# Patient Record
Sex: Female | Born: 1935
Health system: Southern US, Community
[De-identification: ages and names within clinical notes are randomized; demographics above are authoritative.]

## PROBLEM LIST (undated history)

## (undated) ENCOUNTER — Emergency Department (HOSPITAL_BASED_OUTPATIENT_CLINIC_OR_DEPARTMENT_OTHER): Admission: EM | Payer: PRIVATE HEALTH INSURANCE | Source: Home / Self Care

## (undated) DIAGNOSIS — F4321 Adjustment disorder with depressed mood: Secondary | ICD-10-CM

## (undated) DIAGNOSIS — I351 Nonrheumatic aortic (valve) insufficiency: Secondary | ICD-10-CM

## (undated) DIAGNOSIS — N183 Chronic kidney disease, stage 3 unspecified: Secondary | ICD-10-CM

## (undated) DIAGNOSIS — Z9079 Acquired absence of other genital organ(s): Secondary | ICD-10-CM

## (undated) DIAGNOSIS — I10 Essential (primary) hypertension: Secondary | ICD-10-CM

## (undated) DIAGNOSIS — C50919 Malignant neoplasm of unspecified site of unspecified female breast: Secondary | ICD-10-CM

## (undated) DIAGNOSIS — H409 Unspecified glaucoma: Secondary | ICD-10-CM

## (undated) DIAGNOSIS — M810 Age-related osteoporosis without current pathological fracture: Secondary | ICD-10-CM

## (undated) DIAGNOSIS — E785 Hyperlipidemia, unspecified: Secondary | ICD-10-CM

## (undated) DIAGNOSIS — Z9889 Other specified postprocedural states: Secondary | ICD-10-CM

## (undated) DIAGNOSIS — M7120 Synovial cyst of popliteal space [Baker], unspecified knee: Secondary | ICD-10-CM

## (undated) HISTORY — DX: Synovial cyst of popliteal space (Baker), unspecified knee: M71.20

## (undated) HISTORY — DX: Essential (primary) hypertension: I10

## (undated) HISTORY — DX: Adjustment disorder with depressed mood: F43.21

## (undated) HISTORY — DX: Malignant neoplasm of unspecified site of unspecified female breast: C50.919

## (undated) HISTORY — DX: Hyperlipidemia, unspecified: E78.5

## (undated) HISTORY — DX: Chronic kidney disease, stage 3 unspecified: N18.30

## (undated) HISTORY — PX: ABDOMINAL HYSTERECTOMY: SHX81

## (undated) HISTORY — PX: BLADDER REPAIR: SHX76

## (undated) HISTORY — DX: Acquired absence of other genital organ(s): Z90.79

## (undated) HISTORY — DX: Unspecified glaucoma: H40.9

## (undated) HISTORY — DX: Other specified postprocedural states: Z98.890

## (undated) HISTORY — DX: Age-related osteoporosis without current pathological fracture: M81.0

## (undated) HISTORY — DX: Nonrheumatic aortic (valve) insufficiency: I35.1

---

## 1991-09-29 DIAGNOSIS — C50919 Malignant neoplasm of unspecified site of unspecified female breast: Secondary | ICD-10-CM

## 1991-09-29 HISTORY — DX: Malignant neoplasm of unspecified site of unspecified female breast: C50.919

## 1991-09-29 HISTORY — PX: BREAST SURGERY: SHX581

## 1998-10-12 ENCOUNTER — Emergency Department (HOSPITAL_COMMUNITY): Admission: EM | Admit: 1998-10-12 | Discharge: 1998-10-12 | Payer: Self-pay | Admitting: Endocrinology

## 1998-10-12 ENCOUNTER — Encounter: Payer: Self-pay | Admitting: Endocrinology

## 2004-09-08 ENCOUNTER — Ambulatory Visit: Payer: Self-pay | Admitting: Internal Medicine

## 2004-09-09 ENCOUNTER — Ambulatory Visit: Payer: Self-pay | Admitting: Internal Medicine

## 2004-10-03 ENCOUNTER — Ambulatory Visit: Payer: Self-pay | Admitting: Internal Medicine

## 2004-10-13 ENCOUNTER — Ambulatory Visit: Payer: Self-pay | Admitting: Internal Medicine

## 2005-01-12 ENCOUNTER — Ambulatory Visit: Payer: Self-pay | Admitting: Internal Medicine

## 2005-10-27 ENCOUNTER — Ambulatory Visit: Payer: Self-pay | Admitting: Internal Medicine

## 2006-11-03 ENCOUNTER — Ambulatory Visit: Payer: Self-pay | Admitting: Internal Medicine

## 2006-11-03 LAB — CONVERTED CEMR LAB
ALT: 16 units/L (ref 0–40)
AST: 24 units/L (ref 0–37)
BUN: 13 mg/dL (ref 6–23)
Cholesterol: 219 mg/dL (ref 0–200)
Creatinine, Ser: 0.8 mg/dL (ref 0.4–1.2)
Direct LDL: 137.2 mg/dL
Ferritin: 42.7 ng/mL (ref 10.0–291.0)
Glucose, Bld: 107 mg/dL — ABNORMAL HIGH (ref 70–99)
HDL: 63.8 mg/dL (ref >39.0)
Iron: 79 ug/dL (ref 42–145)
Potassium: 4.2 meq/L (ref 3.5–5.1)
Total CHOL/HDL Ratio: 3.4
Triglycerides: 84 mg/dL (ref 0–149)
VLDL: 17 mg/dL (ref 0–40)

## 2006-11-10 ENCOUNTER — Ambulatory Visit: Payer: Self-pay | Admitting: Internal Medicine

## 2006-11-19 ENCOUNTER — Ambulatory Visit: Payer: Self-pay | Admitting: Internal Medicine

## 2006-12-28 ENCOUNTER — Ambulatory Visit: Payer: Self-pay | Admitting: Internal Medicine

## 2006-12-28 LAB — CONVERTED CEMR LAB
ALT: 12 units/L (ref 0–40)
AST: 18 units/L (ref 0–37)
Cholesterol: 252 mg/dL (ref 0–200)
Direct LDL: 175.6 mg/dL
HDL: 56.8 mg/dL (ref >39.0)
Total CHOL/HDL Ratio: 4.4
Triglycerides: 93 mg/dL (ref 0–149)
VLDL: 19 mg/dL (ref 0–40)

## 2007-01-10 ENCOUNTER — Ambulatory Visit: Payer: Self-pay | Admitting: Internal Medicine

## 2007-02-14 ENCOUNTER — Ambulatory Visit: Payer: Self-pay | Admitting: Internal Medicine

## 2007-02-14 LAB — CONVERTED CEMR LAB
ALT: 16 units/L (ref 0–40)
AST: 19 units/L (ref 0–37)
Albumin: 3.5 g/dL (ref 3.5–5.2)
Alkaline Phosphatase: 87 units/L (ref 39–117)
Bilirubin, Direct: 0.1 mg/dL (ref 0.0–0.3)
Cholesterol: 203 mg/dL (ref 0–200)
Direct LDL: 117.7 mg/dL
HDL: 58.2 mg/dL (ref >39.0)
Total Bilirubin: 0.9 mg/dL (ref 0.3–1.2)
Total CHOL/HDL Ratio: 3.5
Total Protein: 6.9 g/dL (ref 6.0–8.3)
Triglycerides: 102 mg/dL (ref 0–149)
VLDL: 20 mg/dL (ref 0–40)

## 2007-06-07 ENCOUNTER — Encounter: Payer: Self-pay | Admitting: Internal Medicine

## 2007-06-07 DIAGNOSIS — Z9889 Other specified postprocedural states: Secondary | ICD-10-CM

## 2007-06-07 DIAGNOSIS — H409 Unspecified glaucoma: Secondary | ICD-10-CM | POA: Insufficient documentation

## 2007-06-07 DIAGNOSIS — Z9079 Acquired absence of other genital organ(s): Secondary | ICD-10-CM | POA: Insufficient documentation

## 2007-06-07 DIAGNOSIS — E785 Hyperlipidemia, unspecified: Secondary | ICD-10-CM

## 2007-06-07 DIAGNOSIS — I10 Essential (primary) hypertension: Secondary | ICD-10-CM

## 2007-06-07 DIAGNOSIS — C50919 Malignant neoplasm of unspecified site of unspecified female breast: Secondary | ICD-10-CM | POA: Insufficient documentation

## 2007-06-07 HISTORY — DX: Acquired absence of other genital organ(s): Z90.79

## 2007-06-07 HISTORY — DX: Essential (primary) hypertension: I10

## 2007-06-07 HISTORY — DX: Other specified postprocedural states: Z98.890

## 2007-06-07 HISTORY — DX: Hyperlipidemia, unspecified: E78.5

## 2007-06-07 HISTORY — DX: Unspecified glaucoma: H40.9

## 2007-06-30 ENCOUNTER — Ambulatory Visit: Payer: Self-pay | Admitting: Vascular Surgery

## 2007-06-30 ENCOUNTER — Emergency Department (HOSPITAL_COMMUNITY): Admission: EM | Admit: 2007-06-30 | Discharge: 2007-06-30 | Payer: Self-pay | Admitting: Emergency Medicine

## 2007-09-02 ENCOUNTER — Encounter: Payer: Self-pay | Admitting: Internal Medicine

## 2007-09-08 ENCOUNTER — Encounter: Payer: Self-pay | Admitting: Internal Medicine

## 2008-03-23 ENCOUNTER — Telehealth: Payer: Self-pay | Admitting: Internal Medicine

## 2008-03-23 ENCOUNTER — Encounter: Payer: Self-pay | Admitting: Internal Medicine

## 2008-04-02 ENCOUNTER — Telehealth: Payer: Self-pay | Admitting: Internal Medicine

## 2008-04-12 ENCOUNTER — Ambulatory Visit: Payer: Self-pay | Admitting: Internal Medicine

## 2008-04-12 DIAGNOSIS — H6120 Impacted cerumen, unspecified ear: Secondary | ICD-10-CM

## 2008-04-12 LAB — CONVERTED CEMR LAB
ALT: 18 units/L (ref 0–35)
AST: 26 units/L (ref 0–37)
Albumin: 3.9 g/dL (ref 3.5–5.2)
Alkaline Phosphatase: 93 units/L (ref 39–117)
BUN: 9 mg/dL (ref 6–23)
Basophils Absolute: 0 10*3/uL (ref 0.0–0.1)
Basophils Relative: 0.6 % (ref 0.0–3.0)
Bilirubin, Direct: 0.1 mg/dL (ref 0.0–0.3)
CO2: 28 meq/L (ref 19–32)
Calcium: 9.3 mg/dL (ref 8.4–10.5)
Chloride: 102 meq/L (ref 96–112)
Cholesterol: 167 mg/dL (ref 0–200)
Creatinine, Ser: 0.8 mg/dL (ref 0.4–1.2)
Eosinophils Absolute: 0.2 10*3/uL (ref 0.0–0.7)
Eosinophils Relative: 2.7 % (ref 0.0–5.0)
GFR calc Af Amer: 89 mL/min
GFR calc non Af Amer: 74 mL/min
Glucose, Bld: 101 mg/dL — ABNORMAL HIGH (ref 70–99)
HCT: 38.3 % (ref 36.0–46.0)
HDL: 59.4 mg/dL (ref >39.0)
Hemoglobin: 12.9 g/dL (ref 12.0–15.0)
LDL Cholesterol: 94 mg/dL (ref 0–99)
Lymphocytes Relative: 28.9 % (ref 12.0–46.0)
MCHC: 33.6 g/dL (ref 30.0–36.0)
MCV: 94.6 fL (ref 78.0–100.0)
Monocytes Absolute: 0.6 10*3/uL (ref 0.1–1.0)
Monocytes Relative: 8.3 % (ref 3.0–12.0)
Neutro Abs: 4.7 10*3/uL (ref 1.4–7.7)
Neutrophils Relative %: 59.5 % (ref 43.0–77.0)
Platelets: 209 10*3/uL (ref 150–400)
Potassium: 3.6 meq/L (ref 3.5–5.1)
RBC: 4.05 M/uL (ref 3.87–5.11)
RDW: 12.2 % (ref 11.5–14.6)
Sodium: 141 meq/L (ref 135–145)
TSH: 2.99 microintl units/mL (ref 0.35–5.50)
Total Bilirubin: 1.3 mg/dL — ABNORMAL HIGH (ref 0.3–1.2)
Total CHOL/HDL Ratio: 2.8
Total Protein: 7.1 g/dL (ref 6.0–8.3)
Triglycerides: 68 mg/dL (ref 0–149)
VLDL: 14 mg/dL (ref 0–40)
WBC: 7.7 10*3/uL (ref 4.5–10.5)

## 2008-04-15 ENCOUNTER — Encounter: Payer: Self-pay | Admitting: Internal Medicine

## 2008-06-07 ENCOUNTER — Encounter: Payer: Self-pay | Admitting: Internal Medicine

## 2008-06-07 ENCOUNTER — Ambulatory Visit: Payer: Self-pay | Admitting: Internal Medicine

## 2008-06-25 ENCOUNTER — Ambulatory Visit: Payer: Self-pay | Admitting: Internal Medicine

## 2008-07-19 ENCOUNTER — Telehealth: Payer: Self-pay | Admitting: Internal Medicine

## 2008-09-03 ENCOUNTER — Encounter: Payer: Self-pay | Admitting: Internal Medicine

## 2008-10-22 ENCOUNTER — Ambulatory Visit: Payer: Self-pay | Admitting: Internal Medicine

## 2008-10-22 LAB — CONVERTED CEMR LAB
ALT: 16 units/L (ref 0–35)
AST: 20 units/L (ref 0–37)
Albumin: 3.4 g/dL — ABNORMAL LOW (ref 3.5–5.2)
Alkaline Phosphatase: 96 units/L (ref 39–117)
BUN: 12 mg/dL (ref 6–23)
Bilirubin, Direct: 0.1 mg/dL (ref 0.0–0.3)
CO2: 32 meq/L (ref 19–32)
Calcium: 9.2 mg/dL (ref 8.4–10.5)
Chloride: 109 meq/L (ref 96–112)
Cholesterol: 176 mg/dL (ref 0–200)
Creatinine, Ser: 0.7 mg/dL (ref 0.4–1.2)
GFR calc Af Amer: 104 mL/min
GFR calc non Af Amer: 86 mL/min
Glucose, Bld: 104 mg/dL — ABNORMAL HIGH (ref 70–99)
HDL: 56.6 mg/dL (ref >39.0)
LDL Cholesterol: 105 mg/dL — ABNORMAL HIGH (ref 0–99)
Potassium: 4.2 meq/L (ref 3.5–5.1)
Sodium: 144 meq/L (ref 135–145)
Total Bilirubin: 1.1 mg/dL (ref 0.3–1.2)
Total CHOL/HDL Ratio: 3.1
Total Protein: 6.7 g/dL (ref 6.0–8.3)
Triglycerides: 73 mg/dL (ref 0–149)
VLDL: 15 mg/dL (ref 0–40)

## 2008-11-01 ENCOUNTER — Telehealth: Payer: Self-pay | Admitting: Internal Medicine

## 2008-11-05 ENCOUNTER — Telehealth: Payer: Self-pay | Admitting: Internal Medicine

## 2008-11-09 ENCOUNTER — Telehealth: Payer: Self-pay | Admitting: Internal Medicine

## 2008-11-10 ENCOUNTER — Encounter: Payer: Self-pay | Admitting: Internal Medicine

## 2009-03-25 ENCOUNTER — Telehealth: Payer: Self-pay | Admitting: Internal Medicine

## 2009-03-26 ENCOUNTER — Ambulatory Visit: Payer: Self-pay | Admitting: Internal Medicine

## 2009-03-26 DIAGNOSIS — F4321 Adjustment disorder with depressed mood: Secondary | ICD-10-CM

## 2009-03-26 HISTORY — DX: Adjustment disorder with depressed mood: F43.21

## 2009-04-05 ENCOUNTER — Ambulatory Visit: Payer: Self-pay | Admitting: Licensed Clinical Social Worker

## 2009-04-19 ENCOUNTER — Ambulatory Visit: Payer: Self-pay | Admitting: Licensed Clinical Social Worker

## 2009-04-29 ENCOUNTER — Ambulatory Visit: Payer: Self-pay | Admitting: Internal Medicine

## 2009-05-17 ENCOUNTER — Ambulatory Visit: Payer: Self-pay | Admitting: Licensed Clinical Social Worker

## 2009-06-07 ENCOUNTER — Encounter: Payer: Self-pay | Admitting: Internal Medicine

## 2009-07-12 ENCOUNTER — Ambulatory Visit: Payer: Self-pay | Admitting: Licensed Clinical Social Worker

## 2009-07-29 ENCOUNTER — Ambulatory Visit: Payer: Self-pay | Admitting: Internal Medicine

## 2009-09-23 ENCOUNTER — Telehealth (INDEPENDENT_AMBULATORY_CARE_PROVIDER_SITE_OTHER): Payer: Self-pay | Admitting: *Deleted

## 2009-09-28 HISTORY — PX: EYE SURGERY: SHX253

## 2009-10-02 ENCOUNTER — Ambulatory Visit: Payer: Self-pay | Admitting: Internal Medicine

## 2009-10-21 ENCOUNTER — Telehealth: Payer: Self-pay | Admitting: Internal Medicine

## 2009-11-04 ENCOUNTER — Telehealth: Payer: Self-pay | Admitting: Internal Medicine

## 2010-01-30 ENCOUNTER — Telehealth (INDEPENDENT_AMBULATORY_CARE_PROVIDER_SITE_OTHER): Payer: Self-pay | Admitting: *Deleted

## 2010-02-04 ENCOUNTER — Ambulatory Visit: Payer: Self-pay | Admitting: Internal Medicine

## 2010-02-04 LAB — CONVERTED CEMR LAB
ALT: 12 units/L (ref 0–35)
AST: 19 units/L (ref 0–37)
Albumin: 3.3 g/dL — ABNORMAL LOW (ref 3.5–5.2)
Alkaline Phosphatase: 95 units/L (ref 39–117)
Bilirubin, Direct: 0.1 mg/dL (ref 0.0–0.3)
Cholesterol: 186 mg/dL (ref 0–200)
HDL: 57.1 mg/dL (ref >39.00)
LDL Cholesterol: 120 mg/dL — ABNORMAL HIGH (ref 0–99)
Total Bilirubin: 0.6 mg/dL (ref 0.3–1.2)
Total CHOL/HDL Ratio: 3
Total Protein: 5.9 g/dL — ABNORMAL LOW (ref 6.0–8.3)
Triglycerides: 47 mg/dL (ref 0.0–149.0)
VLDL: 9.4 mg/dL (ref 0.0–40.0)

## 2010-02-10 ENCOUNTER — Encounter: Payer: Self-pay | Admitting: Internal Medicine

## 2010-02-17 ENCOUNTER — Telehealth: Payer: Self-pay | Admitting: Internal Medicine

## 2010-02-19 ENCOUNTER — Telehealth: Payer: Self-pay | Admitting: Internal Medicine

## 2010-02-28 ENCOUNTER — Telehealth: Payer: Self-pay | Admitting: Internal Medicine

## 2010-03-10 ENCOUNTER — Telehealth: Payer: Self-pay | Admitting: Internal Medicine

## 2010-04-08 ENCOUNTER — Telehealth: Payer: Self-pay | Admitting: Internal Medicine

## 2010-04-20 ENCOUNTER — Emergency Department (HOSPITAL_COMMUNITY): Admission: EM | Admit: 2010-04-20 | Discharge: 2010-04-20 | Payer: Self-pay | Admitting: Emergency Medicine

## 2010-04-21 ENCOUNTER — Ambulatory Visit: Payer: Self-pay | Admitting: Internal Medicine

## 2010-08-20 ENCOUNTER — Telehealth: Payer: Self-pay | Admitting: Internal Medicine

## 2010-08-20 ENCOUNTER — Ambulatory Visit: Payer: Self-pay | Admitting: Internal Medicine

## 2010-09-25 ENCOUNTER — Encounter: Payer: Self-pay | Admitting: Internal Medicine

## 2010-09-25 LAB — HM MAMMOGRAPHY: HM Mammogram: NORMAL

## 2010-10-03 ENCOUNTER — Telehealth: Payer: Self-pay | Admitting: Internal Medicine

## 2010-10-28 NOTE — Assessment & Plan Note (Signed)
Summary: PER PT JAN YEARLY  STC   Vital Signs:  Patient profile:   77 year old female Height:      62 inches Weight:      149 pounds BMI:     27.35 O2 Sat:      95 % on Room air Temp:     97.9 degrees F oral Pulse rate:   66 / minute BP sitting:   144 / 70  (right arm) Cuff size:   regular  Vitals Entered By: Bill Salinas CMA (October 02, 2009 10:12 AM)  O2 Flow:  Room air CC: pt here for yearly office visit, she just had her mammogram on Dec 28,2010 and is due for her tetanus shot.  Pt is sch for cateract surg on her left eye with Dr Charlotte Sanes ab   Primary Care Provider:  Jacques Navy MD  CC:  pt here for yearly office visit, she just had her mammogram on Dec 28, and 2010 and is due for her tetanus shot.  Pt is sch for cateract surg on her left eye with Dr Charlotte Sanes ab.  History of Present Illness: Patient presents for a general physical exam. She has no specific complaints and is feeling well. She has been doing well off Sertraline. she reports myalgias and arthralgias associated with lipitor.  Current Medications (verified): 1)  Xalatan 0.005 %  Soln (Latanoprost) .... Take At Bedtime 2)  Icaps Mv   Tabs (Multiple Vitamins-Minerals) .... Take 2 Tablets Twice Daily 3)  Calcium 1000mg  .... Take One Tablet Daily 4)  Fish Oil 1000 Mg  Caps (Omega-3 Fatty Acids) .... Take One Tablet Daily 5)  Garlic 1000 Mg  Caps (Garlic) .... Take One Tablet Daily 6)  Lipitor 40 Mg  Tabs (Atorvastatin Calcium) .... Take 1/2  Tab By Mouth At Bedtime 7)  Benicar 40 Mg  Tabs (Olmesartan Medoxomil) .Marland Kitchen.. 1 By Mouth Once Daily 8)  Furosemide 40 Mg  Tabs (Furosemide) .... 1/2 By Mouth Once Daily. For Blood Pressure 9)  Toprol Xl 25 Mg Xr24h-Tab (Metoprolol Succinate) .Marland Kitchen.. 1 By Mouth Once Daily 10)  Amlodipine Besylate 5 Mg Tabs (Amlodipine Besylate) .Marland Kitchen.. 1 By Mouth Once Daily  Allergies (verified): No Known Drug Allergies  Past History:  Past Medical History: Last updated: April 24, 2008 Hx of  CARCINOMA, BREAST, LEFT (ICD-174.9)-'93 HYPERTENSION (ICD-401.9) HYPERLIPIDEMIA (ICD-272.4) GLAUCOMA (ICD-365.9)  Past Surgical History: Last updated: 2008-04-24 LUMPECTOMY, BREAST, HX OF (ICD-V15.2)-'93 BLADDER REPAIR, HX OF (ICD-V15.2)-remote HYSTERECTOMY, HX OF (ICD-V45.77)-remote     Family History: Last updated: 04/24/2008 father - deceased @55 : suicide/depression mother- deceased @ 55: HTN, Lipid,  CAD/MI, cervical cancer Neg-breast or colon cancer; DM  Social History: Last updated: 04/24/08 married '35  2 sons - '25,'65 oldest died CAD/MI; 2 grandchildren lives with husband and son lives at home. little gardening, handcrafts. End-of-Life Issues: does not want DPR, mechanical ventilation or heroic measures. She has a living will.  Review of Systems  The patient denies anorexia, fever, weight loss, weight gain, vision loss, decreased hearing, hoarseness, chest pain, dyspnea on exertion, peripheral edema, headaches, hemoptysis, abdominal pain, melena, severe indigestion/heartburn, incontinence, muscle weakness, transient blindness, difficulty walking, unusual weight change, enlarged lymph nodes, and angioedema.         for cataract surgery 10/17/09. Has urgency. Is concerned about possible cystocele. Has diffuse myalgias/arthralgias related to taking lipitor  Physical Exam  General:  Pleasant elderly white female in no distress Head:  Normocephalic and atraumatic without obvious abnormalities. No apparent alopecia  or balding. Eyes:  vision grossly intact, pupils equal, pupils round, corneas and lenses clear, and no injection.   Ears:  mild amount of cerumen, TMs normal Nose:  External nasal examination shows no deformity or inflammation. Nasal mucosa are pink and moist without lesions or exudates. Mouth:  Oral mucosa and oropharynx without lesions or exudates.  Teeth in good repair. Neck:  full ROM, no thyromegaly, and no carotid bruits.   Chest Wall:  No deformities,  masses, or tenderness noted. Breasts:  elipitical scar left breast upper quadrant, fibrocystic changes that are mild, no fixed mass or lesion.  Lungs:  Normal respiratory effort, chest expands symmetrically. Lungs are clear to auscultation, no crackles or wheezes. Heart:  Normal rate and regular rhythm. S1 and S2 normal without gallop, murmur, click, rub or other extra sounds. Abdomen:  soft, non-tender, normal bowel sounds, no distention, and no hepatomegaly.   Genitalia:  deferred Msk:  normal ROM, no joint tenderness, no joint swelling, no joint warmth, no redness over joints, no joint deformities, and no joint instability.   Pulses:  2+ radial  Extremities:  No clubbing, cyanosis, edema, or deformity noted with normal full range of motion of all joints.   Neurologic:  No cranial nerve deficits noted. Station and gait are normal. Plantar reflexes are down-going bilaterally. DTRs are symmetrical throughout. Sensory, motor and coordinative functions appear intact. Skin:  scab on right forearm. Aging changes to skin but good turgor, No ulcerations.  Cervical Nodes:  no anterior cervical adenopathy and no posterior cervical adenopathy.   Axillary Nodes:  no R axillary adenopathy and no L axillary adenopathy.   Psych:  Oriented X3, memory intact for recent and remote, normally interactive, good eye contact, and not anxious appearing.     Impression & Recommendations:  Problem # 1:  DEPRESSION, SITUATIONAL (ICD-309.0) She is doing well off of sertraline. No further treatment is needed.  Problem # 2:  HYPERTENSION (ICD-401.9)  Her updated medication list for this problem includes:    Benicar 40 Mg Tabs (Olmesartan medoxomil) .Marland Kitchen... 1 by mouth once daily    Furosemide 40 Mg Tabs (Furosemide) .Marland Kitchen... 1/2 by mouth once daily. for blood pressure    Toprol Xl 25 Mg Xr24h-tab (Metoprolol succinate) .Marland Kitchen... 1 by mouth once daily    Amlodipine Besylate 5 Mg Tabs (Amlodipine besylate) .Marland Kitchen... 1 by mouth once  daily  BP today: 144/70 Prior BP: 136/68 (07/29/2009)  Prior 10 Yr Risk Heart Disease: 9 % (06/25/2008)  Labs Reviewed: K+: 4.2 (10/22/2008)  Adequate control. She will continue present medications. I have asked for home readings to complete her evaluation.  Problem # 3:  HYPERLIPIDEMIA (ICD-272.4) Having myalgias and arthralgias which she associates with medication: her symptoms are less on 1/2 dose  Plan - stop lipitor - if symptoms resolve will change to crestor10mg  daily with lab to follow Her updated medication list for this problem includes:    Lipitor 40 Mg Tabs (Atorvastatin calcium) .Marland Kitchen... Take 1/2  tab by mouth at bedtime  Problem # 4:  Preventive Health Care (ICD-V70.0) Normal physical. Lab work in recent past was fine. Will delay lab per #3. She is current with breast cancer screeening and colorectal cancer screening. She is current with flu and pneumonia vaccince.  In summary a very nice woman who is medically stable. She will have lab work after we settle on an antilipemic she can tolerate.  Complete Medication List: 1)  Xalatan 0.005 % Soln (Latanoprost) .... Take at bedtime 2)  Icaps Mv Tabs (Multiple vitamins-minerals) .... Take 2 tablets twice daily 3)  Calcium 1000mg   .... Take one tablet daily 4)  Fish Oil 1000 Mg Caps (Omega-3 fatty acids) .... Take one tablet daily 5)  Garlic 1000 Mg Caps (Garlic) .... Take one tablet daily 6)  Lipitor 40 Mg Tabs (Atorvastatin calcium) .... Take 1/2  tab by mouth at bedtime 7)  Benicar 40 Mg Tabs (Olmesartan medoxomil) .Marland Kitchen.. 1 by mouth once daily 8)  Furosemide 40 Mg Tabs (Furosemide) .... 1/2 by mouth once daily. for blood pressure 9)  Toprol Xl 25 Mg Xr24h-tab (Metoprolol succinate) .Marland Kitchen.. 1 by mouth once daily 10)  Amlodipine Besylate 5 Mg Tabs (Amlodipine besylate) .Marland Kitchen.. 1 by mouth once daily   Preventive Care Screening  Mammogram:    Date:  09/24/2009    Results:  normal

## 2010-10-28 NOTE — Progress Notes (Signed)
Summary: MED ?  Phone Note Call from Patient Call back at [facility] Phone [phone]   Summary of Call: Pt requesting a call back.  Initial call taken by: Lamar Sprinkles, CMA,  February 28, 2010 1:50 PM  Follow-up for Phone Call        Pt questioned last rx sent in to [facility], informed Follow-up by: Lamar Sprinkles, CMA,  February 28, 2010 4:35 PM

## 2010-10-28 NOTE — Progress Notes (Signed)
Summary: med refill  Phone Note Call from Patient Call back at Home Phone [phone]   Summary of Call: Patient requests a call about med refills. Initial call taken by: Lucious Groves,  March 10, 2010 10:28 AM  Follow-up for Phone Call        Patient is trying to receive Crestor and Amlodipine from [facility]. Patient aware we will send new prescriptions today. Follow-up by: Lucious Groves,  March 10, 2010 10:46 AM    Prescriptions: AMLODIPINE BESYLATE 5 MG TABS (AMLODIPINE BESYLATE) 1 by mouth once daily  #90 x 3   Entered by:   Lucious Groves   Authorized by:   Jacques Navy MD   Signed by:   Lucious Groves on 03/10/2010   Method used:   Faxed to ...       [facility] (mail-order)             , [place]         Ph:        Fax: [phone]   RxID:   [id number] CRESTOR 5 MG TABS (ROSUVASTATIN CALCIUM) one daily  #90 x 3   Entered by:   Lucious Groves   Authorized by:   Jacques Navy MD   Signed by:   Lucious Groves on 03/10/2010   Method used:   Faxed to ...       [facility] (mail-order)             , [place]         Ph:        Fax: [phone]   RxID:   [id number]

## 2010-10-28 NOTE — Assessment & Plan Note (Signed)
Summary: post hosp - Amy Turner/cd   Vital Signs:  Patient profile:   77 year old female Height:      62 inches Weight:      151 pounds BMI:     27.72 O2 Sat:      96 % on Room air Temp:     97.9 degrees F oral Pulse rate:   65 / minute BP sitting:   140 / 72  (right arm) Cuff size:   regular  Vitals Entered By: Bill Salinas CMA (April 21, 2010 11:08 AM)  O2 Flow:  Room air CC: ER follow up/ ab   Primary Care Provider:  Jacques Navy MD  CC:  ER follow up/ ab.  History of Present Illness: Awoke yesterday AM and had a tingling in her arm, like something bit her. she felt bad,returned to bed until noon. Her arm became very swollen and red. She went to [facility] ED for evaluation. She was seen by Ms Alto Denver, PA-C and diagnosed with cellulitis vs reaction to insect envenomation. She was started on prednisone and doxycycline 100mg  two times a day.  Her arm is much better today. She has had no respiratory symptoms. She has had no fever or chills. She is taking medication as prescribed.   Current Medications (verified): 1)  Xalatan 0.005 %  Soln (Latanoprost) .... Take At Bedtime 2)  Icaps Mv   Tabs (Multiple Vitamins-Minerals) .... Take 2 Tablets Twice Daily 3)  Calcium 1000mg  .... Take One Tablet Daily 4)  Fish Oil 1000 Mg  Caps (Omega-3 Fatty Acids) .... Take One Tablet Daily 5)  Garlic 1000 Mg  Caps (Garlic) .... Take One Tablet Daily 6)  Crestor 5 Mg Tabs (Rosuvastatin Calcium) .... One Daily 7)  Benicar 40 Mg  Tabs (Olmesartan Medoxomil) .Marland Kitchen.. 1 By Mouth Once Daily 8)  Furosemide 40 Mg  Tabs (Furosemide) .... 1/2 By Mouth Once Daily. For Blood Pressure 9)  Toprol Xl 25 Mg Xr24h-Tab (Metoprolol Succinate) .Marland Kitchen.. 1 By Mouth Once Daily 10)  Amlodipine Besylate 5 Mg Tabs (Amlodipine Besylate) .Marland Kitchen.. 1 By Mouth Once Daily 11)  Prednisone 20 Mg Tabs (Prednisone) .... 2 Tabs Once Daily X 5 Days 12)  Doxycycline Hyclate 100 Mg Solr (Doxycycline Hyclate) .Marland Kitchen.. 1 Tab Once Daily X 7 Days  Allergies  (verified): No Known Drug Allergies  Past History:  Past Medical History: Last updated: 04/12/2008 Hx of CARCINOMA, BREAST, LEFT (ICD-174.9)-'93 HYPERTENSION (ICD-401.9) HYPERLIPIDEMIA (ICD-272.4) GLAUCOMA (ICD-365.9)  Past Surgical History: Last updated: 04/12/2008 LUMPECTOMY, BREAST, HX OF (ICD-V15.2)-'93 BLADDER REPAIR, HX OF (ICD-V15.2)-remote HYSTERECTOMY, HX OF (ICD-V45.77)-remote    PSH reviewed for relevance, FH reviewed for relevance  Review of Systems  The patient denies anorexia, fever, weight loss, weight gain, chest pain, syncope, dyspnea on exertion, prolonged cough, abdominal pain, hematochezia, incontinence, muscle weakness, transient blindness, difficulty walking, abnormal bleeding, and angioedema.    Physical Exam  General:  Well-developed,well-nourished,in no acute distress; alert,appropriate and cooperative throughout examination Lungs:  normal respiratory effort and normal breath sounds.   Heart:  normal rate and regular rhythm.   Skin:  Left arm without heat or tenderness, no spread of erythema beyond ink line. Minimal swelling whcih is chronic.    Impression & Recommendations:  Problem # 1:  ARM PAIN, LEFT (ICD-729.5) envenomation vs cellulits. Her arm looks much better today.  Plan - another day of prednisone           complete doxy.   Complete Medication List: 1)  Xalatan 0.005 %  Soln (Latanoprost) .... Take at bedtime 2)  Icaps Mv Tabs (Multiple vitamins-minerals) .... Take 2 tablets twice daily 3)  Calcium 1000mg   .... Take one tablet daily 4)  Fish Oil 1000 Mg Caps (Omega-3 fatty acids) .... Take one tablet daily 5)  Garlic 1000 Mg Caps (Garlic) .... Take one tablet daily 6)  Crestor 5 Mg Tabs (Rosuvastatin calcium) .... One daily 7)  Benicar 40 Mg Tabs (Olmesartan medoxomil) .Marland Kitchen.. 1 by mouth once daily 8)  Furosemide 40 Mg Tabs (Furosemide) .... 1/2 by mouth once daily. for blood pressure 9)  Toprol Xl 25 Mg Xr24h-tab (Metoprolol  succinate) .Marland Kitchen.. 1 by mouth once daily 10)  Amlodipine Besylate 5 Mg Tabs (Amlodipine besylate) .Marland Kitchen.. 1 by mouth once daily 11)  Prednisone 20 Mg Tabs (Prednisone) .... 2 tabs once daily x 5 days 12)  Doxycycline Hyclate 100 Mg Solr (Doxycycline hyclate) .Marland Kitchen.. 1 tab once daily x 7 days  Patient Instructions: 1)  Arm Pain - envenomation from spider or insect bite with allergic reaction vs infection of the skin. Plan - take prednisone as directed today. If there is no recurrent redness or swelling ok to stop. Finish the doxycycline antibiotic.

## 2010-10-28 NOTE — Progress Notes (Signed)
Summary: OV TODAY  Phone Note Call from Patient   Summary of Call: Pt was bitten by house cat on her arm. C/o redness, swelling & pain. office visit today per MD. Pt aware.  Initial call taken by: Lamar Sprinkles, CMA,  August 20, 2010 9:31 AM

## 2010-10-28 NOTE — Progress Notes (Signed)
Summary: CALL  Phone Note Call from Patient   Summary of Call: Patient is requesting a call back regarding a medication.  Initial call taken by: Lamar Sprinkles, CMA,  November 04, 2009 10:17 AM  Follow-up for Phone Call        Pt has been taking crestor 5mg  and says she does not have any pain like when on lipitor. Refill sent in.  Follow-up by: Lamar Sprinkles, CMA,  November 04, 2009 4:51 PM  Additional Follow-up for Phone Call Additional follow up Details #1::        k    Prescriptions: CRESTOR 5 MG TABS (ROSUVASTATIN CALCIUM) one daily  #30 x 5   Entered by:   Lamar Sprinkles, CMA   Authorized by:   Jacques Navy MD   Signed by:   Lamar Sprinkles, CMA on 11/04/2009   Method used:   Electronically to        [facility]* (retail)       [address] [place], [place]  [address]       Ph: [phone] or [phone]       Fax: [phone]   RxID:   [id number]

## 2010-10-28 NOTE — Assessment & Plan Note (Signed)
Summary: cat bite on arm/SD   Vital Signs:  Patient profile:   77 year old female Height:      62 inches Weight:      150 pounds BMI:     27.53 O2 Sat:      94 % on Room air Temp:     98.3 degrees F oral Pulse rate:   69 / minute BP sitting:   142 / 72  (left arm) Cuff size:   regular  Vitals Entered By: Bill Salinas CMA (August 20, 2010 11:23 AM)  O2 Flow:  Room air CC: pt here for evaluation of cat bite on her right arm/ ab   Primary Care Provider:  Jacques Navy MD  CC:  pt here for evaluation of cat bite on her right arm/ ab.  History of Present Illness: Patient sustained a cat bite to the right forearm 24 hours ago - puncture wound. The forearm has become red, warm, and tender. She has had no fever or chills, no streaking of her arm, no pain or tenderness in the axilla. This was her cat and there is no risk of rabies.   Current Medications (verified): 1)  Xalatan 0.005 %  Soln (Latanoprost) .... Take At Bedtime 2)  Icaps Mv   Tabs (Multiple Vitamins-Minerals) .... Take 2 Tablets Twice Daily 3)  Calcium 1000mg  .... Take One Tablet Daily 4)  Fish Oil 1000 Mg  Caps (Omega-3 Fatty Acids) .... Take One Tablet Daily 5)  Garlic 1000 Mg  Caps (Garlic) .... Take One Tablet Daily 6)  Crestor 5 Mg Tabs (Rosuvastatin Calcium) .... One Daily 7)  Benicar 40 Mg  Tabs (Olmesartan Medoxomil) .Marland Kitchen.. 1 By Mouth Once Daily 8)  Furosemide 40 Mg  Tabs (Furosemide) .... 1/2 By Mouth Once Daily. For Blood Pressure 9)  Toprol Xl 25 Mg Xr24h-Tab (Metoprolol Succinate) .Marland Kitchen.. 1 By Mouth Once Daily 10)  Amlodipine Besylate 5 Mg Tabs (Amlodipine Besylate) .Marland Kitchen.. 1 By Mouth Once Daily  Allergies (verified): No Known Drug Allergies  Past History:  Past Medical History: Last updated: 04/12/2008 Hx of CARCINOMA, BREAST, LEFT (ICD-174.9)-'93 HYPERTENSION (ICD-401.9) HYPERLIPIDEMIA (ICD-272.4) GLAUCOMA (ICD-365.9)  Past Surgical History: Last updated: 04/12/2008 LUMPECTOMY, BREAST, HX OF  (ICD-V15.2)-'93 BLADDER REPAIR, HX OF (ICD-V15.2)-remote HYSTERECTOMY, HX OF (ICD-V45.77)-remote    FH reviewed for relevance, SH/Risk Factors reviewed for relevance  Review of Systems       The patient complains of fever.  The patient denies anorexia, weight loss, weight gain, chest pain, dyspnea on exertion, headaches, muscle weakness, abnormal bleeding, enlarged lymph nodes, and angioedema.    Physical Exam  General:  Well-developed,well-nourished,in no acute distress; alert,appropriate and cooperative throughout examination Head:  normocephalic and atraumatic.   Lungs:  normal respiratory effort.   Heart:  normal rate and regular rhythm.   Msk:  no joint tenderness, no joint swelling, and no joint warmth.   Pulses:  2+ radial Neurologic:  alert & oriented X3, strength normal in all extremities, sensation intact to light touch, sensation intact to pinprick, and gait normal.   Skin:  erythema and mild swelling of the right forearm, tender to touch. Axillary Nodes:  no R axillary adenopathy.     Impression & Recommendations:  Problem # 1:  CELLULITIS AND ABSCESS OF UPPER ARM AND FOREARM (ICD-682.3) Secondary to cat bite/puncture wound. Rap;idly developing infection.  Plan - doxycyline 100mg  two times a day x 7           apply heat  for progressive spread of erythema, palpable lymph nodes or fever will need reassessment.  The following medications were removed from the medication list:    Doxycycline Hyclate 100 Mg Solr (Doxycycline hyclate) .Marland Kitchen... 1 tab once daily x 7 days Her updated medication list for this problem includes:    Doxycycline Hyclate 100 Mg Tabs (Doxycycline hyclate) .Marland Kitchen... 1 by mouth two times a day x 10 days for cat bite infection  Complete Medication List: 1)  Xalatan 0.005 % Soln (Latanoprost) .... Take at bedtime 2)  Icaps Mv Tabs (Multiple vitamins-minerals) .... Take 2 tablets twice daily 3)  Calcium 1000mg   .... Take one tablet daily 4)  Fish  Oil 1000 Mg Caps (Omega-3 fatty acids) .... Take one tablet daily 5)  Garlic 1000 Mg Caps (Garlic) .... Take one tablet daily 6)  Crestor 5 Mg Tabs (Rosuvastatin calcium) .... One daily 7)  Benicar 40 Mg Tabs (Olmesartan medoxomil) .Marland Kitchen.. 1 by mouth once daily 8)  Furosemide 40 Mg Tabs (Furosemide) .... 1/2 by mouth once daily. for blood pressure 9)  Toprol Xl 25 Mg Xr24h-tab (Metoprolol succinate) .Marland Kitchen.. 1 by mouth once daily 10)  Amlodipine Besylate 5 Mg Tabs (Amlodipine besylate) .Marland Kitchen.. 1 by mouth once daily 11)  Doxycycline Hyclate 100 Mg Tabs (Doxycycline hyclate) .Marland Kitchen.. 1 by mouth two times a day x 10 days for cat bite infection Prescriptions: DOXYCYCLINE HYCLATE 100 MG TABS (DOXYCYCLINE HYCLATE) 1 by mouth two times a day x 10 days for cat bite infection  #20 x 0   Entered and Authorized by:   Jacques Navy MD   Signed by:   Jacques Navy MD on 08/20/2010   Method used:   Electronically to        [facility] [phone]* (retail)       [address] [place], [place]  [address]       Ph: [phone]       Fax: [phone]   RxID:   [id number]    Orders Added: 1)  Est. Patient Level III [16606]

## 2010-10-28 NOTE — Letter (Signed)
[facility] [address] [place], [place]  [address] Phone: [phone]      Feb 10, 2010   [facility] [address] [place], [place] [address]  RE:  LAB RESULTS  Dear  Ms. Delmundo,  The following is an interpretation of your most recent lab tests.  Please take note of any instructions provided or changes to medications that have resulted from your lab work.  Health professionals look at cholesterol as more involved than just the total cholesterol. We consider the level of LDL (bad) cholesterol, HDL (good), cholesterol, and Triglycerides (Grease) in the blood.  1. Your LDL should be under 100, and the HDL should be over 45, if you have any vascular disease such as heart attack, angina, stroke, TIA (mini stroke), claudication (pain in the legs when you walk due to poor circulation),  Abdominal Aortic Aneurysm (AAA), diabetes or prediabetes.  2. Your LDL should be under 130 if you have any two of the following:     a. Smoke or chew tobacco,     b. High blood pressure (if you are on medication or over 140/90 without medication),     c. Female gender,    d. HDL below 40,    e. A female relative (father, brother, or son), who have had any vascular event          as described in #1. above under the age of 64, or a female relative (mother,       sister, or daughter) who had an event as described above under age 23. (An HDL over 60 will subtract one risk factor from the total, so if you have two items in # 2 above, but an HDL over 60, you then fall into category # 3 below).  3. Your LDL should be under 160 if you have any one of the above.  Triglycerides should be under 200 with the ideal being under 150.  For diabetes or pre-diabetes, the ideal HgbA1C should be under 6.0%.  If you fall into any of the above categories, you should make a follow up appointment to discuss this with your physician.  LIPID PANEL:  Good - no changes needed Triglyceride: 47.0    Cholesterol: 186   LDL: 120   HDL: 57.10   Chol/HDL%:  3    Cholesterolis doing OK. Continue present treatment   Sincerely Yours,    Jacques Navy MD  t: Novella Olive Note: All result statuses are Final unless otherwise noted.  Tests: (1) Lipid Panel (LIPID)   Cholesterol               186 mg/dL                   1-027     ATP III Classification            Desirable:  < 200 mg/dL                    Borderline High:  200 - 239 mg/dL               High:  > = 240 mg/dL   Triglycerides             47.0 mg/dL                  2.5-366.4     Normal:  <150 mg/dL     Borderline High:  403 - 199 mg/dL  HDL                       42.59 mg/dL                 >56.38   VLDL Cholesterol          9.4 mg/dL                   7.5-64.3   LDL Cholesterol      [H]  329 mg/dL                   5-18  CHO/HDL Ratio:  CHD Risk                             3                    Men          Women     1/2 Average Risk     3.4          3.3     Average Risk          5.0          4.4     2X Average Risk          9.6          7.1     3X Average Risk          15.0          11.0                           Tests: (2) Hepatic/Liver Function Panel (HEPATIC)   Total Bilirubin           0.6 mg/dL                   8.4-1.6   Direct Bilirubin          0.1 mg/dL                   6.0-6.3   Alkaline Phosphatase      95 U/L                      39-117   AST                       19 U/L                      0-37   ALT                       12 U/L                      0-35   Total Protein        [L]  5.9 g/dL                    0.1-6.0   Albumin              [L]  3.3 g/dL                    1.0-9.3

## 2010-10-28 NOTE — Progress Notes (Signed)
Summary: CALL  Phone Note Call from Patient   Summary of Call: Patient is requesting a call regarding a medication.  Initial call taken by: Lamar Sprinkles, CMA,  Jan 30, 2010 11:41 AM  Follow-up for Phone Call        Pt is req to know when she is due for labs again. She has been on crestor 5mg  1 once daily since the end of Jan.  Follow-up by: Lamar Sprinkles, CMA,  Jan 30, 2010 11:47 AM  Additional Follow-up for Phone Call Additional follow up Details #1::        Last lab Jan 25th. OK for follow-up lipid panel 272.4 and hepatic 995.20 Additional Follow-up by: Jacques Navy MD,  Jan 30, 2010 1:25 PM    Additional Follow-up for Phone Call Additional follow up Details #2::    Pt informed, please put lab in idx.......................Marland KitchenLamar Sprinkles, CMA  Jan 30, 2010 5:17 PM   Additional Follow-up for Phone Call Additional follow up Details #3:: Details for Additional Follow-up Action Taken: Orders for lab in IDX. Additional Follow-up by: Verdell Face,  Jan 31, 2010 8:51 AM

## 2010-10-28 NOTE — Progress Notes (Signed)
Summary: CALL  Phone Note Call from Patient Call back at Home Phone [phone]   Summary of Call: Patient is requesting a call back.  Initial call taken by: Lamar Sprinkles, CMA,  Feb 17, 2010 11:28 AM  Follow-up for Phone Call        FYI, [facility] is going to fax requests for rf's that pt needs. Offered to get refills completed but pt did not know what she needed.  Follow-up by: Lamar Sprinkles, CMA,  Feb 17, 2010 11:54 AM

## 2010-10-28 NOTE — Progress Notes (Signed)
Summary: CHOLESTEROL MED  Phone Note Call from Patient Call back at Home Phone [phone]   Summary of Call: Pt stopped Lipitor and leg pain has resolved. Per last office note, pt is to try crestor. Correct?  Initial call taken by: Lamar Sprinkles, CMA,  October 21, 2009 10:53 AM  Follow-up for Phone Call        Yep. Crestor 5mg  once daily, may give sample if we have them Follow-up by: Jacques Navy MD,  October 21, 2009 1:06 PM  Additional Follow-up for Phone Call Additional follow up Details #1::        left mess to call office back, samples upfront, Pt informed  Additional Follow-up by: Lamar Sprinkles, CMA,  October 21, 2009 3:31 PM    Additional Follow-up for Phone Call Additional follow up Details #2::    Pt informed of samples Follow-up by: Lamar Sprinkles, CMA,  October 21, 2009 5:07 PM  New/Updated Medications: CRESTOR 5 MG TABS (ROSUVASTATIN CALCIUM) one daily Prescriptions: CRESTOR 5 MG TABS (ROSUVASTATIN CALCIUM) one daily  #21 x 0   Entered by:   Lamar Sprinkles, CMA   Authorized by:   Jacques Navy MD   Signed by:   Lamar Sprinkles, CMA on 10/21/2009   Method used:   Samples Given   RxID:   [id number]

## 2010-10-28 NOTE — Progress Notes (Signed)
Summary: LABS  Phone Note Call from Patient Call back at Home Phone [phone] Call back at [facility] vm on hm #   Summary of Call: Patient is requesting to know when her cholesterol and liver needs to be checked again?  Initial call taken by: Lamar Sprinkles, CMA,  April 08, 2010 5:19 PM  Follow-up for Phone Call        November '11 Follow-up by: Jacques Navy MD,  April 15, 2010 7:50 AM  Additional Follow-up for Phone Call Additional follow up Details #1::        lm with pts husband for pt to call back.  Additional Follow-up by: Ami Bullins CMA,  April 15, 2010 8:37 AM    Additional Follow-up for Phone Call Additional follow up Details #2::    informed pt Follow-up by: Ami Bullins CMA,  April 15, 2010 9:32 AM

## 2010-10-28 NOTE — Progress Notes (Signed)
  Phone Note Refill Request Message from:  Fax from Pharmacy on Feb 19, 2010 10:47 AM  Refills Requested: Medication #1:  TOPROL XL 25 MG XR24H-TAB 1 by mouth once daily Initial call taken by: Ami Bullins CMA,  Feb 19, 2010 10:47 AM    Prescriptions: TOPROL XL 25 MG XR24H-TAB (METOPROLOL SUCCINATE) 1 by mouth once daily  #90 x 3   Entered by:   Ami Bullins CMA   Authorized by:   Jacques Navy MD   Signed by:   Bill Salinas CMA on 02/19/2010   Method used:   Electronically to        [facility]* (mail-order)             ,          Ph: [phone]       Fax: [phone]   RxID:   [id number]

## 2010-10-30 NOTE — Progress Notes (Signed)
    Preventive Care Screening  Mammogram:    Date:  09/25/2010    Results:  normal

## 2010-11-14 ENCOUNTER — Other Ambulatory Visit: Payer: Self-pay | Admitting: Internal Medicine

## 2010-11-14 ENCOUNTER — Ambulatory Visit (INDEPENDENT_AMBULATORY_CARE_PROVIDER_SITE_OTHER): Payer: Medicare Other | Admitting: Internal Medicine

## 2010-11-14 ENCOUNTER — Encounter: Payer: Self-pay | Admitting: Internal Medicine

## 2010-11-14 ENCOUNTER — Encounter (INDEPENDENT_AMBULATORY_CARE_PROVIDER_SITE_OTHER): Payer: Self-pay | Admitting: *Deleted

## 2010-11-14 ENCOUNTER — Other Ambulatory Visit: Payer: Medicare Other

## 2010-11-14 DIAGNOSIS — I10 Essential (primary) hypertension: Secondary | ICD-10-CM

## 2010-11-14 DIAGNOSIS — Z0000 Encounter for general adult medical examination without abnormal findings: Secondary | ICD-10-CM

## 2010-11-14 DIAGNOSIS — Z23 Encounter for immunization: Secondary | ICD-10-CM

## 2010-11-14 DIAGNOSIS — F4321 Adjustment disorder with depressed mood: Secondary | ICD-10-CM

## 2010-11-14 DIAGNOSIS — E785 Hyperlipidemia, unspecified: Secondary | ICD-10-CM

## 2010-11-14 LAB — BASIC METABOLIC PANEL
CO2: 29 mEq/L (ref 19–32)
Calcium: 9.2 mg/dL (ref 8.4–10.5)
Creatinine, Ser: 0.7 mg/dL (ref 0.4–1.2)
Sodium: 143 mEq/L (ref 135–145)

## 2010-11-14 LAB — HEPATIC FUNCTION PANEL
AST: 21 U/L (ref 0–37)
Albumin: 3.6 g/dL (ref 3.5–5.2)
Total Protein: 6.1 g/dL (ref 6.0–8.3)

## 2010-11-14 LAB — LIPID PANEL
Cholesterol: 201 mg/dL — ABNORMAL HIGH (ref 0–200)
Total CHOL/HDL Ratio: 3
Triglycerides: 60 mg/dL (ref 0.0–149.0)
VLDL: 12 mg/dL (ref 0.0–40.0)

## 2010-11-14 LAB — LDL CHOLESTEROL, DIRECT: Direct LDL: 124.7 mg/dL

## 2010-11-18 ENCOUNTER — Encounter: Payer: Self-pay | Admitting: Internal Medicine

## 2010-11-18 DIAGNOSIS — M810 Age-related osteoporosis without current pathological fracture: Secondary | ICD-10-CM

## 2010-11-18 HISTORY — DX: Age-related osteoporosis without current pathological fracture: M81.0

## 2010-11-19 ENCOUNTER — Other Ambulatory Visit: Payer: Self-pay | Admitting: Internal Medicine

## 2010-11-19 ENCOUNTER — Encounter: Payer: Self-pay | Admitting: Internal Medicine

## 2010-11-19 ENCOUNTER — Ambulatory Visit (INDEPENDENT_AMBULATORY_CARE_PROVIDER_SITE_OTHER)
Admission: RE | Admit: 2010-11-19 | Discharge: 2010-11-19 | Disposition: A | Discharge status: Home / Self Care | Payer: PRIVATE HEALTH INSURANCE | Source: Ambulatory Visit | Attending: Internal Medicine | Admitting: Internal Medicine

## 2010-11-19 ENCOUNTER — Other Ambulatory Visit: Payer: PRIVATE HEALTH INSURANCE

## 2010-11-19 DIAGNOSIS — M810 Age-related osteoporosis without current pathological fracture: Secondary | ICD-10-CM

## 2010-11-25 NOTE — Assessment & Plan Note (Signed)
Summary: FOLLOW UP/ MAY NEED LABS TO CK CHOLESTEROL/sls   Vital Signs:  Patient profile:   77 year old female Height:      59.75 inches Weight:      152.50 pounds BMI:     30.14 O2 Sat:      98 % on Room air Temp:     97.5 degrees F oral Pulse rate:   68 / minute Resp:     14 per minute BP sitting:   138 / 78  (left arm)  Vitals Entered By: Burnard Leigh CMA(AAMA) (November 14, 2010 9:06 AM)  O2 Flow:  Room air CC: F/U on Cholesterol med & Labs possibly needed/sls,cma Is Patient Diabetic? No Comments Last Yearly CPE January 2011.   Primary Care Provider:  Jacques Navy MD  CC:  F/U on Cholesterol med & Labs possibly needed/sls and cma.  History of Present Illness: Presents for a medicare wellness exam. She has been stable and doing well. She has no specific medical complaints.  She is 100% in all ADLs. She has had no falls and has no increased fall risk. She is cautious. She has no signs or symptoms of current depression. She is cognitively intact: socially active and manages her household.   Preventive Screening-Counseling & Management  Alcohol-Tobacco     Alcohol drinks/day: 0     Smoking Status: never  Caffeine-Diet-Exercise     Caffeine use/day: none     Diet Comments: healthy diet     Does Patient Exercise: yes     Type of exercise: walking     Exercise (avg: min/session): 30-60     Times/week: 5  Hep-HIV-STD-Contraception     Hepatitis Risk: no risk noted     HIV Risk: no risk noted     STD Risk: no risk noted     Dental Visit-last 6 months yes     SBE monthly: no     Sun Exposure-Excessive: yes  Safety-Violence-Falls     Seat Belt Use: yes     Helmet Use: n/a     Firearms in the Home: firearms in the home     Smoke Detectors: yes     Violence in the Home: no risk noted     Sexual Abuse: no      Sexual History:  currently monogamous.        Drug Use:  never.        Blood Transfusions:  no.    Current Medications (verified): 1)  Xalatan  0.005 %  Soln (Latanoprost) .... Take At Bedtime 2)  Icaps Mv   Tabs (Multiple Vitamins-Minerals) .... Take 2 Tablets Twice Daily 3)  Calcium 1000mg  .... Take One Tablet Daily 4)  Fish Oil 1000 Mg  Caps (Omega-3 Fatty Acids) .... Take One Tablet Daily 5)  Garlic 1000 Mg  Caps (Garlic) .... Take One Tablet Daily 6)  Crestor 5 Mg Tabs (Rosuvastatin Calcium) .... One Daily 7)  Benicar 40 Mg  Tabs (Olmesartan Medoxomil) .Marland Kitchen.. 1 By Mouth Once Daily 8)  Furosemide 40 Mg  Tabs (Furosemide) .... 1/2 By Mouth Once Daily. For Blood Pressure 9)  Toprol Xl 25 Mg Xr24h-Tab (Metoprolol Succinate) .Marland Kitchen.. 1 By Mouth Once Daily 10)  Amlodipine Besylate 5 Mg Tabs (Amlodipine Besylate) .Marland Kitchen.. 1 By Mouth Once Daily  Allergies (verified): No Known Drug Allergies  Past History:  Past Medical History: Last updated: 04/12/2008 Hx of CARCINOMA, BREAST, LEFT (ICD-174.9)-'93 HYPERTENSION (ICD-401.9) HYPERLIPIDEMIA (ICD-272.4) GLAUCOMA (ICD-365.9)  Past Surgical History: Last  updated: 04-17-2008 LUMPECTOMY, BREAST, HX OF (ICD-V15.2)-'93 BLADDER REPAIR, HX OF (ICD-V15.2)-remote HYSTERECTOMY, HX OF (ICD-V45.77)-remote     Family History: Last updated: 04/17/08 father - deceased @55 : suicide/depression mother- deceased @ 58: HTN, Lipid,  CAD/MI, cervical cancer Neg-breast or colon cancer; DM  Social History: Last updated: 04-17-08 married '35  2 sons - '61,'65 oldest died CAD/MI; 2 grandchildren lives with husband and son lives at home. little gardening, handcrafts. End-of-Life Issues: does not want DPR, mechanical ventilation or heroic measures. She has a living will.  Social History: Caffeine use/day:  none Hepatitis Risk:  no risk noted HIV Risk:  no risk noted STD Risk:  no risk noted Dental Care w/in 6 mos.:  yes Sun Exposure-Excessive:  yes Seat Belt Use:  yes Sexual History:  currently monogamous Drug Use:  never Blood Transfusions:  no  Review of Systems  The patient denies  anorexia, fever, weight loss, weight gain, vision loss, decreased hearing, chest pain, syncope, dyspnea on exertion, headaches, hemoptysis, abdominal pain, severe indigestion/heartburn, incontinence, muscle weakness, suspicious skin lesions, difficulty walking, unusual weight change, enlarged lymph nodes, and angioedema.    Physical Exam  General:  Well-developed,well-nourished,in no acute distress; alert,appropriate and cooperative throughout examination Head:  Normocephalic and atraumatic without obvious abnormalities. No apparent alopecia or balding. Eyes:  C&S clear, remainder of exam deferred to opthal Ears:  cerumen impaction right EAC, left EAC and TM normal Nose:  no external deformity and no external erythema.   Mouth:  Oral mucosa and oropharynx without lesions or exudates.  Teeth in good repair. Neck:  supple, full ROM, no thyromegaly, and no carotid bruits.   Chest Wall:  No deformities, masses, or tenderness noted. Breasts:  deferred to normal mammogram Lungs:  Normal respiratory effort, chest expands symmetrically. Lungs are clear to auscultation, no crackles or wheezes. Heart:  Normal rate and regular rhythm. S1 and S2 normal without gallop, murmur, click, rub or other extra sounds. Abdomen:  soft and normal bowel sounds.   Genitalia:  deferred to age and s/p hysterectomy Msk:  normal ROM, no joint tenderness, no joint swelling, no redness over joints, and no joint instability.   Pulses:  2+ radial, PT and DP pulses Extremities:  No clubbing, cyanosis, edema, or deformity noted with normal full range of motion of all joints.   Neurologic:  alert & oriented X3, cranial nerves II-XII intact, gait normal, and DTRs symmetrical and normal.   Skin:  losts of freckles  but no suspicious lesions Cervical Nodes:  no anterior cervical adenopathy and no posterior cervical adenopathy.   Psych:  Oriented X3, memory intact for recent and remote, normally interactive, and good eye contact.      Impression & Recommendations:  Problem # 1:  DEPRESSION, SITUATIONAL (ICD-309.0) resovled  Problem # 2:  HYPERTENSION (ICD-401.9)  Her updated medication list for this problem includes:    Benicar 40 Mg Tabs (Olmesartan medoxomil) .Marland Kitchen... 1 by mouth once daily    Furosemide 40 Mg Tabs (Furosemide) .Marland Kitchen... 1/2 by mouth once daily. for blood pressure    Toprol Xl 25 Mg Xr24h-tab (Metoprolol succinate) .Marland Kitchen... 1 by mouth once daily    Amlodipine Besylate 5 Mg Tabs (Amlodipine besylate) .Marland Kitchen... 1 by mouth once daily  BP today: 138/78 Prior BP: 142/72 (08/20/2010)  Prior 10 Yr Risk Heart Disease: 9 % (06/25/2008)  Adequate control. She will monitor he BP. Will obtain routine labs.  Plan - continue present regimen.  Orders: TLB-BMP (Basic Metabolic Panel-BMET) (80048-METABOL)  Problem #  3:  HYPERLIPIDEMIA (ICD-272.4) Due for lab with recommendations to follow.  Her updated medication list for this problem includes:    Crestor 5 Mg Tabs (Rosuvastatin calcium) ..... One daily  Orders: TLB-Lipid Panel (80061-LIPID) TLB-Hepatic/Liver Function Pnl (80076-HEPATIC)  Addendum - robust HDL and LDL is below goal of 130.  Plan - continue present medication.  Problem # 4:  Preventive Health Care (ICD-V70.0)  Interval history is benign. Physical exam is normal. Lab results are within normal limits. She is current with mammography with last study Dec '11.She is no longer in need of colorectal cancer screening. Immunizations:  Tdap today; Pneumonia vaccine Dec '05; she is a candidate for shingles vaccine - she will check on insurance coverage. 12 lead EKG with possible posterior fasicular block, no evidence of ischemia or injury.  In summary- a nice woman who is medically stable and doing well. She will return as needed or in 1 year.   Orders: Medicare -1st Annual Wellness Visit [phone])  Complete Medication List: 1)  Xalatan 0.005 % Soln (Latanoprost) .... Take at bedtime 2)  Icaps Mv Tabs  (Multiple vitamins-minerals) .... Take 2 tablets twice daily 3)  Calcium 1000mg   .... Take one tablet daily 4)  Fish Oil 1000 Mg Caps (Omega-3 fatty acids) .... Take one tablet daily 5)  Garlic 1000 Mg Caps (Garlic) .... Take one tablet daily 6)  Crestor 5 Mg Tabs (Rosuvastatin calcium) .... One daily 7)  Benicar 40 Mg Tabs (Olmesartan medoxomil) .Marland Kitchen.. 1 by mouth once daily 8)  Furosemide 40 Mg Tabs (Furosemide) .... 1/2 by mouth once daily. for blood pressure 9)  Toprol Xl 25 Mg Xr24h-tab (Metoprolol succinate) .Marland Kitchen.. 1 by mouth once daily 10)  Amlodipine Besylate 5 Mg Tabs (Amlodipine besylate) .Marland Kitchen.. 1 by mouth once daily  Other Orders: Tdap => 19yrs IM (60454) Admin 1st Vaccine (09811)  Patient: Amy Turner Note: All result statuses are Final unless otherwise noted.  Tests: (1) Lipid Panel (LIPID)   Cholesterol          [H]  201 mg/dL                   9-147     ATP III Classification            Desirable:  < 200 mg/dL                    Borderline High:  200 - 239 mg/dL               High:  > = 240 mg/dL   Triglycerides             60.0 mg/dL                  8.2-956.2     Normal:  <150 mg/dL     Borderline High:  130 - 199 mg/dL   HDL                       86.57 mg/dL                 >84.69   VLDL Cholesterol          12.0 mg/dL                  6.2-95.2  CHO/HDL Ratio:  CHD Risk  3                    Men          Women     1/2 Average Risk     3.4          3.3     Average Risk          5.0          4.4     2X Average Risk          9.6          7.1     3X Average Risk          15.0          11.0                           Tests: (2) Hepatic/Liver Function Panel (HEPATIC)   Total Bilirubin           0.8 mg/dL                   8.1-1.9   Direct Bilirubin          0.1 mg/dL                   1.4-7.8   Alkaline Phosphatase      97 U/L                      39-117   AST                       21 U/L                      0-37   ALT                       17  U/L                      0-35   Total Protein             6.1 g/dL                    2.9-5.6   Albumin                   3.6 g/dL                    2.1-3.0  Tests: (3) BMP (METABOL)   Sodium                    143 mEq/L                   135-145   Potassium                 4.6 mEq/L                   3.5-5.1   Chloride                  108 mEq/L                   96-112   Carbon Dioxide            29 mEq/L  19-32   Glucose                   94 mg/dL                    09-81   BUN                       15 mg/dL                    1-91   Creatinine                0.7 mg/dL                   4.7-8.2   Calcium                   9.2 mg/dL                   9.5-62.1   GFR                       79.78 mL/min                >60.00  Tests: (4) Cholesterol LDL - Direct (DIRLDL)  Cholesterol LDL - Direct                             124.7 mg/dL     Optimal:  <308 mg/dL     Near or Above Optimal:  100-129 mg/dL     Borderline High:  657-846 mg/dL     High:  962-952 mg/dL     Very High:  >841 mg/dL  Orders Added: 1)  TLB-Lipid Panel [80061-LIPID] 2)  TLB-Hepatic/Liver Function Pnl [80076-HEPATIC] 3)  TLB-BMP (Basic Metabolic Panel-BMET) [80048-METABOL] 4)  Tdap => 60yrs IM [90715] 5)  Admin 1st Vaccine [90471] 6)  Medicare -1st Annual Wellness Visit [G0438] 7)  Est. Patient Level III [32440]   Immunizations Administered:  Tetanus Vaccine:    Vaccine Type: Tdap    Site: right deltoid    Mfr: GlaxoSmithKline    Dose: 0.5 ml    Route: IM    Given by: Burnard Leigh CMA(AAMA)    Exp. Date: 07/17/2012    Lot #: [id number]    VIS given: 08/15/08 version given November 14, 2010.   Immunizations Administered:  Tetanus Vaccine:    Vaccine Type: Tdap    Site: right deltoid    Mfr: GlaxoSmithKline    Dose: 0.5 ml    Route: IM    Given by: Burnard Leigh CMA(AAMA)    Exp. Date: 07/17/2012    Lot #: [id number]    VIS given: 08/15/08 version given November 14, 2010.

## 2010-11-25 NOTE — Miscellaneous (Signed)
Summary: Orders Update   Clinical Lists Changes  Problems: Added new problem of POSTMENOPAUSAL OSTEOPOROSIS (ICD-733.01) Orders: Added new Test order of T-Bone Densitometry [phone]) - Signed Added new Test order of T-Lumbar Vertebral Assessment [phone]) - Signed

## 2010-12-01 ENCOUNTER — Encounter: Payer: Self-pay | Admitting: Internal Medicine

## 2010-12-05 ENCOUNTER — Ambulatory Visit (INDEPENDENT_AMBULATORY_CARE_PROVIDER_SITE_OTHER): Payer: Medicare Other | Admitting: Internal Medicine

## 2010-12-05 ENCOUNTER — Ambulatory Visit (INDEPENDENT_AMBULATORY_CARE_PROVIDER_SITE_OTHER)
Admission: RE | Admit: 2010-12-05 | Discharge: 2010-12-05 | Disposition: A | Discharge status: Home / Self Care | Payer: Medicare Other | Source: Ambulatory Visit | Attending: Internal Medicine | Admitting: Internal Medicine

## 2010-12-05 ENCOUNTER — Other Ambulatory Visit: Payer: Self-pay | Admitting: Internal Medicine

## 2010-12-05 ENCOUNTER — Encounter: Payer: Self-pay | Admitting: Internal Medicine

## 2010-12-05 DIAGNOSIS — M25539 Pain in unspecified wrist: Secondary | ICD-10-CM

## 2010-12-05 DIAGNOSIS — M810 Age-related osteoporosis without current pathological fracture: Secondary | ICD-10-CM

## 2010-12-07 ENCOUNTER — Telehealth: Payer: Self-pay | Admitting: Internal Medicine

## 2010-12-09 NOTE — Letter (Signed)
   [facility] [address] [place], [place]  [address] Phone: [phone]      December 01, 2010   [facility] [address] [place], [place] [address]  RE:  LAB RESULTS  Dear  Ms. Baksh,  The following is an interpretation of your most recent lab tests.  Please take note of any instructions provided or changes to medications that have resulted from your lab work.   DXA scan reveals normal bone density at the lumbar spine. There is osteoporosis at the hips with a 10 year fracture risk of 11% vs normal risk of 7.8%  Please come to see me so we can discuss treatment opitons    Sincerely Yours,    Jacques Navy MD

## 2010-12-12 ENCOUNTER — Telehealth: Payer: Self-pay | Admitting: Internal Medicine

## 2010-12-16 NOTE — Assessment & Plan Note (Signed)
Summary: FU TO DISCUSS TREATMENT OPTIONS/LB   Vital Signs:  Patient profile:   77 year old female Height:      59.75 inches Weight:      154 pounds BMI:     30.44 O2 Sat:      95 % on Room air Temp:     98.1 degrees F oral Pulse rate:   65 / minute BP sitting:   130 / 80  (left arm) Cuff size:   regular  Vitals Entered By: Bill Salinas CMA (December 05, 2010 10:42 AM)  O2 Flow:  Room air CC: ov to discuss treatment options/ ab   Primary Care Provider:  Jacques Navy MD  CC:  ov to discuss treatment options/ ab.  History of Present Illness: Patient presents to discuss DXA results - which reveal osteoporosis at the hips. She is currently not on any therapy.   She reports that she had a crush injury to her left wrist when her husband rolled over her wrist and has had soreness ever since. She now has a hard lump at the lateral aspect, dorsal location left wrist that is painful.   Current Medications (verified): 1)  Xalatan 0.005 %  Soln (Latanoprost) .... Take At Bedtime 2)  Icaps Mv   Tabs (Multiple Vitamins-Minerals) .... Take 2 Tablets Twice Daily 3)  Calcium 1000mg  .... Take One Tablet Daily 4)  Fish Oil 1000 Mg  Caps (Omega-3 Fatty Acids) .... Take One Tablet Daily 5)  Garlic 1000 Mg  Caps (Garlic) .... Take One Tablet Daily 6)  Crestor 5 Mg Tabs (Rosuvastatin Calcium) .... One Daily 7)  Benicar 40 Mg  Tabs (Olmesartan Medoxomil) .Marland Kitchen.. 1 By Mouth Once Daily 8)  Furosemide 40 Mg  Tabs (Furosemide) .... 1/2 By Mouth Once Daily. For Blood Pressure 9)  Toprol Xl 25 Mg Xr24h-Tab (Metoprolol Succinate) .Marland Kitchen.. 1 By Mouth Once Daily 10)  Amlodipine Besylate 5 Mg Tabs (Amlodipine Besylate) .Marland Kitchen.. 1 By Mouth Once Daily  Allergies (verified): No Known Drug Allergies PMH-FH-SH reviewed-no changes except otherwise noted  Review of Systems  The patient denies anorexia, weight loss, weight gain, chest pain, dyspnea on exertion, abdominal pain, muscle weakness, difficulty walking, and  enlarged lymph nodes.    Physical Exam  General:  Well-developed,well-nourished,in no acute distress; alert,appropriate and cooperative throughout examination Eyes:  pupils equal and pupils round.   Neck:  supple.   Lungs:  normal respiratory effort.   Heart:  normal rate and regular rhythm.   Msk:  small, pea-sized hard lump left wrist c/w calcium deposit. Neurologic:  alert & oriented X3 and cranial nerves II-XII intact.   Skin:  turgor normal and color normal.   Psych:  Oriented X3, memory intact for recent and remote, normally interactive, and good eye contact.     Impression & Recommendations:  Problem # 1:  POSTMENOPAUSAL OSTEOPOROSIS (ICD-733.01) T-score of -2.5 left femoral neck, -2.6 right. Discussed osteoporosis and the concern for fracture risk. discussed various treatment options with attendent risks vs benefits.  Plan - reclast infusion annually  (great than 50% of a 20 minute visit spent on counseling and education)  Problem # 2:  WRIST PAIN, LEFT (ICD-719.43)  Orders: T-Wrist Comp Left Min 3 Views (73110TC) DG WRIST COMPLETE LEFT - [id number] Clinical Data: Pain after hyperflexion injury.  LEFT WRIST - COMPLETE 3+ VIEW  Comparison: None.  Findings: Scattered degenerative changes, including at the first carpal metacarpal articulation.  Likely degenerative irregularity about the triangular fibrocartilage.  Chondrocalcinosis felt less likely. No acute fracture or dislocation.  Scaphoid intact.  IMPRESSION: Scattered degenerative change without acute finding about the left wrist.  Original Report Authenticated By: Consuello Bossier, M.D.  No indication for further work up at this time. IF the "lump" continues to cause pain will refer to a hand surgeon.   Complete Medication List: 1)  Xalatan 0.005 % Soln (Latanoprost) .... Take at bedtime 2)  Icaps Mv Tabs (Multiple vitamins-minerals) .... Take 2 tablets twice daily 3)  Calcium 1000mg   .... Take one  tablet daily 4)  Fish Oil 1000 Mg Caps (Omega-3 fatty acids) .... Take one tablet daily 5)  Garlic 1000 Mg Caps (Garlic) .... Take one tablet daily 6)  Crestor 5 Mg Tabs (Rosuvastatin calcium) .... One daily 7)  Benicar 40 Mg Tabs (Olmesartan medoxomil) .Marland Kitchen.. 1 by mouth once daily 8)  Furosemide 40 Mg Tabs (Furosemide) .... 1/2 by mouth once daily. for blood pressure 9)  Toprol Xl 25 Mg Xr24h-tab (Metoprolol succinate) .Marland Kitchen.. 1 by mouth once daily 10)  Amlodipine Besylate 5 Mg Tabs (Amlodipine besylate) .Marland Kitchen.. 1 by mouth once daily   Orders Added: 1)  T-Wrist Comp Left Min 3 Views [73110TC] 2)  Est. Patient Level III [91478]

## 2010-12-16 NOTE — Progress Notes (Signed)
  Phone Note Outgoing Call   Reason for Call: Discuss lab or test results Summary of Call: please call pt: wrist x-ray normal. For continued trouble with the "lump" can refer to a hand surgeon.  Thanks Initial call taken by: Jacques Navy MD,  December 07, 2010 5:32 PM  Follow-up for Phone Call        Pt states that the "lump" still hurts. She states that she will tough it out and if it starts to be a problem she will let MD know Follow-up by: Ami Bullins CMA,  December 09, 2010 3:54 PM

## 2010-12-25 NOTE — Progress Notes (Signed)
Summary: Reclast?   Phone Note Call from Patient   Summary of Call: Patient is requesting status of reclast, please advise.  Initial call taken by: Lamar Sprinkles, CMA,  December 12, 2010 4:12 PM  Follow-up for Phone Call        pt is sch for reclast December 30, 2010. Pt would like to discuss reclast infusion with MD. she wants reassurance in talking with MD Follow-up by: Ami Bullins CMA,  December 15, 2010 3:45 PM  Additional Follow-up for Phone Call Additional follow up Details #1::        called patient and reassured her. She is willing to proceed. Additional Follow-up by: Jacques Navy MD,  December 16, 2010 7:44 AM

## 2010-12-29 ENCOUNTER — Other Ambulatory Visit (INDEPENDENT_AMBULATORY_CARE_PROVIDER_SITE_OTHER): Payer: Medicare Other

## 2010-12-29 ENCOUNTER — Other Ambulatory Visit: Payer: Self-pay | Admitting: Internal Medicine

## 2010-12-29 DIAGNOSIS — N958 Other specified menopausal and perimenopausal disorders: Secondary | ICD-10-CM

## 2010-12-29 DIAGNOSIS — I10 Essential (primary) hypertension: Secondary | ICD-10-CM

## 2010-12-29 DIAGNOSIS — M818 Other osteoporosis without current pathological fracture: Secondary | ICD-10-CM

## 2010-12-29 DIAGNOSIS — M810 Age-related osteoporosis without current pathological fracture: Secondary | ICD-10-CM

## 2010-12-29 LAB — CREATININE, SERUM: Creatinine, Ser: 0.6 mg/dL (ref 0.4–1.2)

## 2010-12-29 LAB — CALCIUM: Calcium: 8.8 mg/dL (ref 8.4–10.5)

## 2010-12-30 ENCOUNTER — Ambulatory Visit (HOSPITAL_COMMUNITY): Payer: Medicare Other | Attending: Internal Medicine

## 2010-12-30 DIAGNOSIS — M810 Age-related osteoporosis without current pathological fracture: Secondary | ICD-10-CM | POA: Insufficient documentation

## 2011-01-06 ENCOUNTER — Telehealth: Payer: Self-pay | Admitting: *Deleted

## 2011-01-06 DIAGNOSIS — R5381 Other malaise: Secondary | ICD-10-CM

## 2011-01-06 NOTE — Telephone Encounter (Signed)
Per MD ok for labs tomorrow, Pt is aware, please help, what labs does pt need?

## 2011-01-06 NOTE — Telephone Encounter (Signed)
Pt c/o extreme fatigue and weakness. Had reclast last tuesday

## 2011-01-07 ENCOUNTER — Other Ambulatory Visit (INDEPENDENT_AMBULATORY_CARE_PROVIDER_SITE_OTHER): Payer: Medicare Other

## 2011-01-07 ENCOUNTER — Other Ambulatory Visit: Payer: Medicare Other

## 2011-01-07 DIAGNOSIS — R5381 Other malaise: Secondary | ICD-10-CM

## 2011-01-07 DIAGNOSIS — R5383 Other fatigue: Secondary | ICD-10-CM

## 2011-01-07 LAB — CBC WITH DIFFERENTIAL/PLATELET
Basophils Relative: 0.8 % (ref 0.0–3.0)
HCT: 38.5 % (ref 36.0–46.0)
Hemoglobin: 13 g/dL (ref 12.0–15.0)
Lymphocytes Relative: 26.9 % (ref 12.0–46.0)
MCHC: 33.7 g/dL (ref 30.0–36.0)
Monocytes Relative: 9.1 % (ref 3.0–12.0)
Neutro Abs: 5.2 10*3/uL (ref 1.4–7.7)
RBC: 4.05 Mil/uL (ref 3.87–5.11)

## 2011-01-07 LAB — COMPREHENSIVE METABOLIC PANEL
BUN: 12 mg/dL (ref 6–23)
CO2: 31 mEq/L (ref 19–32)
Calcium: 9 mg/dL (ref 8.4–10.5)
Chloride: 105 mEq/L (ref 96–112)
Creatinine, Ser: 0.7 mg/dL (ref 0.4–1.2)
GFR: 82.31 mL/min (ref >60.00)

## 2011-01-07 LAB — MAGNESIUM: Magnesium: 2.2 mg/dL (ref 1.5–2.5)

## 2011-01-07 LAB — SEDIMENTATION RATE: Sed Rate: 33 mm/hr — ABNORMAL HIGH (ref 0–22)

## 2011-01-07 NOTE — Telephone Encounter (Signed)
Order placed for CBC, Metabolic, Mg.

## 2011-01-10 ENCOUNTER — Emergency Department (HOSPITAL_COMMUNITY)
Admission: EM | Admit: 2011-01-10 | Discharge: 2011-01-10 | Disposition: A | Discharge status: Home / Self Care | Payer: Medicare Other | Attending: Emergency Medicine | Admitting: Emergency Medicine

## 2011-01-10 ENCOUNTER — Emergency Department (HOSPITAL_COMMUNITY): Payer: Medicare Other

## 2011-01-10 DIAGNOSIS — S62113A Displaced fracture of triquetrum [cuneiform] bone, unspecified wrist, initial encounter for closed fracture: Secondary | ICD-10-CM | POA: Insufficient documentation

## 2011-01-10 DIAGNOSIS — M810 Age-related osteoporosis without current pathological fracture: Secondary | ICD-10-CM | POA: Insufficient documentation

## 2011-01-10 DIAGNOSIS — M25539 Pain in unspecified wrist: Secondary | ICD-10-CM | POA: Insufficient documentation

## 2011-01-10 DIAGNOSIS — I10 Essential (primary) hypertension: Secondary | ICD-10-CM | POA: Insufficient documentation

## 2011-01-10 DIAGNOSIS — X58XXXA Exposure to other specified factors, initial encounter: Secondary | ICD-10-CM | POA: Insufficient documentation

## 2011-01-10 DIAGNOSIS — Z853 Personal history of malignant neoplasm of breast: Secondary | ICD-10-CM | POA: Insufficient documentation

## 2011-01-10 DIAGNOSIS — E7800 Pure hypercholesterolemia, unspecified: Secondary | ICD-10-CM | POA: Insufficient documentation

## 2011-01-10 LAB — CBC
MCH: 31 pg (ref 26.0–34.0)
Platelets: 274 10*3/uL (ref 150–400)
RBC: 4.26 MIL/uL (ref 3.87–5.11)
WBC: 10.6 10*3/uL — ABNORMAL HIGH (ref 4.0–10.5)

## 2011-01-10 LAB — BASIC METABOLIC PANEL
Chloride: 106 mEq/L (ref 96–112)
Creatinine, Ser: 0.76 mg/dL (ref 0.4–1.2)
GFR calc Af Amer: >60 mL/min (ref >60)
Potassium: 3.7 mEq/L (ref 3.5–5.1)

## 2011-01-10 LAB — DIFFERENTIAL
Basophils Relative: 1 % (ref 0–1)
Eosinophils Absolute: 0.2 10*3/uL (ref 0.0–0.7)
Neutrophils Relative %: 70 % (ref 43–77)

## 2011-01-12 ENCOUNTER — Telehealth: Payer: Self-pay | Admitting: *Deleted

## 2011-01-12 NOTE — Telephone Encounter (Signed)
Pt was seen on 11/2010 - wrist xray was normal. Pt continued to have wrist pain and went to ER Saturday - Dx with fracture (ER report is avail) She was directed to f/u w/Dr Merlyn Lot (?) - ortho, keep wrist splint on and also f/u with PCP. I advised her to call ortho tomorrow to ensure she is seen for f/u and our office if needed. Her pain is minimal - controlled by support and pain med.

## 2011-02-13 NOTE — Assessment & Plan Note (Signed)
[facility]                           PRIMARY CARE OFFICE NOTE   Amy Turner, Amy Turner                       MRN:          [id number]  DATE:01/10/2007                            DOB:          04/05/1926    HISTORY OF PRESENT ILLNESS:  Amy Turner presents today for follow up  of abnormal laboratory from 12/28/2006 where her LDL cholesterol was up  to 175.6, previously having been better controlled.   In reviewing my chart, the patient was seen for a physical  examination/checkup 11/03/2006.  At that time, she complained of a  persistent cough.  She had postherpetic neuralgia that was 90% improved.  She wanted to be checked for iron and ferritin since her sister has been  diagnosed with hemachromatosis.  She was having knee pain.  She had a  scanty wheeze in her right distal lower extremity.  Unfortunately,  dictation was not dome, and the results of the physical exam  and the  plans from that date are lost.   In reconstructing, based upon her complaints, it seems that I may have  recommended her to be on Benicar HCT 20/12.5 because her cough may have  been ACE related.  She never did take the Benicar.  She has continued on  Enalapril/HCT.  Her blood pressure is now inadequately controlled, and  she continues to have a cough, although, less of a cough than before.  The patient was checked for ferritin which was normal at 42.7.  Iron  levels were normal.  There was absolutely no evidence of hemachromatosis  as of lab work of 11/03/2006.   Lipids:  The patient's LVL cholesterol on 11/03/2006 was 137.2 on  Lipitor 10 mg daily.  At some point, I may have recommended and did  recommend increasing her Lipitor to 40 mg daily which she has just now  started taking.  Despite this, her LDL cholesterol was markedly elevated  on 12/28/2006 at 175.6 with normal liver functions.   The patient did return for excision of a lesion 11/10/2006.  She  returned on  11/19/2006, and she healed nicely.  This was diagnosed by  path report as squamous cell carcinoma in situ, but there were clean  marks and the excision was complete.  Sutures were removed, and she has  done well since that time.   CURRENT MEDICATIONS:  1. Xalatan eye drops at bedtime.  2. Eye caps two tablets b.i.d.  3. Calcium 1000 mg daily.  4. Fish oil 1000 mg daily.  5. Vitamin E 400 international units daily.  6. Enalapril HCTZ 10/25 once daily.  7. Garlic 1000 mg daily.  8. Lipitor 40 mg daily.   INTERVAL SOCIAL HISTORY:  The patient's brother has been diagnosed with  metastatic cancer to the liver.  The patient is very concerned that she  may be at risk for liver cancer based on her use of Lipitor.  Her entire  chart was reviewed.  The patient has been on Lipitor since 1999.  She  has had consistently normal liver functions as well as reasonably good  control  of her cholesterol levels.   PHYSICAL EXAMINATION:  VITAL SIGNS:  Temperature 98.3, blood pressure  176/74, pulse 83, weight 162.  No physical examination was conducted at  today's visit.   ASSESSMENT/PLAN:  1. Hypertension.  The patient's blood pressure is poorly controlled.      The patient does continue to have a cough while taking an ACE      inhibitor.  Plan:  The patient is to discontinue Enalapril HCT.      She is to start taking Benicar HCT 20/12.5 mg.  She does have      enough for six days of samples to make sure she tolerates this      well, and I will give her a prescription for a 30 day supply.  She      will need to return for a follow up visit in one month to monitor      her blood pressure.  2. Lipids.  I reassured the patient that there is no connection      between Statin drugs and liver cancer.  I explained the process of      enzyme inhibition in regards to the control of cholesterol at the      hepatocyte level.  I have encouraged her to continue taking      Lipitor.  At this time, we will have  her take 20 mg daily.  She      will return in one month for follow up of cholesterol panel and      liver functions.  3. Hemachromatosis.  The patient's sister has been diagnosed with      hemachromatosis.  The patient's iron levels were checked and were      normal with absolutely no evidence whatsoever of hemachromatosis      with no need for further testing or follow up.   The patient is asked to return to see me in one month as noted.  She can  call sooner for any drug related problems or other problems.     Rosalyn Gess Norins, MD  Electronically Signed    MEN/MedQ  DD: 01/10/2007  DT: 01/11/2007  Job #: [id number]   cc:   Novella Olive

## 2011-02-13 NOTE — Assessment & Plan Note (Signed)
[facility]                           PRIMARY CARE OFFICE NOTE   [name], [name]                       MRN:          [id number]  DATE:11/10/2006                            DOB:          1926-08-26    PRIMARY CARE OFFICE NOTE   Amy Turner presents today for an incisional biopsy of a lesion on her  right distal lower extremity. The patient on examination had been found  to have a 1.2-cm erythematous lesion, raised with scabbing and erosion  at the center of the lesion. This was suspicious for an eroding basal  cell carcinoma.   After informed consent was obtained from the patient verbally in terms  for risks for bleeding and infection, also signed consent. The area was  prepped with alcohol, anesthetized with Xylocaine with epinephrine,  reprepped with Betadine. Using a #15 scalpel, an elliptical incision was  made approximately 2.5-cm to remove the lesion in toto. Specimen was  sent off to pathology. The resulting wound was closed with four  interrupted sutures using 4-0 Ethilon. Good wound closure was obtained.  Area was dressed with neosporin and a bandage. The patient tolerated it  well. She was advised to watch for routine wound precautions including  erythema, drainage, pain, discomfort, fever. She is to return in 8 days  for suture removal.   The patient's labs were reviewed. She did have cholesterol of 219,  triglycerides of 84, HDL 63.8 and LDL of 137.2. The patient was advised  to increase her Lipitor to 40 mg daily and will have repeat laboratory  April 1. Liver functions were normal.   The patient was screened for hemochromatosis, and ferritin level was  normal at 42.7. The patient will notified for repeat lab work available  by phone tree.     Rosalyn Gess Norins, MD  Electronically Signed    MEN/MedQ  DD: 11/11/2006  DT: 11/11/2006  Job #: [id number]

## 2011-04-27 ENCOUNTER — Telehealth: Payer: Self-pay | Admitting: *Deleted

## 2011-04-27 MED ORDER — AMLODIPINE BESYLATE 5 MG PO TABS: 5.0000 mg | ORAL_TABLET | Freq: Every day | ORAL | Status: DC

## 2011-04-27 MED ORDER — ROSUVASTATIN CALCIUM 5 MG PO TABS: 5.0000 mg | ORAL_TABLET | Freq: Every day | ORAL | Status: DC

## 2011-04-27 NOTE — Telephone Encounter (Signed)
Patient requesting RF - unsure med, Pt left vm she would like a call back to discuss.

## 2011-04-27 NOTE — Telephone Encounter (Signed)
Needs RFs - done. Patient informed

## 2011-05-05 ENCOUNTER — Other Ambulatory Visit: Payer: Self-pay | Admitting: Internal Medicine

## 2011-07-03 ENCOUNTER — Other Ambulatory Visit: Payer: Self-pay | Admitting: Internal Medicine

## 2011-07-09 LAB — D-DIMER, QUANTITATIVE: D-Dimer, Quant: 0.31

## 2011-09-30 IMAGING — CR DG WRIST COMPLETE 3+V*L*
4 series · 4 of 4 positions shown · non-contrast
Comparison: None.

CLINICAL DATA: Pain after hyperflexion injury.

LEFT WRIST - COMPLETE 3+ VIEW

[view not recorded (1 of 4)]
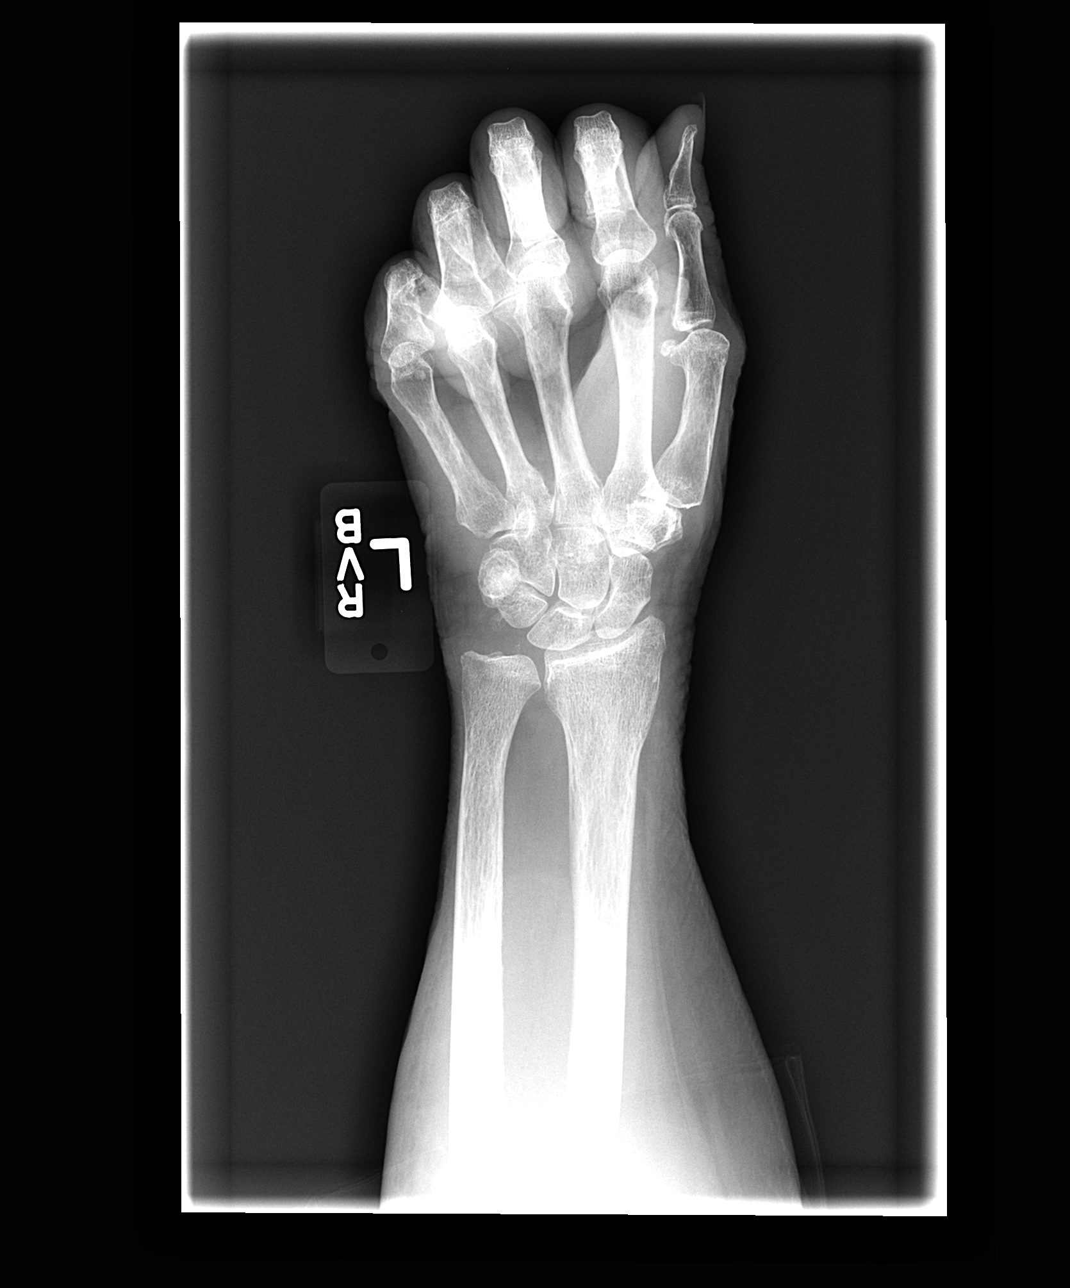

[view not recorded (2 of 4)]
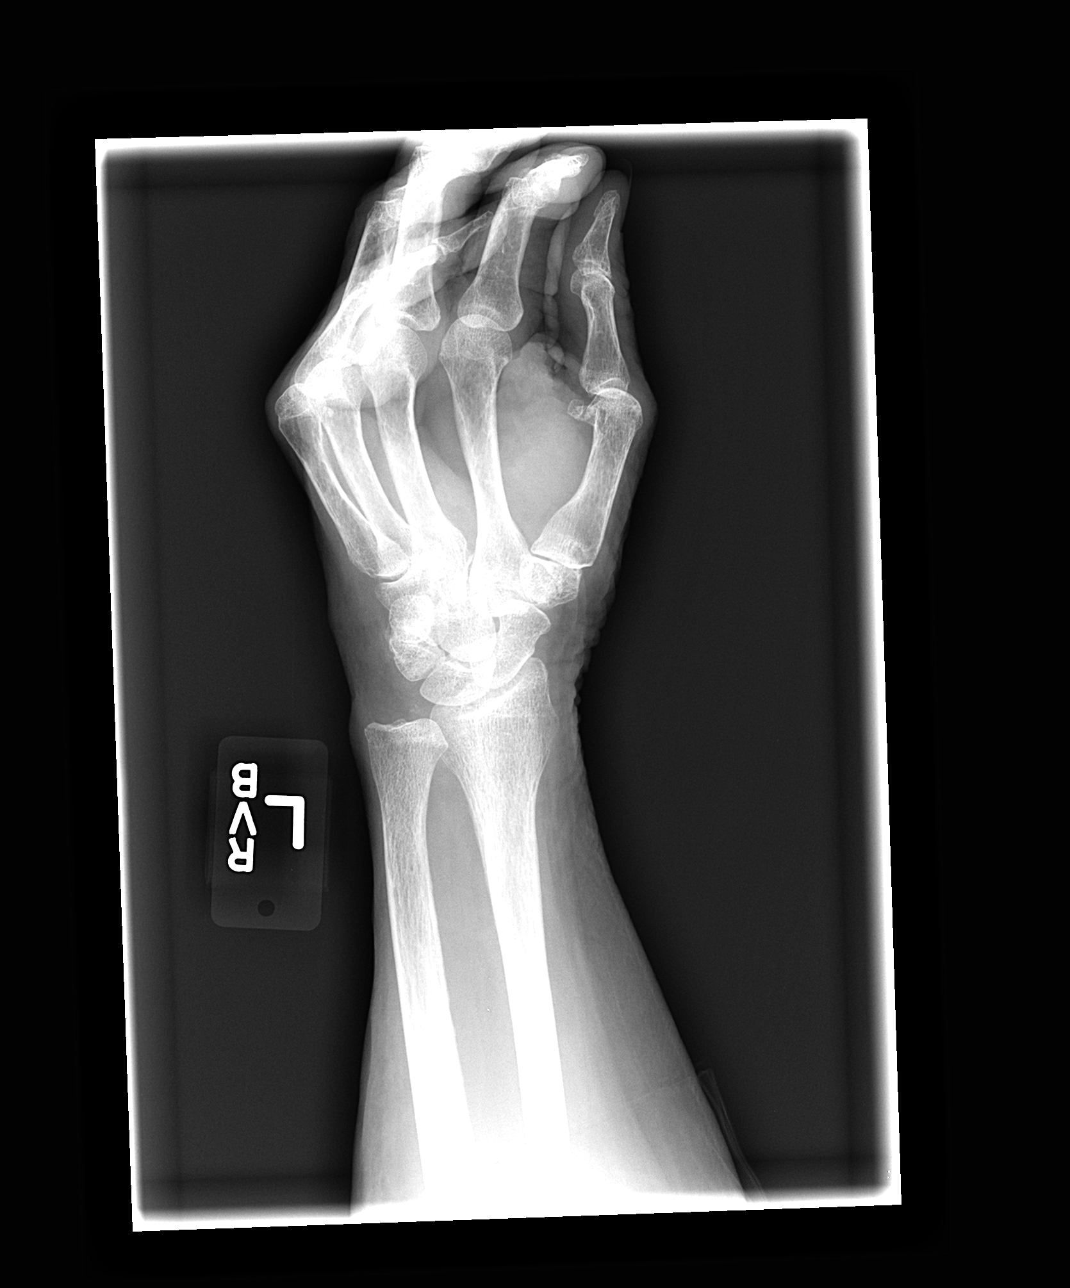

[view not recorded (3 of 4)]
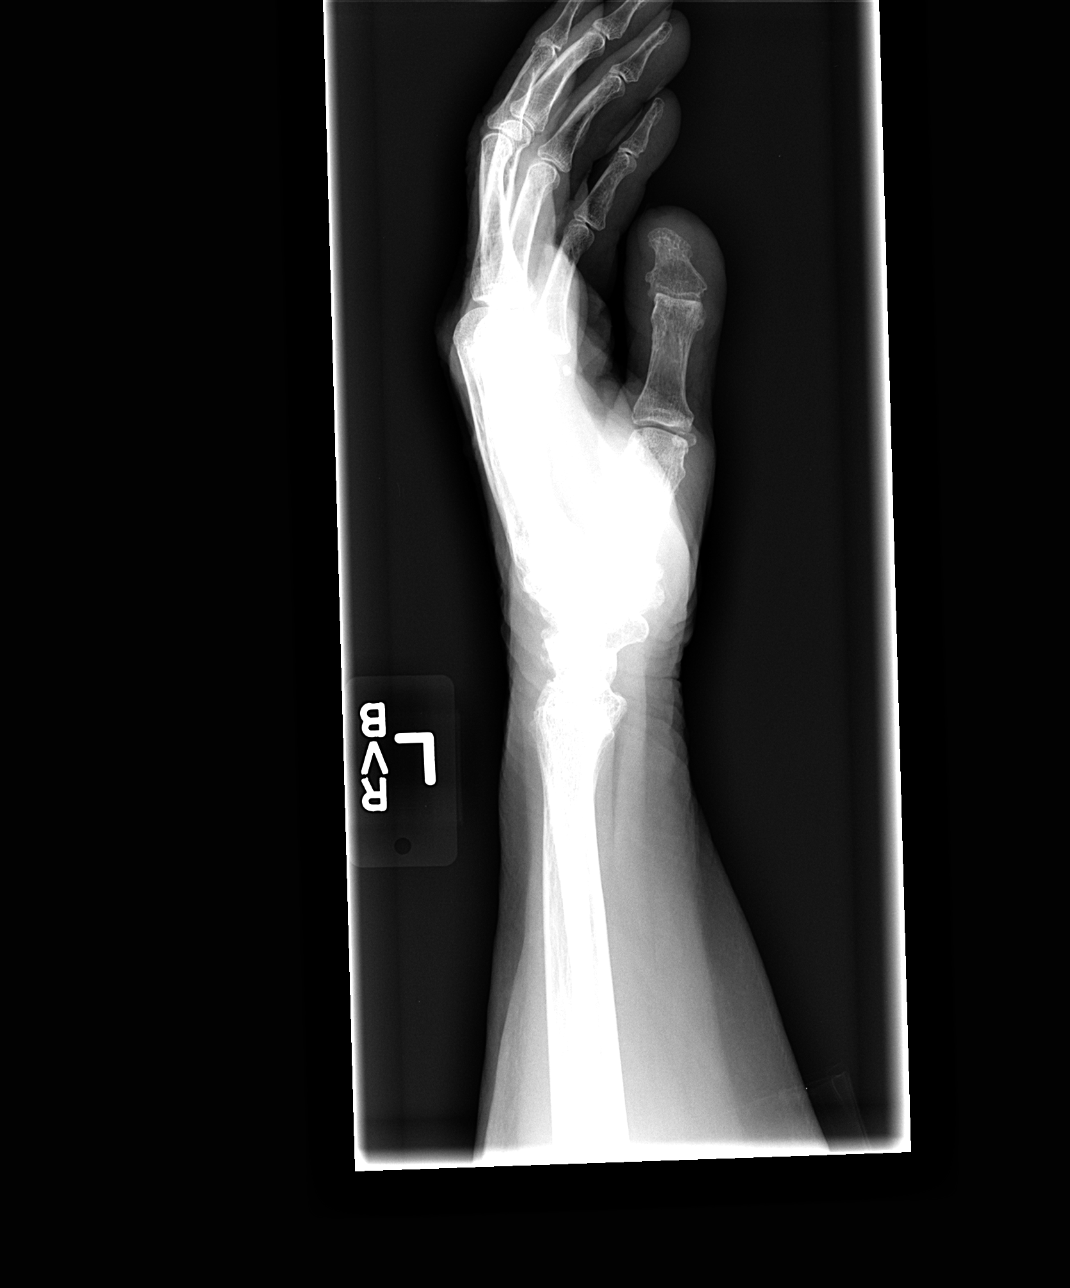

[view not recorded (4 of 4)]
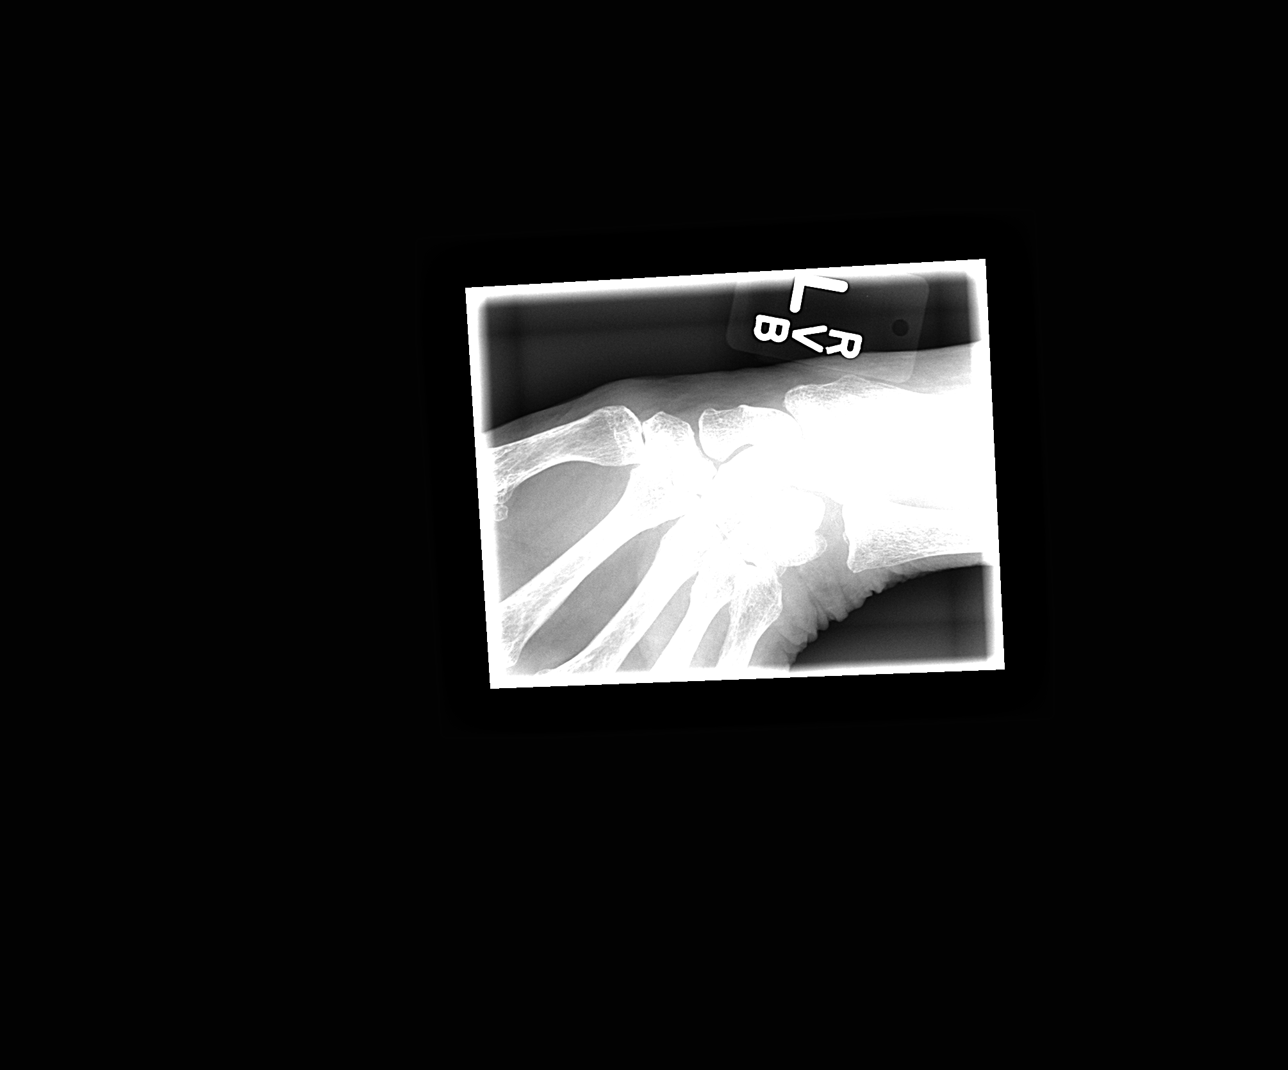

[4 of 4 positions shown; findings below may reference images not displayed]

FINDINGS: Scattered degenerative changes, including at the first
carpal metacarpal articulation.  Likely degenerative irregularity
about the triangular fibrocartilage.  Chondrocalcinosis felt less
likely. No acute fracture or dislocation.  Scaphoid intact.
IMPRESSION: Scattered degenerative change without acute finding about the left
wrist.

## 2011-10-05 ENCOUNTER — Telehealth: Payer: Self-pay | Admitting: *Deleted

## 2011-10-05 NOTE — Telephone Encounter (Signed)
Fax request to know if you & your patient agree to another Tx, if so, I will do a re-verification of benefits and fax paperwork to Reclast for appointment set-up [confirmed Tx 04.30.12 Location: [facility]]. Please advise.

## 2011-10-05 NOTE — Telephone Encounter (Signed)
Please call pt to see if she wants to do another round of reclast.

## 2011-10-07 ENCOUNTER — Encounter: Payer: Self-pay | Admitting: Internal Medicine

## 2011-10-07 NOTE — Telephone Encounter (Signed)
LMOM with contact name & number for call back. 

## 2011-10-19 NOTE — Telephone Encounter (Signed)
Letter mailed to patient regarding Reclast Tx.

## 2011-12-05 ENCOUNTER — Other Ambulatory Visit: Payer: Self-pay | Admitting: Internal Medicine

## 2011-12-11 ENCOUNTER — Other Ambulatory Visit: Payer: Self-pay

## 2011-12-11 MED ORDER — FUROSEMIDE 40 MG PO TABS: 20.0000 mg | ORAL_TABLET | Freq: Every day | ORAL | Status: DC

## 2012-01-29 ENCOUNTER — Encounter: Payer: Self-pay | Admitting: Internal Medicine

## 2012-02-02 ENCOUNTER — Other Ambulatory Visit: Payer: Self-pay | Admitting: Internal Medicine

## 2012-02-25 ENCOUNTER — Encounter: Payer: Self-pay | Admitting: Endocrinology

## 2012-02-25 ENCOUNTER — Ambulatory Visit (INDEPENDENT_AMBULATORY_CARE_PROVIDER_SITE_OTHER): Payer: Medicare Other | Admitting: Endocrinology

## 2012-02-25 ENCOUNTER — Encounter (INDEPENDENT_AMBULATORY_CARE_PROVIDER_SITE_OTHER): Payer: Medicare Other

## 2012-02-25 VITALS — BP 128/62 | HR 67 | Temp 98.1°F | Wt 156.0 lb

## 2012-02-25 DIAGNOSIS — R229 Localized swelling, mass and lump, unspecified: Secondary | ICD-10-CM

## 2012-02-25 DIAGNOSIS — M79609 Pain in unspecified limb: Secondary | ICD-10-CM

## 2012-02-25 DIAGNOSIS — M7120 Synovial cyst of popliteal space [Baker], unspecified knee: Secondary | ICD-10-CM

## 2012-02-25 DIAGNOSIS — M79662 Pain in left lower leg: Secondary | ICD-10-CM

## 2012-02-25 HISTORY — DX: Synovial cyst of popliteal space (Baker), unspecified knee: M71.20

## 2012-02-25 NOTE — Progress Notes (Signed)
Subjective:    Patient ID: Amy Turner, female    DOB: 19-Aug-1926, 77 y.o.   MRN: [id number]  HPI Pt states 1 day of pain at the left upper leg, just distal to the knee.  It is worse with walking.  No local injury.  No assoc numbness. Past Medical History  Diagnosis Date  . CARCINOMA, BREAST, LEFT 06/07/2007    Qualifier: History of  By: Genelle Gather CMA, [place]    . CERUMEN IMPACTION, BILATERAL 04/12/2008    Qualifier: Diagnosis of  By: Roxan Hockey CMA, Shanda Bumps    . DEPRESSION, SITUATIONAL 03/26/2009    Qualifier: Diagnosis of  By: Debby Bud MD, Rosalyn Gess   . GLAUCOMA 06/07/2007    Qualifier: Diagnosis of  By: Genelle Gather CMA, [place]    . HYPERLIPIDEMIA 06/07/2007    Qualifier: Diagnosis of  By: Genelle Gather CMA, [place]    . HYPERTENSION 06/07/2007    Qualifier: Diagnosis of  By: Genelle Gather CMA, [place]    . HYSTERECTOMY, HX OF 06/07/2007    Qualifier: Diagnosis of  By: Genelle Gather CMA, [place]    . Personal history of surgery to other organs 06/07/2007    Centricity Description: BLADDER REPAIR, HX OF Qualifier: Diagnosis of  By: Genelle Gather CMA, [place]   Centricity Description: LUMPECTOMY, BREAST, HX OF Qualifier: Diagnosis of  By: Genelle Gather CMA, [place]    . POSTMENOPAUSAL OSTEOPOROSIS 11/18/2010    Qualifier: Diagnosis of  By: Debby Bud MD, Rosalyn Gess     Past Surgical History  Procedure Date  . Breast surgery 1993    lumpectomy  . Bladder repair   . Abdominal hysterectomy     History   Social History  . Marital Status: Married    Spouse Name: N/A    Number of Children: N/A  . Years of Education: N/A   Occupational History  . Not on file.   Social History Main Topics  . Smoking status: Never Smoker   . Smokeless tobacco: Not on file  . Alcohol Use: No  . Drug Use: Not on file  . Sexually Active: Not on file   Other Topics Concern  . Not on file   Social History Narrative   Married '502 sons-'58, '69 oldest died CAD/MI; 2 grandchildrenLives with husband and sin lives [place],  handcraftsEnd-of-life Issues: Does not want DPR, Mechanical ventilation or heroic measures. She has a living will    Current Outpatient Prescriptions on File Prior to Visit  Medication Sig Dispense Refill  . amLODipine (NORVASC) 5 MG tablet Take 1 tablet (5 mg total) by mouth daily.  90 tablet  3  . BENICAR 40 MG tablet TAKE 1 TABLET DAILY  90 tablet  0  . Calcium Carb-Cholecalciferol (CALCIUM 1000 + D) 1000-800 MG-UNIT TABS Take 1 tablet by mouth daily.      . furosemide (LASIX) 40 MG tablet Take 0.5 tablets (20 mg total) by mouth daily.  90 tablet  1  . metoprolol succinate (TOPROL-XL) 25 MG 24 hr tablet TAKE 1 TABLET DAILY  90 tablet  2  . rosuvastatin (CRESTOR) 5 MG tablet Take 1 tablet (5 mg total) by mouth at bedtime.  90 tablet  3    No Known Allergies  Family History  Problem Relation Age of Onset  . Hyperlipidemia Mother   . Hypertension Mother   . Cervical cancer Mother   . Coronary artery disease Mother   . Depression Father   . Colon cancer Neg Hx   . Diabetes Neg Hx  BP 128/62  Pulse 67  Temp(Src) 98.1 F (36.7 C) (Oral)  Wt 156 lb (70.761 kg)  SpO2 93%    Review of Systems Denies rash and knee pain.     Objective:   Physical Exam VITAL SIGNS:  See vs page GENERAL: no distress Left calf: slightly tender, but no warmth/swelling/erythema Left knee: normal Gait: favors LLE      Assessment & Plan:  Leg pain, uncertain etiology.  new

## 2012-02-25 NOTE — Patient Instructions (Signed)
Let's check a test for a blood clot I hope you feel better soon.  If you don't feel better by next week, please call back.  Please call sooner if you get worse.

## 2012-02-29 ENCOUNTER — Other Ambulatory Visit: Payer: Self-pay

## 2012-02-29 MED ORDER — AMLODIPINE BESYLATE 5 MG PO TABS: 5.0000 mg | ORAL_TABLET | Freq: Every day | ORAL | Status: DC

## 2012-03-07 ENCOUNTER — Ambulatory Visit (INDEPENDENT_AMBULATORY_CARE_PROVIDER_SITE_OTHER): Payer: Medicare Other | Admitting: Internal Medicine

## 2012-03-07 ENCOUNTER — Encounter: Payer: Self-pay | Admitting: Internal Medicine

## 2012-03-07 ENCOUNTER — Other Ambulatory Visit (INDEPENDENT_AMBULATORY_CARE_PROVIDER_SITE_OTHER): Payer: Medicare Other

## 2012-03-07 VITALS — BP 138/60 | HR 64 | Temp 98.0°F | Resp 16 | Wt 155.0 lb

## 2012-03-07 DIAGNOSIS — M7120 Synovial cyst of popliteal space [Baker], unspecified knee: Secondary | ICD-10-CM

## 2012-03-07 DIAGNOSIS — E785 Hyperlipidemia, unspecified: Secondary | ICD-10-CM | POA: Diagnosis not present

## 2012-03-07 DIAGNOSIS — M810 Age-related osteoporosis without current pathological fracture: Secondary | ICD-10-CM | POA: Diagnosis not present

## 2012-03-07 DIAGNOSIS — Z0000 Encounter for general adult medical examination without abnormal findings: Secondary | ICD-10-CM

## 2012-03-07 DIAGNOSIS — I10 Essential (primary) hypertension: Secondary | ICD-10-CM | POA: Diagnosis not present

## 2012-03-07 DIAGNOSIS — C50919 Malignant neoplasm of unspecified site of unspecified female breast: Secondary | ICD-10-CM

## 2012-03-07 LAB — COMPREHENSIVE METABOLIC PANEL
Albumin: 3.7 g/dL (ref 3.5–5.2)
CO2: 28 mEq/L (ref 19–32)
Calcium: 9.2 mg/dL (ref 8.4–10.5)
GFR: 82.08 mL/min (ref >60.00)
Glucose, Bld: 90 mg/dL (ref 70–99)
Potassium: 4.7 mEq/L (ref 3.5–5.1)
Sodium: 144 mEq/L (ref 135–145)
Total Bilirubin: 0.8 mg/dL (ref 0.3–1.2)
Total Protein: 6.6 g/dL (ref 6.0–8.3)

## 2012-03-07 LAB — HEPATIC FUNCTION PANEL: Total Bilirubin: 0.8 mg/dL (ref 0.3–1.2)

## 2012-03-07 LAB — LIPID PANEL: HDL: 65.2 mg/dL (ref >39.00)

## 2012-03-07 MED ORDER — IBANDRONATE SODIUM 150 MG PO TABS: 150.0000 mg | ORAL_TABLET | ORAL | Status: DC

## 2012-03-07 NOTE — Progress Notes (Signed)
Subjective:    Patient ID: Amy Turner, female    DOB: November 09, 1925, 78 y.o.   MRN: [id number]  HPI Mrs. Brassell is here for annual Medicare wellness examination and management of other chronic and acute problems.  Chief concern is osteoporosis and treatment: DXA scan evidence of osteoporosis and was given Reclast approx 1 year ago. She had a flu like reaction to the Reclast and does not want to take this medication again.   For the 8-9 days she has been dealing with a swelling in the left popliteal fossa diagnosed as a Baker's cyst by LE venous doppler May 30th. She otherwise has been doing well.   The risk factors are reflected in the social history.  The roster of all physicians providing medical care to patient - is listed in the Snapshot section of the chart.  Activities of daily living:  The patient is 100% inedpendent in all ADLs: dressing, toileting, feeding as well as independent mobility  Home safety : The patient has smoke detectors in the home. They wear seatbelts. firearms are present in the home, kept in a safe fashion. There is no violence in the home.   There is no risks for hepatitis, STDs or HIV. There is no  history of blood transfusion. They have no travel history to infectious disease endemic areas of the world.  The patient has seen their dentist in the last six month. They have seen their eye doctor in the last year. They deny any hearing difficulty and have not had audiologic testing in the last year.  They do not  have excessive sun exposure. Discussed the need for sun protection: hats, long sleeves and use of sunscreen if there is significant sun exposure.   Diet: the importance of a healthy diet is discussed. They do have a healthy diet.  The patient has a regular exercise program: walking, 60 min duration, 5 per week.  The benefits of regular aerobic exercise were discussed.  Depression screen: there are no signs or vegative symptoms of depression-  irritability, change in appetite, anhedonia, sadness/tearfullness.  Cognitive assessment: the patient manages all their financial and personal affairs and is actively engaged. They could relate day,date,year and events; recalled 3/3 objects at 3 minutes; performed clock-face test normally.  The following portions of the patient's history were reviewed and updated as appropriate: allergies, current medications, past family history, past medical history,  past surgical history, past social history  and problem list. She did not want to discuss her h/o breast cancer  Vision, hearing, body mass index were assessed and reviewed.   During the course of the visit the patient was educated and counseled about appropriate screening and preventive services including : fall prevention , diabetes screening, nutrition counseling, colorectal cancer screening, and recommended immunizations.  Past Medical History  Diagnosis Date  . CARCINOMA, BREAST, LEFT 06/07/2007    Qualifier: History of  By: Genelle Gather CMA, [place]    . CERUMEN IMPACTION, BILATERAL 04/12/2008    Qualifier: Diagnosis of  By: Roxan Hockey CMA, Shanda Bumps    . DEPRESSION, SITUATIONAL 03/26/2009    Qualifier: Diagnosis of  By: Debby Bud MD, Rosalyn Gess   . GLAUCOMA 06/07/2007    Qualifier: Diagnosis of  By: Genelle Gather CMA, [place]    . HYPERLIPIDEMIA 06/07/2007    Qualifier: Diagnosis of  By: Genelle Gather CMA, [place]    . HYPERTENSION 06/07/2007    Qualifier: Diagnosis of  By: Genelle Gather CMA, [place]    . HYSTERECTOMY, HX OF 06/07/2007  Qualifier: Diagnosis of  By: Genelle Gather CMA, [place]    . Personal history of surgery to other organs 06/07/2007    Centricity Description: BLADDER REPAIR, HX OF Qualifier: Diagnosis of  By: Genelle Gather CMA, [place]   Centricity Description: LUMPECTOMY, BREAST, HX OF Qualifier: Diagnosis of  By: Genelle Gather CMA, [place]    . POSTMENOPAUSAL OSTEOPOROSIS 11/18/2010    Qualifier: Diagnosis of  By: Debby Bud MD, Rosalyn Gess    Past Surgical History  Procedure Date    . Breast surgery 1993    lumpectomy  . Bladder repair   . Abdominal hysterectomy    Family History  Problem Relation Age of Onset  . Hyperlipidemia Mother   . Hypertension Mother   . Cervical cancer Mother   . Coronary artery disease Mother   . Depression Father   . Colon cancer Neg Hx   . Diabetes Neg Hx    History   Social History  . Marital Status: Married    Spouse Name: N/A    Number of Children: 2  . Years of Education: 12   Occupational History  . homemaker    Social History Main Topics  . Smoking status: Never Smoker   . Smokeless tobacco: Never Used  . Alcohol Use: No  . Drug Use: No  . Sexually Active: No   Other Topics Concern  . Not on file   Social History Narrative   Married 02-Mar-2049. 2 sons-'58, '93 oldest died CAD/MI; 2 grandchildren. Lives with husband and son lives at home. Little gardening, handcrafts. ACP/End-of-life Issues: Does not want CPR, mechanical ventilation or heroic measures. She has a living will      Review of Systems Constitutional:  Negative for fever, chills, activity change and unexpected weight change.  HEENT:  Negative for hearing loss, ear pain, congestion, neck stiffness and postnasal drip. Negative for sore throat or swallowing problems. Negative for dental complaints.   Eyes: Negative for vision loss or change in visual acuity.  Respiratory: Negative for chest tightness and wheezing. Negative for DOE.   Cardiovascular: Negative for chest pain or palpitations. No decreased exercise tolerance Gastrointestinal: No change in bowel habit. No bloating or gas. No reflux or indigestion Genitourinary: Negative for urgency, frequency, flank pain and difficulty urinating.  Musculoskeletal: Negative for myalgias, back pain, arthralgias and gait problem. Uncomfortable swelling in the left leg Neurological: Negative for dizziness, tremors, weakness and headaches.  Hematological: Negative for adenopathy.  Psychiatric/Behavioral: Negative  for behavioral problems and dysphoric mood.       Objective:   Physical Exam Filed Vitals:   03/07/12 0901  BP: 138/60  Pulse: 64  Temp: 98 F (36.7 C)  Resp: 16   Wt Readings from Last 3 Encounters:  03/07/12 155 lb (70.308 kg)  02-Mar-2012 156 lb (70.761 kg)  12/05/10 154 lb (69.854 kg)   Gen'l: well nourished, well developed white woman in no distress HEENT - [place]/AT, EACs/TMs normal, oropharynx with native dentition in good condition, no buccal or palatal lesions, posterior pharynx clear, mucous membranes moist. C&S clear, PERRLA, fundi - normal Neck - supple, no thyromegaly Nodes- negative submental, cervical, supraclavicular regions Chest - no deformity, no CVAT Lungs - cleat without rales, wheezes. No increased work of breathing Breast - deferred to recent mammogram Cardiovascular - regular rate and rhythm, quiet precordium, no murmurs, rubs or gallops, 2+ radial, DP and PT pulses Abdomen - BS+ x 4, no HSM, no guarding or rebound or tenderness Pelvic - deferred to gyn Rectal - deferred to  gyn Extremities - no clubbing, cyanosis, edema or deformity. Fullness noted in the left popliteal fossa with mild tenderness Neuro - A&O x 3, CN II-XII normal, motor strength normal and equal, DTRs 2+ and symmetrical biceps, radial, and patellar tendons. Cerebellar - no tremor, no rigidity, fluid movement and normal gait. Derm - Head, neck, back, abdomen and extremities without suspicious lesions  Lab Results  Component Value Date   WBC 10.6* 01/10/2011   HGB 13.2 01/10/2011   HCT 40.2 01/10/2011   PLT 274 01/10/2011   GLUCOSE 90 03/07/2012   CHOL 183 03/07/2012   TRIG 71.0 03/07/2012   HDL 65.20 03/07/2012   LDLDIRECT 124.7 11/14/2010   LDLCALC 104* 03/07/2012        ALT 11 03/07/2012   AST 20 03/07/2012        NA 144 03/07/2012   K 4.7 03/07/2012   CL 108 03/07/2012   CREATININE 0.7 03/07/2012   BUN 16 03/07/2012   CO2 28 03/07/2012   TSH 2.99 04/12/2008           Assessment & Plan:

## 2012-03-08 ENCOUNTER — Other Ambulatory Visit: Payer: Self-pay

## 2012-03-08 ENCOUNTER — Encounter: Payer: Self-pay | Admitting: Internal Medicine

## 2012-03-08 DIAGNOSIS — Z0000 Encounter for general adult medical examination without abnormal findings: Secondary | ICD-10-CM | POA: Insufficient documentation

## 2012-03-08 MED ORDER — METOPROLOL SUCCINATE ER 25 MG PO TB24: 25.0000 mg | ORAL_TABLET | Freq: Every day | ORAL | Status: DC

## 2012-03-08 NOTE — Assessment & Plan Note (Signed)
BP Readings from Last 3 Encounters:  03/07/12 138/60  02/25/12 128/62  12/05/10 130/80   Adequate control on present medication

## 2012-03-08 NOTE — Assessment & Plan Note (Signed)
Diagnosed with Baker's cyst by ultrasound May 30th. Discomfort is improving  Plan - OTC NSAIDs as needed.

## 2012-03-08 NOTE — Assessment & Plan Note (Signed)
Results of DXA scan cannot be located in [facility] but by history she was positive for osteoporosis. She did have a single infusion of Reclast with post-infusion flu like illness. She will not take this medication again.  Plan Calcium 1200 mg daily (diet plus supplement), Vitamin D 800-1,000 iu daily  Boniva 150 mg once a month  F/u DXA Feb '14

## 2012-03-08 NOTE — Progress Notes (Signed)
Requested chart from MR

## 2012-03-08 NOTE — Assessment & Plan Note (Signed)
Good control with LDL very close to goal of 161 or less.  Plan -  Continue present medication

## 2012-03-08 NOTE — Assessment & Plan Note (Signed)
Has a history of normal mammograms since 1993. She is current for this year

## 2012-03-08 NOTE — Assessment & Plan Note (Signed)
Interval history notable for Baker's cyst left. Physical exam, sans breast and pelvic (s/p hysterectomy and age), is normal. Lab results are in normal limits. She is current with immunizations. Will research last colonoscopy but she at 16 is not a candidate for repeat studies. She is current with mammography.  In summary - a very nice woman who is medically stable. She will continue her walking program. She will start Boniva for osteoporosis. She will return in 1 year or sooner as needed.

## 2012-03-22 ENCOUNTER — Other Ambulatory Visit: Payer: Self-pay

## 2012-03-22 MED ORDER — IBANDRONATE SODIUM 150 MG PO TABS: 150.0000 mg | ORAL_TABLET | ORAL | Status: DC

## 2012-03-30 ENCOUNTER — Encounter: Payer: Self-pay | Admitting: Internal Medicine

## 2012-04-01 ENCOUNTER — Telehealth: Payer: Self-pay | Admitting: Internal Medicine

## 2012-04-01 NOTE — Telephone Encounter (Signed)
Caller: [place]/Patient; PCP: Illene Regulus; CB#: [phone]; ; ; Call regarding Baker s Cyst; June 10th dx w/ Baker's Cyst left leg.  Pain persists, taking Aleve.  Currently rates pain at "Enough to make me hobble when I walk."  Emergent sx ruled out.  Home care for the interim and parameters for callback given.  Advised see provider in 24 hours due to continued and worsening pain.  Caller states preference to wait until 04/04/12 to follow up with provider.  Information noted and sent to office as patient declined appointment.  Parameters for callback and home care for the interim.  Leg Non-Injury protocol used.

## 2012-05-09 ENCOUNTER — Other Ambulatory Visit: Payer: Self-pay | Admitting: Internal Medicine

## 2012-05-19 ENCOUNTER — Telehealth: Payer: Self-pay | Admitting: Internal Medicine

## 2012-05-19 NOTE — Telephone Encounter (Signed)
Caller: Alfred/Patient; Patient Name: Amy Turner; PCP: Illene Regulus; Best Callback Phone Number: [phone].  Patient is going to have oral surgery. She is having an implant placed .  She is suppose to take her blood pressure the morning of her oral surgery. She would like to verify which medication is for her blood pressure?  Epic information reviewed along with medications.  Norvasc, Metoprolol and Benicar reviewed.  Patient expressed understanding. Advised to call back for questions, concerns or changes.

## 2012-06-03 DIAGNOSIS — H409 Unspecified glaucoma: Secondary | ICD-10-CM | POA: Diagnosis not present

## 2012-06-03 DIAGNOSIS — Z961 Presence of intraocular lens: Secondary | ICD-10-CM | POA: Diagnosis not present

## 2012-06-03 DIAGNOSIS — H4011X0 Primary open-angle glaucoma, stage unspecified: Secondary | ICD-10-CM | POA: Diagnosis not present

## 2012-06-20 ENCOUNTER — Other Ambulatory Visit: Payer: Self-pay

## 2012-06-20 MED ORDER — IBANDRONATE SODIUM 150 MG PO TABS: 150.0000 mg | ORAL_TABLET | ORAL | Status: DC

## 2012-06-21 ENCOUNTER — Other Ambulatory Visit: Payer: Self-pay | Admitting: Internal Medicine

## 2012-07-18 DIAGNOSIS — L821 Other seborrheic keratosis: Secondary | ICD-10-CM | POA: Diagnosis not present

## 2012-07-18 DIAGNOSIS — L570 Actinic keratosis: Secondary | ICD-10-CM | POA: Diagnosis not present

## 2012-11-03 DIAGNOSIS — Z1231 Encounter for screening mammogram for malignant neoplasm of breast: Secondary | ICD-10-CM | POA: Diagnosis not present

## 2012-11-15 ENCOUNTER — Encounter: Payer: Self-pay | Admitting: Internal Medicine

## 2012-12-01 DIAGNOSIS — H4011X0 Primary open-angle glaucoma, stage unspecified: Secondary | ICD-10-CM | POA: Diagnosis not present

## 2012-12-01 DIAGNOSIS — H409 Unspecified glaucoma: Secondary | ICD-10-CM | POA: Diagnosis not present

## 2012-12-01 DIAGNOSIS — H353190 Nonexudative age-related macular degeneration, unspecified eye, stage unspecified: Secondary | ICD-10-CM | POA: Diagnosis not present

## 2012-12-06 ENCOUNTER — Telehealth: Payer: Self-pay | Admitting: *Deleted

## 2012-12-06 NOTE — Telephone Encounter (Signed)
Left msg on triage requesting call back concerning medications refills. Called pt back no answer x's 10 rings...Amy Turner

## 2012-12-07 MED ORDER — IBANDRONATE SODIUM 150 MG PO TABS: 150.0000 mg | ORAL_TABLET | ORAL | Status: DC

## 2012-12-07 NOTE — Telephone Encounter (Signed)
Called pt back she stated she need new rx for her boniva. She is out and needing [facility]. Inform pt will send to mail service as well to [facility] so she can take for this month...Raechel Chute

## 2012-12-27 ENCOUNTER — Telehealth: Payer: Self-pay | Admitting: *Deleted

## 2012-12-27 NOTE — Telephone Encounter (Signed)
Pt called regarding Boniva rx. Requested callback, left message for pt to callback office.

## 2012-12-28 MED ORDER — IBANDRONATE SODIUM 150 MG PO TABS: 150.0000 mg | ORAL_TABLET | ORAL | Status: DC

## 2012-12-28 NOTE — Telephone Encounter (Signed)
Pt has not received rx for Boniva that was sent to [facility] in April. Will call [facility] to see what delay is in processing prescription. Will also fax 1 pill to local [facility] so that pt can take for April. Rx for Boniva called into [facility] directly.

## 2012-12-30 ENCOUNTER — Telehealth: Payer: Self-pay

## 2012-12-30 NOTE — Telephone Encounter (Signed)
Fax received from [facility] in [place], [place] stating pt's prescription for Ibandronate Sodium(Boniva) 150 mg needs prior authorization. I called [phone] and spoke to a representative who states this medication is covered under her insurance plan. It is just being requested to be filled to early. It is available for refill tomorrow 12/31/12.

## 2013-01-10 DIAGNOSIS — M722 Plantar fascial fibromatosis: Secondary | ICD-10-CM | POA: Diagnosis not present

## 2013-01-17 DIAGNOSIS — M722 Plantar fascial fibromatosis: Secondary | ICD-10-CM | POA: Diagnosis not present

## 2013-01-27 ENCOUNTER — Other Ambulatory Visit: Payer: Self-pay | Admitting: Internal Medicine

## 2013-02-28 ENCOUNTER — Other Ambulatory Visit: Payer: Self-pay | Admitting: Internal Medicine

## 2013-03-28 ENCOUNTER — Other Ambulatory Visit: Payer: Self-pay | Admitting: Internal Medicine

## 2013-04-25 ENCOUNTER — Other Ambulatory Visit: Payer: Self-pay | Admitting: Internal Medicine

## 2013-05-01 ENCOUNTER — Other Ambulatory Visit: Payer: Self-pay | Admitting: Internal Medicine

## 2013-05-17 ENCOUNTER — Telehealth: Payer: Self-pay | Admitting: *Deleted

## 2013-05-17 NOTE — Telephone Encounter (Signed)
Right - furosemide is not on med list.  Please call patient: 1) has she been taking furosemide all along 2) if not why does she need it now.  Thanks

## 2013-05-17 NOTE — Telephone Encounter (Signed)
Pt called requesting refill on Furosemide 20mg .  Rx is not on med list please advsie.

## 2013-05-17 NOTE — Telephone Encounter (Signed)
Spoke with pt pt states she has been taking Furosemide all along.

## 2013-05-17 NOTE — Telephone Encounter (Signed)
Ok for refills x 11. Add to medication list

## 2013-05-18 MED ORDER — FUROSEMIDE 20 MG PO TABS: 20.0000 mg | ORAL_TABLET | Freq: Every day | ORAL | Status: DC

## 2013-05-18 NOTE — Telephone Encounter (Signed)
Spoke with pt advised Rx complete 

## 2013-05-25 ENCOUNTER — Other Ambulatory Visit: Payer: Self-pay | Admitting: Internal Medicine

## 2013-05-30 ENCOUNTER — Telehealth: Payer: Self-pay | Admitting: *Deleted

## 2013-05-30 MED ORDER — METOPROLOL SUCCINATE ER 25 MG PO TB24: 25.0000 mg | ORAL_TABLET | Freq: Every day | ORAL | Status: DC

## 2013-05-30 NOTE — Telephone Encounter (Signed)
Ok to refill x 3 months??       

## 2013-05-30 NOTE — Telephone Encounter (Signed)
Pt called requesting Metoprolol 25mg  refill, pt has CPE appt scheduled 11.17.14 but has about one weeks worth of medication left.  Last OV 6.2013.  Please advise.

## 2013-06-07 DIAGNOSIS — H4011X0 Primary open-angle glaucoma, stage unspecified: Secondary | ICD-10-CM | POA: Diagnosis not present

## 2013-06-07 DIAGNOSIS — H409 Unspecified glaucoma: Secondary | ICD-10-CM | POA: Diagnosis not present

## 2013-06-21 ENCOUNTER — Other Ambulatory Visit: Payer: Self-pay | Admitting: Internal Medicine

## 2013-06-26 ENCOUNTER — Other Ambulatory Visit: Payer: Self-pay | Admitting: Internal Medicine

## 2013-07-07 ENCOUNTER — Other Ambulatory Visit: Payer: Self-pay | Admitting: *Deleted

## 2013-07-07 MED ORDER — OLMESARTAN MEDOXOMIL 40 MG PO TABS: 40.0000 mg | ORAL_TABLET | Freq: Every day | ORAL | Status: DC

## 2013-07-26 ENCOUNTER — Other Ambulatory Visit: Payer: Self-pay | Admitting: Internal Medicine

## 2013-08-14 ENCOUNTER — Encounter: Payer: Self-pay | Admitting: Internal Medicine

## 2013-08-14 ENCOUNTER — Ambulatory Visit (INDEPENDENT_AMBULATORY_CARE_PROVIDER_SITE_OTHER): Payer: Medicare Other | Admitting: Internal Medicine

## 2013-08-14 VITALS — BP 150/68 | HR 57 | Temp 97.8°F | Wt 157.6 lb

## 2013-08-14 DIAGNOSIS — M810 Age-related osteoporosis without current pathological fracture: Secondary | ICD-10-CM

## 2013-08-14 DIAGNOSIS — E785 Hyperlipidemia, unspecified: Secondary | ICD-10-CM

## 2013-08-14 DIAGNOSIS — I10 Essential (primary) hypertension: Secondary | ICD-10-CM

## 2013-08-14 DIAGNOSIS — Z23 Encounter for immunization: Secondary | ICD-10-CM | POA: Diagnosis not present

## 2013-08-14 DIAGNOSIS — C50919 Malignant neoplasm of unspecified site of unspecified female breast: Secondary | ICD-10-CM

## 2013-08-14 DIAGNOSIS — Z0000 Encounter for general adult medical examination without abnormal findings: Secondary | ICD-10-CM

## 2013-08-14 MED ORDER — ICAPS MV PO TABS: 1.0000 | ORAL_TABLET | Freq: Every day | ORAL | Status: DC

## 2013-08-14 NOTE — Progress Notes (Signed)
Subjective:    Patient ID: Amy Turner, female    DOB: 25-Mar-1926, 79 y.o.   MRN: [id number]  HPI The patient is here for annual Medicare wellness examination and management of other chronic and acute problems.  Intrerval history: negative for major medical illness, surgery or injury.   The risk factors are reflected in the social history.  The roster of all physicians providing medical care to patient - is listed in the Snapshot section of the chart.  Activities of daily living:  The patient is 100% inedpendent in all ADLs: dressing, toileting, feeding as well as independent mobility  Home safety : The patient has smoke detectors in the home. Falls - did have a fall - slid down while leaning against the car. They wear seatbelts.  firearms are present in the home, kept in a safe fashion. There is no violence in the home.   There is no risks for hepatitis, STDs or HIV. There is no history of blood transfusion. They have no travel history to infectious disease endemic areas of the world.  The patient has seen their dentist in the last six month. They have seen their eye doctor in the last year. They deny any hearing difficulty and have not had audiologic testing in the last year.    They do not  have excessive sun exposure. Discussed the need for sun protection: hats, long sleeves and use of sunscreen if there is significant sun exposure.   Diet: the importance of a healthy diet is discussed. They do have a healthy diet.  The patient has no regular exercise program.  The benefits of regular aerobic exercise were discussed.  Depression screen: there are no signs or vegative symptoms of depression- irritability, change in appetite, anhedonia, sadness/tearfullness.  Cognitive assessment: the patient manages all their financial and personal affairs and is actively engaged.   The following portions of the patient's history were reviewed and updated as appropriate: allergies, current  medications, past family history, past medical history,  past surgical history, past social history  and problem list.  Vision, hearing, body mass index were assessed and reviewed.   During the course of the visit the patient was educated and counseled about appropriate screening and preventive services including : fall prevention , diabetes screening, nutrition counseling, colorectal cancer screening, and recommended immunizations.  Past Medical History  Diagnosis Date  . CERUMEN IMPACTION, BILATERAL 04/12/2008    Qualifier: Diagnosis of  By: Roxan Hockey CMA, Shanda Bumps    . DEPRESSION, SITUATIONAL 03/26/2009    Qualifier: Diagnosis of  By: Debby Bud MD, Rosalyn Gess   . GLAUCOMA 06/07/2007    Qualifier: Diagnosis of  By: Genelle Gather CMA, [place]    . HYPERLIPIDEMIA 06/07/2007    Qualifier: Diagnosis of  By: Genelle Gather CMA, [place]    . HYPERTENSION 06/07/2007    Qualifier: Diagnosis of  By: Genelle Gather CMA, [place]    . HYSTERECTOMY, HX OF 06/07/2007    Qualifier: Diagnosis of  By: Genelle Gather CMA, [place]    . Personal history of surgery to other organs 06/07/2007    Centricity Description: BLADDER REPAIR, HX OF Qualifier: Diagnosis of  By: Genelle Gather CMA, [place]   Centricity Description: LUMPECTOMY, BREAST, HX OF Qualifier: Diagnosis of  By: Genelle Gather CMA, [place]    . POSTMENOPAUSAL OSTEOPOROSIS 11/18/2010    Qualifier: Diagnosis of  By: Debby Bud MD, Rosalyn Gess CARCINOMA, BREAST, LEFT 1993    Qualifier: History of  By: Genelle Gather CMA, [place]     Past Surgical  History  Procedure Laterality Date  . Breast surgery  1993    lumpectomy  . Bladder repair    . Abdominal hysterectomy    . Eye surgery  16-Feb-2010    cataract surgery [facility]    Family History  Problem Relation Age of Onset  . Hyperlipidemia Mother   . Hypertension Mother   . Cervical cancer Mother   . Coronary artery disease Mother   . Depression Father   . Colon cancer Neg Hx   . Diabetes Neg Hx    History   Social History  . Marital Status: Married    Spouse  Name: N/A    Number of Children: 2  . Years of Education: 12   Occupational History  . homemaker    Social History Main Topics  . Smoking status: Never Smoker   . Smokeless tobacco: Never Used  . Alcohol Use: No  . Drug Use: No  . Sexual Activity: No   Other Topics Concern  . Not on file   Social History Narrative   Married Feb 16, 2049. 2 sons-'58, '66 oldest died CAD/MI; 2 grandchildren. Lives with husband and son lives at home. Little gardening, handcrafts. ACP/End-of-life Issues: Does not want CPR, mechanical ventilation or heroic measures. She has a living will                Current Outpatient Prescriptions on File Prior to Visit  Medication Sig Dispense Refill  . amLODipine (NORVASC) 5 MG tablet TAKE 1 TABLET DAILY  90 tablet  2  . Calcium Carb-Cholecalciferol (CALCIUM 1000 + D) 1000-800 MG-UNIT TABS Take 1 tablet by mouth daily.      . CRESTOR 5 MG tablet TAKE 1 TABLET AT BEDTIME  90 tablet  0  . fish oil-omega-3 fatty acids 1000 MG capsule Take 1 g by mouth daily.      . furosemide (LASIX) 20 MG tablet Take 1 tablet (20 mg total) by mouth daily.  30 tablet  11  . Garlic 1000 MG CAPS Take 1 capsule by mouth daily.      Marland Kitchen latanoprost (XALATAN) 0.005 % ophthalmic solution Place 1 drop into both eyes at bedtime.      . metoprolol succinate (TOPROL-XL) 25 MG 24 hr tablet Take 1 tablet (25 mg total) by mouth daily.  90 tablet  3  . olmesartan (BENICAR) 40 MG tablet Take 1 tablet (40 mg total) by mouth daily.  90 tablet  0  . furosemide (LASIX) 40 MG tablet Take 0.5 tablets (20 mg total) by mouth daily.  90 tablet  1   No current facility-administered medications on file prior to visit.        Review of Systems Constitutional:  Negative for fever, chills, activity change and unexpected weight change.  HEENT:  Negative for hearing loss, ear pain, congestion, neck stiffness and postnasal drip. Negative for sore throat or swallowing problems. Negative for dental complaints. Had  an implant in 2012-02-17. Eyes: Negative for vision loss or change in visual acuity.  Respiratory: Negative for chest tightness and wheezing. Negative for DOE.   Cardiovascular: Negative for chest pain or palpitations. No decreased exercise tolerance Gastrointestinal: No change in bowel habit. No bloating or gas. No reflux or indigestion Genitourinary: Negative for urgency, frequency, flank pain and difficulty urinating. She does report a visible cystocele but denies any symptoms.  Musculoskeletal: Negative for myalgias, back pain, arthralgias and gait problem.  Neurological: Negative for dizziness, tremors, weakness and headaches.  Hematological: Negative for adenopathy.  Psychiatric/Behavioral: Negative for behavioral problems and dysphoric mood. .       Objective:   Physical Exam Filed Vitals:   08/14/13 1327  BP: 150/68  Pulse: 57  Temp: 97.8 F (36.6 C)   Wt Readings from Last 3 Encounters:  08/14/13 157 lb 9.6 oz (71.487 kg)  03/07/12 155 lb (70.308 kg)  02/25/12 156 lb (70.761 kg)   Gen'l: well nourished, well developed Woman in no distress looking younger than her age HEENT - [place]/AT, EACs/TMs normal, oropharynx with native dentition in good condition, no buccal or palatal lesions, posterior pharynx clear, mucous membranes moist. C&S clear, PERRLA, fundi - normal Neck - supple, no thyromegaly Nodes- negative submental, cervical, supraclavicular regions Chest - no deformity, no CVAT Lungs - clear without rales, wheezes. No increased work of breathing Breast - deferred to mammography Cardiovascular - regular rate and rhythm, quiet precordium, no murmurs, rubs or gallops, 2+ radial, DP and PT pulses Abdomen - BS+ x 4, no HSM, no guarding or rebound or tenderness Pelvic - deferred to age Rectal - deferred to age Extremities - no clubbing, cyanosis, edema or deformity.  Neuro - A&O x 3, CN II-XII normal, motor strength normal and equal, DTRs 2+ and symmetrical biceps, radial, and  patellar tendons. Cerebellar - no tremor, no rigidity, fluid movement and normal gait. Derm - Head, neck, back, abdomen and extremities without suspicious lesions         Assessment & Plan:

## 2013-08-14 NOTE — Progress Notes (Signed)
Pre visit review using our clinic review tool, if applicable. No additional management support is needed unless otherwise documented below in the visit note. 

## 2013-08-14 NOTE — Patient Instructions (Addendum)
Thanks for coming in. You seem to be doing well.  We will check lab today - the results will be sent to you in a letter in 7-10 days.  You are current with all your immunizations but flu vaccine was recommended and Prevnar, a companion vaccine to the pneumovax you had in '05, may be given in 2+ weeks.  You take metoprolol for blood pressure, a beta blocker drug. You have been started on timolol eye drops, a beta blocker medication. Your heart rate today is 57 - just a little low. With taking two beta blockers we need to be vigilant about your heart rate. If you are feeling bad or too sluggish please check your heart rate: count you pulse for 15 sec and multiply by 4 to get a rate per minute. Call if it is less than 50.   Overall you seem to be in good health. Please come back for a nurse visit for Prevnar in 3 weeks.

## 2013-08-15 NOTE — Assessment & Plan Note (Signed)
BP Readings from Last 3 Encounters:  08/14/13 150/68  03/07/12 138/60  02/25/12 128/62   Adequate control. She has been started on timolol eye drops and her heart rate today was 57.  Plan Close monitoring of heart rate with risk of continued or progressive bradycardia  She will discuss use of timolol with Dr. Eulah Pont.

## 2013-08-15 NOTE — Assessment & Plan Note (Signed)
At 82 and having been on bisphosphonate for several years, with being physically active will d/c Boniva. She is to continue calcium and vitamin D supplement.

## 2013-08-15 NOTE — Assessment & Plan Note (Signed)
Taking and tolerating statin treatment.   Plan Lab: lipid and LFTs - recommendations to follow.

## 2013-08-15 NOTE — Assessment & Plan Note (Signed)
Interval history is benign. She is doing well. Limited physical exam is normal. Lab is pending. Reviewed ACP statement in the chart and confirmed with her. She is agreeable to having flu shot and then to return for Prevnar.  In summary A very pleasant woman who appears to be medically stable.

## 2013-08-15 NOTE — Assessment & Plan Note (Signed)
Stable and released by oncology.  Plan Follow up mammography

## 2013-09-05 DIAGNOSIS — H409 Unspecified glaucoma: Secondary | ICD-10-CM | POA: Diagnosis not present

## 2013-09-05 DIAGNOSIS — H4011X0 Primary open-angle glaucoma, stage unspecified: Secondary | ICD-10-CM | POA: Diagnosis not present

## 2013-09-18 ENCOUNTER — Other Ambulatory Visit: Payer: Self-pay | Admitting: Internal Medicine

## 2013-10-02 ENCOUNTER — Telehealth: Payer: Self-pay | Admitting: Internal Medicine

## 2013-10-02 MED ORDER — ROSUVASTATIN CALCIUM 5 MG PO TABS: 5.0000 mg | ORAL_TABLET | Freq: Every day | ORAL | Status: DC

## 2013-10-02 NOTE — Telephone Encounter (Signed)
Phone call back to patient. She states she has change in pharmacy. This change was made and she requested Crestor be sent to [facility]

## 2013-10-02 NOTE — Telephone Encounter (Signed)
Pt request phone call from Dr. Linda Hedges assistant about medication. Pt is very confuse. Please call pt

## 2013-10-06 ENCOUNTER — Telehealth: Payer: Self-pay | Admitting: *Deleted

## 2013-10-06 NOTE — Telephone Encounter (Signed)
Patient had phoned again during lunch today requesting script refill.  Returned patient's phone call, no answer, left message notifying her that medication had already been sent to pharmacy.

## 2013-10-11 ENCOUNTER — Telehealth: Payer: Self-pay | Admitting: *Deleted

## 2013-10-11 MED ORDER — METOPROLOL SUCCINATE ER 25 MG PO TB24: 25.0000 mg | ORAL_TABLET | Freq: Every day | ORAL | Status: DC

## 2013-10-11 NOTE — Telephone Encounter (Signed)
Patient phoned requesting metoprolol refill sent to [facility].  Last OV with PCP 08/14/13.  Refilled per refill protocol.

## 2013-10-17 ENCOUNTER — Other Ambulatory Visit: Payer: Self-pay | Admitting: Internal Medicine

## 2013-10-18 ENCOUNTER — Telehealth: Payer: Self-pay

## 2013-10-18 NOTE — Telephone Encounter (Signed)
Received a fax from [facility] in [place] requesting a drug change from Benicar 40 mg to something else since Benicar is to expensive. Please advise. [facility] states something generic like Losartan, Irbesartan, Telmisartan etc

## 2013-10-19 ENCOUNTER — Other Ambulatory Visit: Payer: Self-pay | Admitting: *Deleted

## 2013-10-19 MED ORDER — LOSARTAN POTASSIUM 100 MG PO TABS: 100.0000 mg | ORAL_TABLET | Freq: Every day | ORAL | Status: DC

## 2013-10-19 MED ORDER — FUROSEMIDE 20 MG PO TABS: 20.0000 mg | ORAL_TABLET | Freq: Every day | ORAL | Status: DC

## 2013-10-19 NOTE — Telephone Encounter (Signed)
Prescription has been sent.

## 2013-10-19 NOTE — Telephone Encounter (Signed)
Losartan 100 mg daily #30 refill 11. BP check in 2 weeks

## 2013-11-06 DIAGNOSIS — Z1231 Encounter for screening mammogram for malignant neoplasm of breast: Secondary | ICD-10-CM | POA: Diagnosis not present

## 2013-11-08 ENCOUNTER — Telehealth: Payer: Self-pay | Admitting: *Deleted

## 2013-11-08 NOTE — Telephone Encounter (Signed)
Spouse was calling about husband, not self.

## 2013-11-17 ENCOUNTER — Telehealth: Payer: Self-pay | Admitting: *Deleted

## 2013-11-17 MED ORDER — AMLODIPINE BESYLATE 5 MG PO TABS: ORAL_TABLET | ORAL | Status: DC

## 2013-11-17 MED ORDER — LOSARTAN POTASSIUM 100 MG PO TABS: 100.0000 mg | ORAL_TABLET | Freq: Every day | ORAL | Status: DC

## 2013-11-17 NOTE — Telephone Encounter (Signed)
Patient phoned requesting refills for norvasc & cozaar.  Last OV with PCP 08/14/13.  Refilled per protocol while speaking to patient.

## 2013-11-21 ENCOUNTER — Other Ambulatory Visit: Payer: Self-pay

## 2013-11-21 MED ORDER — LOSARTAN POTASSIUM 100 MG PO TABS: 100.0000 mg | ORAL_TABLET | Freq: Every day | ORAL | Status: DC

## 2013-11-28 ENCOUNTER — Encounter: Payer: Self-pay | Admitting: Internal Medicine

## 2013-11-30 ENCOUNTER — Telehealth: Payer: Self-pay | Admitting: Family Medicine

## 2013-11-30 NOTE — Telephone Encounter (Signed)
LM for patient to CB to schedule appt °

## 2013-11-30 NOTE — Telephone Encounter (Signed)
Message copied by Nickola Major on Thu Nov 30, 2013 12:18 PM ------      Message from: Tammi Sou      Created: Fri Nov 10, 2013 12:54 PM       Great.      Appt in 6-8 wks to establish care would be good.      thx      ----- Message -----         From: Nickola Major         Sent: 11/09/2013  10:33 AM           To: Tammi Sou, MD            Patient would like to establish care with you. She would like to know when she should schedule her next OV? Thanks, Diane       ------

## 2013-12-01 NOTE — Telephone Encounter (Signed)
Patient scheduled appointment for 01/17/14

## 2014-01-15 DIAGNOSIS — H409 Unspecified glaucoma: Secondary | ICD-10-CM | POA: Diagnosis not present

## 2014-01-15 DIAGNOSIS — H4011X0 Primary open-angle glaucoma, stage unspecified: Secondary | ICD-10-CM | POA: Diagnosis not present

## 2014-01-15 DIAGNOSIS — Z961 Presence of intraocular lens: Secondary | ICD-10-CM | POA: Diagnosis not present

## 2014-01-16 ENCOUNTER — Ambulatory Visit: Payer: Medicare Other | Admitting: Family Medicine

## 2014-01-17 ENCOUNTER — Telehealth: Payer: Self-pay | Admitting: Family Medicine

## 2014-01-17 ENCOUNTER — Ambulatory Visit (INDEPENDENT_AMBULATORY_CARE_PROVIDER_SITE_OTHER): Payer: Medicare Other | Admitting: Family Medicine

## 2014-01-17 ENCOUNTER — Encounter: Payer: Self-pay | Admitting: Family Medicine

## 2014-01-17 VITALS — BP 176/70 | HR 68 | Temp 98.7°F | Resp 18 | Ht 60.0 in | Wt 155.0 lb

## 2014-01-17 DIAGNOSIS — H6120 Impacted cerumen, unspecified ear: Secondary | ICD-10-CM

## 2014-01-17 DIAGNOSIS — E785 Hyperlipidemia, unspecified: Secondary | ICD-10-CM

## 2014-01-17 DIAGNOSIS — I10 Essential (primary) hypertension: Secondary | ICD-10-CM | POA: Diagnosis not present

## 2014-01-17 LAB — LIPID PANEL
CHOLESTEROL: 176 mg/dL (ref 0–200)
HDL: 57.6 mg/dL (ref >39.00)
LDL Cholesterol: 91 mg/dL (ref 0–99)
Total CHOL/HDL Ratio: 3
Triglycerides: 139 mg/dL (ref 0.0–149.0)
VLDL: 27.8 mg/dL (ref 0.0–40.0)

## 2014-01-17 LAB — COMPREHENSIVE METABOLIC PANEL
ALBUMIN: 3.6 g/dL (ref 3.5–5.2)
ALT: 12 U/L (ref 0–35)
AST: 19 U/L (ref 0–37)
Alkaline Phosphatase: 78 U/L (ref 39–117)
BUN: 12 mg/dL (ref 6–23)
CO2: 28 meq/L (ref 19–32)
Calcium: 9.3 mg/dL (ref 8.4–10.5)
Chloride: 107 mEq/L (ref 96–112)
Creatinine, Ser: 0.6 mg/dL (ref 0.4–1.2)
GFR: 95.32 mL/min (ref >60.00)
Glucose, Bld: 85 mg/dL (ref 70–99)
POTASSIUM: 3.6 meq/L (ref 3.5–5.1)
SODIUM: 142 meq/L (ref 135–145)
TOTAL PROTEIN: 6.3 g/dL (ref 6.0–8.3)
Total Bilirubin: 0.7 mg/dL (ref 0.3–1.2)

## 2014-01-17 MED ORDER — LOSARTAN POTASSIUM 100 MG PO TABS: 100.0000 mg | ORAL_TABLET | Freq: Every day | ORAL | Status: DC

## 2014-01-17 NOTE — Progress Notes (Signed)
Office Note 01/17/2014  CC:  Chief Complaint  Patient presents with  . Establish Care    HPI:  Amy Turner is a 80 y.o. White female who is here to transfer care from Dr. Linda Hedges, who is retired now. Old records in [facility] EMR were reviewed prior to or during today's visit. "I'm 88 and I just go by my feelings, and I feel good".  Says right ear feeling full again lately, has hx of recurrent cerumen impactions.  Past Medical History  Diagnosis Date  . CERUMEN IMPACTION, BILATERAL 04/12/2008    Qualifier: Diagnosis of  By: Quentin Cornwall CMA, Janett Billow    . DEPRESSION, SITUATIONAL 03/26/2009    Qualifier: Diagnosis of  By: Linda Hedges MD, [place] 06/07/2007    Qualifier: Diagnosis of  By: Danny Lawless CMA, [place]    . HYPERLIPIDEMIA 06/07/2007    Qualifier: Diagnosis of  By: Danny Lawless CMA, [place]    . HYPERTENSION 06/07/2007    Qualifier: Diagnosis of  By: Danny Lawless CMA, [place]    . HYSTERECTOMY, HX OF 06/07/2007    Qualifier: Diagnosis of  By: Danny Lawless CMA, [place]    . Personal history of surgery to other organs 06/07/2007    Centricity Description: BLADDER REPAIR, HX OF Qualifier: Diagnosis of  By: [place], [place]   Centricity Description: LUMPECTOMY, BREAST, HX OF Qualifier: Diagnosis of  By: [place], [place]    . POSTMENOPAUSAL OSTEOPOROSIS 11/18/2010    Qualifier: Diagnosis of  By: Linda Hedges MD, Heinz Knuckles   . CARCINOMA, BREAST, LEFT 1993    Lumpectomy, then tamoxifen.  Gets annual mammograms.    Past Surgical History  Procedure Laterality Date  . Breast surgery  1993    lumpectomy  . Bladder repair    . Abdominal hysterectomy      Benign reasons per pt, but she can't recall exactly what dx.  Ovaries and tubes are still in.    . Eye surgery  01/03/2010    cataract surgery [facility]     Family History  Problem Relation Age of Onset  . Hyperlipidemia Mother   . Hypertension Mother   . Cervical cancer Mother   . Coronary artery disease Mother   . Depression Father   . Colon cancer  Neg Hx   . Diabetes Neg Hx     History   Social History  . Marital Status: Married    Spouse Name: N/A    Number of Children: 2  . Years of Education: 12   Occupational History  . homemaker    Social History Main Topics  . Smoking status: Never Smoker   . Smokeless tobacco: Never Used  . Alcohol Use: No  . Drug Use: No  . Sexual Activity: No   Other Topics Concern  . Not on file   Social History Narrative   Married January 03, 2049. 2 sons-'58, '33 oldest died CAD/MI; 2 grandchildren.  From [place], [place] originally.   Homemaker, retired from [employer].   Lives with husband and son lives at home. Little gardening, handcrafts. ACP/End-of-life Issues: Does not want CPR, mechanical ventilation or heroic measures. She has a living will.   Never smoker.  No alcohol.                   Outpatient Encounter Prescriptions as of 01/17/2014  Medication Sig  . amLODipine (NORVASC) 5 MG tablet TAKE 1 TABLET DAILY  . Calcium Carb-Cholecalciferol (CALCIUM 1000 + D) 1000-800 MG-UNIT TABS Take 1 tablet by  mouth daily.  . furosemide (LASIX) 20 MG tablet Take 1 tablet (20 mg total) by mouth daily.  . Garlic 3149 MG CAPS Take 1 capsule by mouth daily.  Marland Kitchen latanoprost (XALATAN) 0.005 % ophthalmic solution Place 1 drop into both eyes at bedtime.  Marland Kitchen losartan (COZAAR) 100 MG tablet Take 1 tablet (100 mg total) by mouth daily.  . metoprolol succinate (TOPROL-XL) 25 MG 24 hr tablet Take 1 tablet (25 mg total) by mouth daily.  . Multiple Vitamins-Minerals (ICAPS MV) TABS Take 1 tablet by mouth daily.  . rosuvastatin (CRESTOR) 5 MG tablet Take 1 tablet (5 mg total) by mouth at bedtime.  . [DISCONTINUED] losartan (COZAAR) 100 MG tablet Take 1 tablet (100 mg total) by mouth daily.  . [DISCONTINUED] fish oil-omega-3 fatty acids 1000 MG capsule Take 1 g by mouth daily.  . [DISCONTINUED] furosemide (LASIX) 40 MG tablet Take 0.5 tablets (20 mg total) by mouth daily.    No Known Allergies  ROS Review of  Systems  Constitutional: Negative for fever and fatigue.  HENT: Positive for hearing loss (right ear with hx of cerumen impaction and she says it is feeling "full" again). Negative for congestion and sore throat.   Eyes: Negative for visual disturbance.  Respiratory: Negative for cough.   Cardiovascular: Negative for chest pain.  Gastrointestinal: Negative for nausea and abdominal pain.  Genitourinary: Negative for dysuria.  Musculoskeletal: Negative for back pain and joint swelling.  Skin: Negative for rash.  Neurological: Negative for weakness and headaches.  Hematological: Negative for adenopathy.    PE; Blood pressure 176/70, pulse 68, temperature 98.7 F (37.1 C), temperature source Temporal, resp. rate 18, height 5' (1.524 m), weight 155 lb (70.308 kg), SpO2 95.00%.  Gen: Alert, well appearing.  Patient is oriented to person, place, time, and situation. AFFECT: pleasant, lucid thought and speech. ENT: Right EAC filled with soft yellow cerumen.  Left EAC with mild amount of cerumen, TM visible beyond and it is normal. CV: RRR, no m/r/g.   LUNGS: CTA bilat, nonlabored resps, good aeration in all lung fields. EXT: no clubbing or cyanosis.  Trace bilat pitting edema  Pertinent labs:  None today  ASSESSMENT AND PLAN:   Transfer pt:  HYPERTENSION BP up, unclear how well controlled her bp is.  INSTRUCTIONS: Check blood pressure and heart rate one to two times per day for the next 2 wks. Goal for top number is <140, but up to 160 is acceptable in your age range.  Goal for bottom number is <90. Write numbers down and bring in to office visit in approx 3 wks.   HYPERLIPIDEMIA The current medical regimen is effective;  continue present plan and medications. Repeat FLP today.  Cerumen impaction Right ear: cleared with curette by myself today + irrigation by CMA Jacklynn Ganong.   An After Visit Summary was printed and given to the patient.  Return in about 3 weeks (around  02/07/2014) for f/u HTN.

## 2014-01-17 NOTE — Assessment & Plan Note (Signed)
The current medical regimen is effective;  continue present plan and medications. Repeat FLP today.

## 2014-01-17 NOTE — Progress Notes (Signed)
Pre visit review using our clinic review tool, if applicable. No additional management support is needed unless otherwise documented below in the visit note. 

## 2014-01-17 NOTE — Assessment & Plan Note (Signed)
Right ear: cleared with curette by myself today + irrigation by CMA Jacklynn Ganong.

## 2014-01-17 NOTE — Telephone Encounter (Signed)
Relevant patient education mailed to patient.  

## 2014-01-17 NOTE — Assessment & Plan Note (Addendum)
BP up, unclear how well controlled her bp is.  INSTRUCTIONS: Check blood pressure and heart rate one to two times per day for the next 2 wks. Goal for top number is <140, but up to 160 is acceptable in your age range.  Goal for bottom number is <90. Write numbers down and bring in to office visit in approx 3 wks.

## 2014-01-17 NOTE — Patient Instructions (Addendum)
Check blood pressure and heart rate one to two times per day for the next 2 wks. Goal for top number is <140, but up to 160 is acceptable in your age range.  Goal for bottom number is <90. Write numbers down and bring in to office visit in approx 3 wks.

## 2014-01-18 ENCOUNTER — Other Ambulatory Visit: Payer: Self-pay | Admitting: Family Medicine

## 2014-01-18 MED ORDER — METOPROLOL SUCCINATE ER 25 MG PO TB24: 25.0000 mg | ORAL_TABLET | Freq: Every day | ORAL | Status: DC

## 2014-01-18 MED ORDER — FUROSEMIDE 20 MG PO TABS: 20.0000 mg | ORAL_TABLET | Freq: Every day | ORAL | Status: DC

## 2014-01-18 MED ORDER — ROSUVASTATIN CALCIUM 5 MG PO TABS: 5.0000 mg | ORAL_TABLET | Freq: Every day | ORAL | Status: DC

## 2014-01-24 ENCOUNTER — Other Ambulatory Visit: Payer: Self-pay | Admitting: Family Medicine

## 2014-01-24 MED ORDER — METOPROLOL SUCCINATE ER 25 MG PO TB24: 25.0000 mg | ORAL_TABLET | Freq: Every day | ORAL | Status: DC

## 2014-02-05 ENCOUNTER — Encounter (HOSPITAL_COMMUNITY): Payer: Self-pay | Admitting: Emergency Medicine

## 2014-02-05 ENCOUNTER — Emergency Department (HOSPITAL_COMMUNITY)
Admission: EM | Admit: 2014-02-05 | Discharge: 2014-02-05 | Disposition: A | Discharge status: Home / Self Care | Payer: Medicare Other | Attending: Emergency Medicine | Admitting: Emergency Medicine

## 2014-02-05 ENCOUNTER — Emergency Department (HOSPITAL_COMMUNITY): Payer: Medicare Other

## 2014-02-05 DIAGNOSIS — M47812 Spondylosis without myelopathy or radiculopathy, cervical region: Secondary | ICD-10-CM | POA: Diagnosis not present

## 2014-02-05 DIAGNOSIS — M810 Age-related osteoporosis without current pathological fracture: Secondary | ICD-10-CM | POA: Insufficient documentation

## 2014-02-05 DIAGNOSIS — Z853 Personal history of malignant neoplasm of breast: Secondary | ICD-10-CM | POA: Diagnosis not present

## 2014-02-05 DIAGNOSIS — Z90710 Acquired absence of both cervix and uterus: Secondary | ICD-10-CM | POA: Diagnosis not present

## 2014-02-05 DIAGNOSIS — Z9889 Other specified postprocedural states: Secondary | ICD-10-CM | POA: Diagnosis not present

## 2014-02-05 DIAGNOSIS — Z8659 Personal history of other mental and behavioral disorders: Secondary | ICD-10-CM | POA: Diagnosis not present

## 2014-02-05 DIAGNOSIS — R51 Headache: Secondary | ICD-10-CM | POA: Diagnosis not present

## 2014-02-05 DIAGNOSIS — M436 Torticollis: Secondary | ICD-10-CM | POA: Diagnosis not present

## 2014-02-05 DIAGNOSIS — Z79899 Other long term (current) drug therapy: Secondary | ICD-10-CM | POA: Insufficient documentation

## 2014-02-05 DIAGNOSIS — Z8669 Personal history of other diseases of the nervous system and sense organs: Secondary | ICD-10-CM | POA: Diagnosis not present

## 2014-02-05 DIAGNOSIS — H409 Unspecified glaucoma: Secondary | ICD-10-CM | POA: Insufficient documentation

## 2014-02-05 DIAGNOSIS — I10 Essential (primary) hypertension: Secondary | ICD-10-CM | POA: Diagnosis not present

## 2014-02-05 DIAGNOSIS — E785 Hyperlipidemia, unspecified: Secondary | ICD-10-CM | POA: Diagnosis not present

## 2014-02-05 DIAGNOSIS — M5030 Other cervical disc degeneration, unspecified cervical region: Secondary | ICD-10-CM | POA: Diagnosis not present

## 2014-02-05 DIAGNOSIS — Z8619 Personal history of other infectious and parasitic diseases: Secondary | ICD-10-CM | POA: Insufficient documentation

## 2014-02-05 DIAGNOSIS — R21 Rash and other nonspecific skin eruption: Secondary | ICD-10-CM | POA: Diagnosis not present

## 2014-02-05 MED ORDER — HYDROCODONE-ACETAMINOPHEN 5-325 MG PO TABS: 1.0000 | ORAL_TABLET | ORAL | Status: DC | PRN

## 2014-02-05 MED ORDER — MORPHINE SULFATE 2 MG/ML IJ SOLN
2.0000 mg | INTRAMUSCULAR | Status: DC | PRN
  Administered 2014-02-05: 2 mg via INTRAVENOUS
  Filled 2014-02-05: qty 1

## 2014-02-05 MED ORDER — CYCLOBENZAPRINE HCL 5 MG PO TABS: 5.0000 mg | ORAL_TABLET | Freq: Three times a day (TID) | ORAL | Status: DC | PRN

## 2014-02-05 NOTE — ED Notes (Signed)
Pt c/o right side neck pain, shoulder pain and posterior headache that started on Friday. Pt states that she has been taking aleve, using heat and applying muscle relief meds, states did help her sleep but still hurting.  Pt denies injury, lifting or moving anything that could have caused the pain.  Pt states that she just woke up with the pain.

## 2014-02-05 NOTE — Discharge Instructions (Signed)
Torticollis, Acute °You have suddenly (acutely) developed a twisted neck (torticollis). This is usually a self-limited condition. °CAUSES  °Acute torticollis may be caused by malposition, trauma or infection. Most commonly, acute torticollis is caused by sleeping in an awkward position. Torticollis may also be caused by the flexion, extension or twisting of the neck muscles beyond their normal position. Sometimes, the exact cause may not be known. °SYMPTOMS  °Usually, there is pain and limited movement of the neck. Your neck may twist to one side. °DIAGNOSIS  °The diagnosis is often made by physical examination. X-rays, CT scans or MRIs may be done if there is a history of trauma or concern of infection. °TREATMENT  °For a common, stiff neck that develops during sleep, treatment is focused on relaxing the contracted neck muscle. Medications (including shots) may be used to treat the problem. Most cases resolve in several days. Torticollis usually responds to conservative physical therapy. If left untreated, the shortened and spastic neck muscle can cause deformities in the face and neck. Rarely, surgery is required. °HOME CARE INSTRUCTIONS  °· Use over-the-counter and prescription medications as directed by your caregiver. °· Do stretching exercises and massage the neck as directed by your caregiver. °· Follow up with physical therapy if needed and as directed by your caregiver. °SEEK IMMEDIATE MEDICAL CARE IF:  °· You develop difficulty breathing or noisy breathing (stridor). °· You drool, develop trouble swallowing or have pain with swallowing. °· You develop numbness or weakness in the hands or feet. °· You have changes in speech or vision. °· You have problems with urination or bowel movements. °· You have difficulty walking. °· You have a fever. °· You have increased pain. °MAKE SURE YOU:  °· Understand these instructions. °· Will watch your condition. °· Will get help right away if you are not doing well or  get worse. °Document Released: 09/11/2000 Document Revised: 12/07/2011 Document Reviewed: 10/23/2009 °Exit[employer]® Patient Information ©2014 [employer]. ° °

## 2014-02-05 NOTE — ED Provider Notes (Signed)
CSN: [id number]     Arrival date & time 02/05/14  6440 History   First MD Initiated Contact with Patient 02/05/14 [phone]     Chief Complaint  Patient presents with  . Neck Pain  . Shoulder Pain  . Headache    HPI Pt started having pain on the right side of her neck moving to her shoulder that started on Friday.  She has been trying heating pads and nsaids without relief.  The pain has been getting worse.   The pain increases with trying to move her neck.  Her range of motion is very limited now.  No fevers.  No injuries.  No chest pain or shortness of breath.  No abdominal pain.  She denies any headache.  No prior neck problems in the past.   Pt is concerned about the possibility of it being related to a prior shingles attack in the past.  She has been asymptomatic for years though.  No recent rashes or illness.  Past Medical History  Diagnosis Date  . H/O impacted cerumen 04/12/2008    Qualifier: Diagnosis of  By: Quentin Cornwall CMA, Janett Billow    . DEPRESSION, SITUATIONAL 03/26/2009    Qualifier: Diagnosis of  By: Linda Hedges MD, [place] 06/07/2007    Qualifier: Diagnosis of  By: Danny Lawless CMA, [place]    . HYPERLIPIDEMIA 06/07/2007    Qualifier: Diagnosis of  By: Danny Lawless CMA, [place]    . HYPERTENSION 06/07/2007    Qualifier: Diagnosis of  By: Danny Lawless CMA, [place]    . HYSTERECTOMY, HX OF 06/07/2007    Qualifier: Diagnosis of  By: Danny Lawless CMA, [place]    . Personal history of surgery to other organs 06/07/2007    Centricity Description: BLADDER REPAIR, HX OF Qualifier: Diagnosis of  By: [place], [place]   Centricity Description: LUMPECTOMY, BREAST, HX OF Qualifier: Diagnosis of  By: [place], [place]    . POSTMENOPAUSAL OSTEOPOROSIS 11/18/2010    Qualifier: Diagnosis of  By: Linda Hedges MD, Heinz Knuckles   . CARCINOMA, BREAST, LEFT 1993    Lumpectomy, adjuvant XRT, then tamoxifen.  Gets annual mammograms.  . Baker's cyst of knee 02/25/2012    LE Venous Doppler May 30, '13 - non-vascular swelling/mass  left popliteal fossa c/w Baker's cyst    Past Surgical History  Procedure Laterality Date  . Breast surgery  1993    lumpectomy w/adjuvant XRT  . Bladder repair    . Abdominal hysterectomy      Benign reasons per pt, but she can't recall exactly what dx.  Ovaries and tubes are still in.    . Eye surgery  2011    cataract surgery [place]    Family History  Problem Relation Age of Onset  . Hyperlipidemia Mother   . Hypertension Mother   . Cervical cancer Mother   . Coronary artery disease Mother   . Depression Father   . Colon cancer Neg Hx   . Diabetes Neg Hx    History  Substance Use Topics  . Smoking status: Never Smoker   . Smokeless tobacco: Never Used  . Alcohol Use: No   OB History   Grav Para Term Preterm Abortions TAB SAB Ect Mult Living                 Review of Systems  All other systems reviewed and are negative.     Allergies  Review of patient's allergies indicates no known allergies.  Home Medications  Prior to Admission medications   Medication Sig Start Date End Date Taking? Authorizing Provider  amLODipine (NORVASC) 5 MG tablet TAKE 1 TABLET DAILY 11/17/13   Neena Rhymes, MD  Calcium Carb-Cholecalciferol (CALCIUM 1000 + D) 1000-800 MG-UNIT TABS Take 1 tablet by mouth daily.    Historical Provider, MD  furosemide (LASIX) 20 MG tablet Take 1 tablet (20 mg total) by mouth daily. 01/18/14   Tammi Sou, MD  Garlic 1610 MG CAPS Take 1 capsule by mouth daily.    Historical Provider, MD  latanoprost (XALATAN) 0.005 % ophthalmic solution Place 1 drop into both eyes at bedtime.    Historical Provider, MD  losartan (COZAAR) 100 MG tablet Take 1 tablet (100 mg total) by mouth daily. 01/17/14   Tammi Sou, MD  metoprolol succinate (TOPROL-XL) 25 MG 24 hr tablet Take 1 tablet (25 mg total) by mouth daily. 01/24/14   Tammi Sou, MD  Multiple Vitamins-Minerals (ICAPS MV) TABS Take 1 tablet by mouth daily. 08/14/13   Neena Rhymes, MD   rosuvastatin (CRESTOR) 5 MG tablet Take 1 tablet (5 mg total) by mouth at bedtime. 01/18/14   Tammi Sou, MD   BP 169/58  Pulse 84  Temp(Src) 98.6 F (37 C) (Oral)  Resp 20  SpO2 96% Physical Exam  Nursing note and vitals reviewed. Constitutional: She appears well-developed and well-nourished. No distress.  HENT:  Head: Normocephalic and atraumatic.  Right Ear: External ear normal.  Left Ear: External ear normal.  Eyes: Conjunctivae are normal. Right eye exhibits no discharge. Left eye exhibits no discharge. No scleral icterus.  Neck: Muscular tenderness present. No rigidity. Decreased range of motion present. No tracheal deviation, no edema and no erythema present. No mass present.  Cardiovascular: Normal rate, regular rhythm and intact distal pulses.   Pulmonary/Chest: Effort normal and breath sounds normal. No stridor. No respiratory distress. She has no wheezes. She has no rales.  Abdominal: Soft. Bowel sounds are normal. She exhibits no distension. There is no tenderness. There is no rebound and no guarding.  Musculoskeletal: She exhibits no edema and no tenderness.  Neurological: She is alert. She has normal strength. No cranial nerve deficit (no facial droop, extraocular movements intact, no slurred speech) or sensory deficit. She exhibits normal muscle tone. She displays no seizure activity. Coordination normal.  Skin: Skin is warm and dry. Rash noted. No lesion and no petechiae noted.  Psychiatric: She has a normal mood and affect.    ED Course  Procedures (including critical care time) Labs Review Labs Reviewed - No data to display  Imaging Review Ct Cervical Spine Wo Contrast  02/05/2014   CLINICAL DATA:  Right-sided neck pain.  Shoulder pain.  EXAM: CT CERVICAL SPINE WITHOUT CONTRAST  TECHNIQUE: Multidetector CT imaging of the cervical spine was performed without intravenous contrast. Multiplanar CT image reconstructions were also generated.  COMPARISON:  None.   FINDINGS: Normal alignment. Degenerative spurring noted from C3-4 through C6-7. Slight disc space narrowing.  Mild diffuse bilateral degenerative facet disease. No visible significant neural foraminal narrowing. Nerve root encroachment would be better evaluated with MRI. No fracture. No epidural or paraspinal hematoma.  IMPRESSION: Mild degenerative disc and facet disease. No acute bony abnormality.   Electronically Signed   By: Rolm Baptise M.D.   On: 02/05/2014 10:05     EKG Interpretation   Date/Time:  Monday Feb 05 2014 10:07:04 EDT Ventricular Rate:  89 PR Interval:  132 QRS Duration: 88 QT Interval:  361 QTC Calculation: 439 R Axis:   -4 Text Interpretation:  Sinus rhythm No previous tracing Confirmed by Dedrick Heffner   MD-J, Jivan Symanski ([id number]) on 02/05/2014 10:09:45 AM      MDM   Final diagnoses:  Torticollis  Degenerative disc disease, cervical    Suspect her pain is related to muscle spasm.  Doubt referred cardiac pain.  Still has normal strength and sensation in that extremity.  Will dc home with pain meds.  Follow up with PCP   Kathalene Frames, MD 02/05/14 1052

## 2014-02-12 ENCOUNTER — Encounter: Payer: Self-pay | Admitting: Family Medicine

## 2014-02-12 ENCOUNTER — Ambulatory Visit (INDEPENDENT_AMBULATORY_CARE_PROVIDER_SITE_OTHER): Payer: Medicare Other | Admitting: Family Medicine

## 2014-02-12 VITALS — BP 159/75 | HR 69 | Temp 99.0°F | Resp 18 | Ht 60.0 in | Wt 152.0 lb

## 2014-02-12 DIAGNOSIS — I10 Essential (primary) hypertension: Secondary | ICD-10-CM

## 2014-02-12 DIAGNOSIS — M542 Cervicalgia: Secondary | ICD-10-CM | POA: Insufficient documentation

## 2014-02-12 MED ORDER — HYDROCODONE-ACETAMINOPHEN 5-325 MG PO TABS: ORAL_TABLET | ORAL | Status: DC

## 2014-02-12 MED ORDER — DICLOFENAC SODIUM 75 MG PO TBEC: DELAYED_RELEASE_TABLET | ORAL | Status: DC

## 2014-02-12 NOTE — Progress Notes (Signed)
Pre visit review using our clinic review tool, if applicable. No additional management support is needed unless otherwise documented below in the visit note. 

## 2014-02-12 NOTE — Progress Notes (Signed)
OFFICE NOTE  02/12/2014  CC:  Chief Complaint  Patient presents with  . Hospitalization Follow-up  . Hypertension     HPI: Patient is a 80 y.o. Caucasian female who is here for 3 wk f/u HTN. FLP and CMET normal at last o/v.  Review of her home bp's done with wrist cuff show syst range 194R-740C, diastolics 14G occ but mostly 70s.  HR 50s-60s.  Was in ED 02/05/14 for sore neck (reviewed entire ED records today).  Pt was reassured that this is musculoskeletal, was given rx for cyclobenz and vicodin--was told to f/u with PMD.  CT C spine was done and showed mild cervical deg disc and facet dz.   Still having pain across right trap/paraspinous C spine muscles, also some on left side in similar area. Cyclobenz and vicodin helping some.  This pain does not interfere with her rest at night. She notices it the most when she uses her arms the most.  She doesn't recall much neck pain problems in the past.  No excessive lifting or repetitive motion.  No paresthesias down arms, no arm weakness.  No headaches.  Denies any recent hx of excessive mental/emotional stress.  Pertinent PMH:  Past medical, surgical, social, and family history reviewed and no changes are noted since last office visit.  MEDS:  Outpatient Prescriptions Prior to Visit  Medication Sig Dispense Refill  . amLODipine (NORVASC) 5 MG tablet Take 5 mg by mouth every morning.       . Calcium Carbonate-Vitamin D (CALCIUM + D PO) Take 1 tablet by mouth every morning.      . Cholecalciferol 1000 UNITS TBDP Take 1,000 Units by mouth every morning.      . cyclobenzaprine (FLEXERIL) 5 MG tablet Take 1 tablet (5 mg total) by mouth 3 (three) times daily as needed for muscle spasms.  21 tablet  0  . furosemide (LASIX) 20 MG tablet Take 20 mg by mouth every morning.       Marland Kitchen HYDROcodone-acetaminophen (NORCO/VICODIN) 5-325 MG per tablet Take 1 tablet by mouth every 4 (four) hours as needed.  16 tablet  0  . latanoprost (XALATAN) 0.005 %  ophthalmic solution Place 1 drop into both eyes at bedtime.      Marland Kitchen losartan (COZAAR) 100 MG tablet Take 1 tablet (100 mg total) by mouth daily.  90 tablet  2  . metoprolol succinate (TOPROL-XL) 25 MG 24 hr tablet Take 25 mg by mouth every morning.      . Multiple Vitamins-Minerals (ICAPS AREDS FORMULA PO) Take 2 capsules by mouth 2 (two) times daily.      Marland Kitchen OVER THE COUNTER MEDICATION Apply 1 application topically daily as needed (Applies to neck for pain.). Unker's Warden/ranger      . rosuvastatin (CRESTOR) 5 MG tablet Take 1 tablet (5 mg total) by mouth at bedtime.  90 tablet  3  . timolol (BETIMOL) 0.5 % ophthalmic solution Place 1 drop into both eyes every morning.      . naproxen sodium (ALEVE) 220 MG tablet Take 220 mg by mouth every 8 (eight) hours as needed (For pain.).       No facility-administered medications prior to visit.    PE: Blood pressure 159/75, pulse 69, temperature 99 F (37.2 C), temperature source Temporal, resp. rate 18, height 5' (1.524 m), weight 152 lb (68.947 kg), SpO2 94.00%. Gen: Alert, well appearing.  Patient is oriented to person, place, time, and situation. AFFECT: pleasant, lucid thought and  speech. Cervical spine: mildly decreased ROM + pain in extension, rotation, and lateral bending.  Flexion intact but mild pulling of soft tissues was felt in L spine diffusely.  Mild TTP in paraspinous mm's bilat and upper trapezius regions bilat, plus upper traps feel tight.  No mid or lower back tenderness or ROM symptoms.  IMPRESSION AND PLAN:  1) HTN: no changes.  Would like her to get upper arm bp cuff for home monitoring but she says she cannot afford it.  She will continue with wrist cuff.  2) Cervical muscles and trapezius mm's pain. Diclofenac 75mg  bid x 14d--with food. Vicodin 5/325, 1-2 tid prn severe pain, #30. PT referral.  Therapeutic expectations and side effect profile of medication discussed today.  Patient's questions  answered.  An After Visit Summary was printed and given to the patient.   FOLLOW UP: 1 mo

## 2014-02-14 DIAGNOSIS — M542 Cervicalgia: Secondary | ICD-10-CM | POA: Diagnosis not present

## 2014-02-16 DIAGNOSIS — M542 Cervicalgia: Secondary | ICD-10-CM | POA: Diagnosis not present

## 2014-02-21 DIAGNOSIS — M542 Cervicalgia: Secondary | ICD-10-CM | POA: Diagnosis not present

## 2014-02-26 DIAGNOSIS — M542 Cervicalgia: Secondary | ICD-10-CM | POA: Diagnosis not present

## 2014-03-21 ENCOUNTER — Ambulatory Visit: Payer: Medicare Other | Admitting: Family Medicine

## 2014-06-15 ENCOUNTER — Telehealth: Payer: Self-pay | Admitting: Family Medicine

## 2014-06-15 MED ORDER — ROSUVASTATIN CALCIUM 5 MG PO TABS: 5.0000 mg | ORAL_TABLET | Freq: Every day | ORAL | Status: DC

## 2014-06-15 NOTE — Telephone Encounter (Signed)
Caller name: Juliann Pulse from [facility] Relation to pt: Call back number: [phone] Pharmacy:  Reason for call:   Please call [facility]n [facility] at [facility] regarding patient's prescriptions. She states that patient is getting confused as to what medications she needs to get from the mail order and the local pharmacy.

## 2014-06-15 NOTE — Telephone Encounter (Signed)
Patient just wanted [place] to know that she now wants all of her medications to be sent Mail Order pharmacy.

## 2014-06-18 NOTE — Telephone Encounter (Signed)
This info for future only or is there a rx she wants now??-thx

## 2014-06-18 NOTE — Telephone Encounter (Signed)
Just an FYI

## 2014-06-22 DIAGNOSIS — H4011X0 Primary open-angle glaucoma, stage unspecified: Secondary | ICD-10-CM | POA: Diagnosis not present

## 2014-06-22 DIAGNOSIS — Z961 Presence of intraocular lens: Secondary | ICD-10-CM | POA: Diagnosis not present

## 2014-06-22 DIAGNOSIS — H409 Unspecified glaucoma: Secondary | ICD-10-CM | POA: Diagnosis not present

## 2014-06-22 DIAGNOSIS — H3530 Unspecified macular degeneration: Secondary | ICD-10-CM | POA: Diagnosis not present

## 2014-06-25 ENCOUNTER — Ambulatory Visit (INDEPENDENT_AMBULATORY_CARE_PROVIDER_SITE_OTHER): Payer: Medicare Other

## 2014-06-25 DIAGNOSIS — Z23 Encounter for immunization: Secondary | ICD-10-CM

## 2014-07-20 DIAGNOSIS — H4011X1 Primary open-angle glaucoma, mild stage: Secondary | ICD-10-CM | POA: Diagnosis not present

## 2014-08-22 ENCOUNTER — Telehealth: Payer: Self-pay | Admitting: Family Medicine

## 2014-08-22 ENCOUNTER — Ambulatory Visit: Payer: No Typology Code available for payment source | Admitting: Family Medicine

## 2014-08-22 MED ORDER — ALPRAZOLAM 0.5 MG PO TABS: ORAL_TABLET | ORAL | Status: DC

## 2014-08-22 NOTE — Telephone Encounter (Signed)
Patient requests Dr. Anitra Lauth to call her back. I advised that Amy Turner would probably call first & I encouraged patient to schedule an appointment this afternoon since our schedule is almost full. Patient scheduled an appointment but asked that someone call her back first.

## 2014-08-22 NOTE — Telephone Encounter (Signed)
Patient states that she is having some trouble in her family right now and her nerves are just shot.  Is there anything you can Rx or does she need to come in?  Patient states she has had anxiety attacks about 5 years ago but she doesn't remember what she took for them.  Please advise.

## 2014-08-22 NOTE — Telephone Encounter (Signed)
Agree. I did rx alprazolam 0.5mg , 1/2-1 tab tid prn, #30, RF x 1.

## 2014-08-22 NOTE — Telephone Encounter (Signed)
Rx sent into pharmacy per Dr. Anitra Lauth.  Patient is aware.

## 2014-09-14 ENCOUNTER — Telehealth: Payer: Self-pay | Admitting: Family Medicine

## 2014-09-14 NOTE — Telephone Encounter (Signed)
[name] stating she would like for someone to call her right away.   I called as soon as I got the message and let the phone ring for two minutes without answer.

## 2014-09-17 NOTE — Telephone Encounter (Signed)
[name] stating she would like CB.  Called pt again and phone rang and rang without answer.

## 2014-09-18 NOTE — Telephone Encounter (Signed)
Patient just wanted to talk about medication and schedule appointment.

## 2014-10-03 ENCOUNTER — Encounter: Payer: Self-pay | Admitting: Family Medicine

## 2014-10-03 ENCOUNTER — Ambulatory Visit (INDEPENDENT_AMBULATORY_CARE_PROVIDER_SITE_OTHER): Payer: Medicare Other | Admitting: Family Medicine

## 2014-10-03 VITALS — BP 193/72 | HR 55 | Temp 97.8°F | Resp 18 | Ht 60.0 in | Wt 151.0 lb

## 2014-10-03 DIAGNOSIS — I10 Essential (primary) hypertension: Secondary | ICD-10-CM

## 2014-10-03 DIAGNOSIS — Z79899 Other long term (current) drug therapy: Secondary | ICD-10-CM | POA: Diagnosis not present

## 2014-10-03 DIAGNOSIS — E785 Hyperlipidemia, unspecified: Secondary | ICD-10-CM | POA: Diagnosis not present

## 2014-10-03 NOTE — Progress Notes (Signed)
OFFICE NOTE  10/03/2014  CC:  Chief Complaint  Patient presents with  . Medication Management   HPI: Patient is a 81 y.o. Caucasian female who is here for f/u HTN, hyperlipidemia, med check. Under tremendous stress at home taking care of husband.  Not monitoring bp at home. She wanted to d/c any meds that were not essential today.  We spent considerable time reviewing med list and discussing the risks/benefits of each for her particular situation.  ROS: no focal weakness, no CP or SOB, no dizziness or palpitations, no nausea or abd pain   Pertinent PMH:  Past medical, surgical, social, and family history reviewed and no changes are noted since last office visit.  MEDS: Not taking vicodin or voltaren listed below Outpatient Prescriptions Prior to Visit  Medication Sig Dispense Refill  . ALPRAZolam (XANAX) 0.5 MG tablet Take 1/2 to 1 tab every 8 hours, PRN. 30 tablet 1  . amLODipine (NORVASC) 5 MG tablet Take 5 mg by mouth every morning.     . Calcium Carbonate-Vitamin D (CALCIUM + D PO) Take 1 tablet by mouth every morning.    . Cholecalciferol 1000 UNITS TBDP Take 1,000 Units by mouth every morning.    . furosemide (LASIX) 20 MG tablet Take 20 mg by mouth every morning.     . latanoprost (XALATAN) 0.005 % ophthalmic solution Place 1 drop into both eyes at bedtime.    Marland Kitchen losartan (COZAAR) 100 MG tablet Take 1 tablet (100 mg total) by mouth daily. 90 tablet 2  . metoprolol succinate (TOPROL-XL) 25 MG 24 hr tablet Take 25 mg by mouth every morning.    . Multiple Vitamins-Minerals (ICAPS AREDS FORMULA PO) Take 2 capsules by mouth 2 (two) times daily.    Marland Kitchen OVER THE COUNTER MEDICATION Apply 1 application topically daily as needed (Applies to neck for pain.). Unker's Warden/ranger    . rosuvastatin (CRESTOR) 5 MG tablet Take 1 tablet (5 mg total) by mouth at bedtime. 90 tablet 3  . timolol (BETIMOL) 0.5 % ophthalmic solution Place 1 drop into both eyes every morning.    .  diclofenac (VOLTAREN) 75 MG EC tablet 1 tab po bid with food for 14d (Patient not taking: Reported on 10/03/2014) 28 tablet 0  . HYDROcodone-acetaminophen (NORCO/VICODIN) 5-325 MG per tablet 1-2 tabs po tid prn pain (Patient not taking: Reported on 10/03/2014) 30 tablet 0   No facility-administered medications prior to visit.    PE: Blood pressure 193/72, pulse 55, temperature 97.8 F (36.6 C), temperature source Temporal, resp. rate 18, height 5' (1.524 m), weight 151 lb (68.493 kg), SpO2 96 %. Gen: Alert, well appearing.  Patient is oriented to person, place, time, and situation. CV: RRR, occ ectopic beat, no murmur/rub/gallop LUNGS: CTA bilat, nonlabored  LABS; none today RECENT: Lab Results  Component Value Date   CHOL 176 01/17/2014   HDL 57.60 01/17/2014   LDLCALC 91 01/17/2014   LDLDIRECT 124.7 11/14/2010   TRIG 139.0 01/17/2014   CHOLHDL 3 01/17/2014     Chemistry      Component Value Date/Time   NA 142 01/17/2014 1129   K 3.6 01/17/2014 1129   CL 107 01/17/2014 1129   CO2 28 01/17/2014 1129   BUN 12 01/17/2014 1129   CREATININE 0.6 01/17/2014 1129      Component Value Date/Time   CALCIUM 9.3 01/17/2014 1129   ALKPHOS 78 01/17/2014 1129   AST 19 01/17/2014 1129   ALT 12 01/17/2014 1129  BILITOT 0.7 01/17/2014 1129      IMPRESSION AND PLAN:  1) HTN: monitor bp at home and call numbers to our office in 10d.  No bp med changes made today.  2) Hyperlipidemia: pt elected to go OFF of her statin, as we felt risk>benefit for her at this time. D/c'd crestor today.  3) Polypharmacy: reviewed med list and discussed risk/benefit of each med.   Ended up d/c'ing furosemide as well as crestor as noted in #2 above.  Spent 25 min with pt today, with >50% of this time spent in counseling and care coordination regarding the above problems.  An After Visit Summary was printed and given to the patient.  FOLLOW UP: 4 mo, fasting routine chronic med prob f/u

## 2014-10-03 NOTE — Patient Instructions (Signed)
Stop furosemide (lasix) and crestor. Monitor blood pressure and heart rate once a day for 10d and call these numbers to nurse Lattie Haw.

## 2014-10-03 NOTE — Progress Notes (Signed)
Pre visit review using our clinic review tool, if applicable. No additional management support is needed unless otherwise documented below in the visit note. 

## 2014-10-11 ENCOUNTER — Telehealth: Payer: Self-pay | Admitting: *Deleted

## 2014-10-11 NOTE — Telephone Encounter (Signed)
Patient left vm requesting cb concerning questions she has about her meds.

## 2014-10-12 MED ORDER — LOSARTAN POTASSIUM 100 MG PO TABS: 100.0000 mg | ORAL_TABLET | Freq: Every day | ORAL | Status: DC

## 2014-10-12 NOTE — Telephone Encounter (Signed)
Patient needed refill on her losartan.

## 2014-11-05 ENCOUNTER — Other Ambulatory Visit: Payer: Self-pay | Admitting: Family Medicine

## 2014-11-05 MED ORDER — LOSARTAN POTASSIUM 100 MG PO TABS: 100.0000 mg | ORAL_TABLET | Freq: Every day | ORAL | Status: DC

## 2014-11-06 ENCOUNTER — Other Ambulatory Visit: Payer: Self-pay | Admitting: Family Medicine

## 2014-11-06 MED ORDER — LOSARTAN POTASSIUM 100 MG PO TABS: 100.0000 mg | ORAL_TABLET | Freq: Every day | ORAL | Status: DC

## 2014-11-07 DIAGNOSIS — Z853 Personal history of malignant neoplasm of breast: Secondary | ICD-10-CM | POA: Diagnosis not present

## 2014-11-07 DIAGNOSIS — Z1231 Encounter for screening mammogram for malignant neoplasm of breast: Secondary | ICD-10-CM | POA: Diagnosis not present

## 2014-12-14 DIAGNOSIS — H4011X1 Primary open-angle glaucoma, mild stage: Secondary | ICD-10-CM | POA: Diagnosis not present

## 2014-12-19 ENCOUNTER — Encounter: Payer: Self-pay | Admitting: Family Medicine

## 2014-12-26 ENCOUNTER — Other Ambulatory Visit: Payer: Self-pay | Admitting: *Deleted

## 2014-12-26 MED ORDER — METOPROLOL SUCCINATE ER 25 MG PO TB24: 25.0000 mg | ORAL_TABLET | Freq: Every morning | ORAL | Status: DC

## 2015-01-31 ENCOUNTER — Ambulatory Visit (INDEPENDENT_AMBULATORY_CARE_PROVIDER_SITE_OTHER): Payer: Medicare Other | Admitting: Family Medicine

## 2015-01-31 ENCOUNTER — Encounter: Payer: Self-pay | Admitting: Family Medicine

## 2015-01-31 VITALS — BP 164/78 | HR 56 | Temp 98.2°F | Resp 16 | Wt 149.0 lb

## 2015-01-31 DIAGNOSIS — Z23 Encounter for immunization: Secondary | ICD-10-CM | POA: Diagnosis not present

## 2015-01-31 DIAGNOSIS — Z0000 Encounter for general adult medical examination without abnormal findings: Secondary | ICD-10-CM

## 2015-01-31 DIAGNOSIS — W57XXXA Bitten or stung by nonvenomous insect and other nonvenomous arthropods, initial encounter: Secondary | ICD-10-CM

## 2015-01-31 DIAGNOSIS — I10 Essential (primary) hypertension: Secondary | ICD-10-CM | POA: Diagnosis not present

## 2015-01-31 DIAGNOSIS — T148 Other injury of unspecified body region: Secondary | ICD-10-CM

## 2015-01-31 LAB — BASIC METABOLIC PANEL
BUN: 13 mg/dL (ref 6–23)
CO2: 29 mEq/L (ref 19–32)
Calcium: 9.9 mg/dL (ref 8.4–10.5)
Chloride: 107 mEq/L (ref 96–112)
Creatinine, Ser: 0.71 mg/dL (ref 0.40–1.20)
GFR: 82.84 mL/min (ref >60.00)
Glucose, Bld: 113 mg/dL — ABNORMAL HIGH (ref 70–99)
POTASSIUM: 4.6 meq/L (ref 3.5–5.1)
SODIUM: 141 meq/L (ref 135–145)

## 2015-01-31 NOTE — Patient Instructions (Signed)
Take any remaining amlodipine 5mg  tabs TWO at a time ONCE DAILY until the current bottle is finished. I'll send in prescription for amlodipine 10mg  tabs to start taking when you are out of the 5mg  tabs.

## 2015-01-31 NOTE — Progress Notes (Signed)
OFFICE NOTE  01/31/2015  CC:  Chief Complaint  Patient presents with  . Follow-up     HPI: Patient is a 81 y.o. Caucasian female who is here for 4 mo f/u HTN, hyperlipidemia, med check, anx/dep.  Has about 15 home bp checks to review since last visit, systolic avg 161-096, diastolic avg 04V, HR avg 40J.   Says she is under more stress last 6 mo or so b/c brother dx'd with dementia, had to go into NH. She denies depressed mood.  Xanax is on her med list but she says she has not taken any of this for years.  Compliant with meds, says she eats well, sleeps well.  Stays active during her days.  Hyperlip: decided last visit that statin risk>benefit for her and so we d/c'd this med.  Last few days has noted a small itchy red splotch on R side of abdomen.  No pain or burning. Has had similar bug bite type lesions recently.  She thinks she pulled off a small tick from the one on right flank.  No fevers/malaise or HA's or myalgias.   Pertinent PMH:  Past medical, surgical, social, and family history reviewed and no changes are noted since last office visit.  MEDS:  Outpatient Prescriptions Prior to Visit  Medication Sig Dispense Refill  . ALPRAZolam (XANAX) 0.5 MG tablet Take 1/2 to 1 tab every 8 hours, PRN. (Patient not taking: Reported on 01/31/2015) 30 tablet 1  . amLODipine (NORVASC) 5 MG tablet Take 5 mg by mouth every morning.     . Calcium Carbonate-Vitamin D (CALCIUM + D PO) Take 1 tablet by mouth every morning.    . Cholecalciferol 1000 UNITS TBDP Take 1,000 Units by mouth every morning.    . dorzolamide-timolol (COSOPT) 22.3-6.8 MG/ML ophthalmic solution   12  . latanoprost (XALATAN) 0.005 % ophthalmic solution Place 1 drop into both eyes at bedtime.    Marland Kitchen losartan (COZAAR) 100 MG tablet Take 1 tablet (100 mg total) by mouth daily. 90 tablet 1  . metoprolol succinate (TOPROL-XL) 25 MG 24 hr tablet Take 1 tablet (25 mg total) by mouth every morning. 90 tablet 1  . Multiple  Vitamins-Minerals (ICAPS AREDS FORMULA PO) Take 2 capsules by mouth 2 (two) times daily.    Marland Kitchen OVER THE COUNTER MEDICATION Apply 1 application topically daily as needed (Applies to neck for pain.). Unker's Multi-purpose Therapeutic Salve    . timolol (BETIMOL) 0.5 % ophthalmic solution Place 1 drop into both eyes every morning.     No facility-administered medications prior to visit.    PE: Blood pressure 164/78, pulse 56, temperature 98.2 F (36.8 C), temperature source Temporal, resp. rate 16, weight 149 lb (67.586 kg), SpO2 99 %. Gen: Alert, well appearing.  Patient is oriented to person, place, time, and situation. Right lower abd region with nickel-sized pinkish splotch/macule c/w mildly irritated insect bite.  No tenderness or warmth or nodularity.  LAB:   Chemistry      Component Value Date/Time   NA 142 01/17/2014 1129   K 3.6 01/17/2014 1129   CL 107 01/17/2014 1129   CO2 28 01/17/2014 1129   BUN 12 01/17/2014 1129   CREATININE 0.6 01/17/2014 1129      Component Value Date/Time   CALCIUM 9.3 01/17/2014 1129   ALKPHOS 78 01/17/2014 1129   AST 19 01/17/2014 1129   ALT 12 01/17/2014 1129   BILITOT 0.7 01/17/2014 1129      IMPRESSION AND PLAN:  1)  HTN, not ideal control.  Increase amlodipine to 10mg  qd and continue current dosing of cozaar and metoprolol.  If not well controlled in 6 wks on this regimen then will add HCTZ. Check BMET today.  2) Tick bite: reassured, no treatment necessary.  Discussed prevention of tick bites, signs of tick borne illness to call or return for.  3) Prev health care: prevnar 13 IM today.  An After Visit Summary was printed and given to the patient.  FOLLOW UP: 6-8 wks f/u HTN

## 2015-01-31 NOTE — Progress Notes (Signed)
Pre visit review using our clinic review tool, if applicable. No additional management support is needed unless otherwise documented below in the visit note. 

## 2015-02-01 ENCOUNTER — Other Ambulatory Visit: Payer: Self-pay | Admitting: Family Medicine

## 2015-02-01 ENCOUNTER — Other Ambulatory Visit: Payer: Self-pay | Admitting: *Deleted

## 2015-02-01 MED ORDER — AMLODIPINE BESYLATE 10 MG PO TABS: 10.0000 mg | ORAL_TABLET | Freq: Every day | ORAL | Status: DC

## 2015-02-01 NOTE — Telephone Encounter (Signed)
error 

## 2015-02-01 NOTE — Telephone Encounter (Signed)
Please ignore error message. It was typed in error. Thanks.

## 2015-02-01 NOTE — Telephone Encounter (Signed)
Pt called stating that she was confused about her medications. After reviewing her chart I advised her that the only change to her medications was Dr. Anitra Lauth increased her amlodipine to 10mg  daily. I advised her that she could take two of her 5mg  tablets until we send in new Rx to [facility] since she is about out of the 5mg . Please review. Thanks.

## 2015-02-08 ENCOUNTER — Encounter: Payer: Self-pay | Admitting: *Deleted

## 2015-03-20 ENCOUNTER — Ambulatory Visit: Payer: Medicare Other | Admitting: Family Medicine

## 2015-03-27 ENCOUNTER — Encounter: Payer: Self-pay | Admitting: Family Medicine

## 2015-03-27 ENCOUNTER — Ambulatory Visit (INDEPENDENT_AMBULATORY_CARE_PROVIDER_SITE_OTHER): Payer: Medicare Other | Admitting: Family Medicine

## 2015-03-27 VITALS — BP 160/82 | HR 60 | Temp 97.8°F | Resp 16 | Ht 60.0 in | Wt 149.0 lb

## 2015-03-27 DIAGNOSIS — H6121 Impacted cerumen, right ear: Secondary | ICD-10-CM | POA: Diagnosis not present

## 2015-03-27 DIAGNOSIS — I10 Essential (primary) hypertension: Secondary | ICD-10-CM | POA: Diagnosis not present

## 2015-03-27 MED ORDER — HYDROCHLOROTHIAZIDE 25 MG PO TABS: 25.0000 mg | ORAL_TABLET | Freq: Every day | ORAL | Status: DC

## 2015-03-27 NOTE — Patient Instructions (Signed)
Check your blood pressure and heart rate 3 times per week and bring these numbers to your next visit with me in 1 month.

## 2015-03-27 NOTE — Progress Notes (Signed)
OFFICE NOTE  03/27/2015  CC:  Chief Complaint  Patient presents with  . Follow-up    5-6 week f/u. Pt is not fasting.    HPI: Patient is a 81 y.o. Caucasian female who is here for 2 mo f/u HTN. Feeling well, minimal bp monitoring since last visit due to life being too busy. No HA's or dizziness.  Rare bp check 161W systolic is all she recalls.   Compliant with all meds w/out side effects.  Says R ear feels like it is full of wax.  Pertinent PMH:  Past medical, surgical, social, and family history reviewed and no changes are noted since last office visit.  MEDS:  Outpatient Prescriptions Prior to Visit  Medication Sig Dispense Refill  . amLODipine (NORVASC) 10 MG tablet Take 1 tablet (10 mg total) by mouth daily. 30 tablet 1  . BIOTIN PO Take 1 tablet by mouth daily.    . Calcium Carbonate-Vitamin D (CALCIUM + D PO) Take 1 tablet by mouth every morning.    . Cholecalciferol 1000 UNITS TBDP Take 1,000 Units by mouth every morning.    . dorzolamide-timolol (COSOPT) 22.3-6.8 MG/ML ophthalmic solution   12  . latanoprost (XALATAN) 0.005 % ophthalmic solution Place 1 drop into both eyes at bedtime.    Marland Kitchen losartan (COZAAR) 100 MG tablet Take 1 tablet (100 mg total) by mouth daily. 90 tablet 1  . metoprolol succinate (TOPROL-XL) 25 MG 24 hr tablet Take 1 tablet (25 mg total) by mouth every morning. 90 tablet 1  . Multiple Vitamins-Minerals (ICAPS AREDS FORMULA PO) Take 2 capsules by mouth 2 (two) times daily.    Marland Kitchen OVER THE COUNTER MEDICATION Apply 1 application topically daily as needed (Applies to neck for pain.). Unker's Warden/ranger    . ALPRAZolam (XANAX) 0.5 MG tablet Take 1/2 to 1 tab every 8 hours, PRN. (Patient not taking: Reported on 01/31/2015) 30 tablet 1  . timolol (BETIMOL) 0.5 % ophthalmic solution Place 1 drop into both eyes every morning.     No facility-administered medications prior to visit.    PE: Blood pressure 160/82, pulse 60, temperature 97.8 F  (36.6 C), temperature source Oral, resp. rate 16, height 5' (1.524 m), weight 149 lb (67.586 kg), SpO2 96 %. Gen: Alert, well appearing.  Patient is oriented to person, place, time, and situation. Ears: R EAC with 95% cerumen impaction, soft/mushy yellow cerumen that I could not extract with my curette. Nurse irrigated R EAC CV: RRR, no m/r/g.   LUNGS: CTA bilat, nonlabored resps, good aeration in all lung fields. EXT: no clubbing, cyanosis, or edema.    LABS:   Chemistry      Component Value Date/Time   NA 141 01/31/2015 1107   K 4.6 01/31/2015 1107   CL 107 01/31/2015 1107   CO2 29 01/31/2015 1107   BUN 13 01/31/2015 1107   CREATININE 0.71 01/31/2015 1107      Component Value Date/Time   CALCIUM 9.9 01/31/2015 1107   ALKPHOS 78 01/17/2014 1129   AST 19 01/17/2014 1129   ALT 12 01/17/2014 1129   BILITOT 0.7 01/17/2014 1129       IMPRESSION AND PLAN:  1) uncontrolled HTN; continue current meds and add HCTZ 25mg  qd today. F/u office visit to review home bps and recheck BMET in 1 mo.  2) Cerumen impaction: Right side. Nurse irrigated this today.  An After Visit Summary was printed and given to the patient.  FOLLOW UP: 1 mo

## 2015-03-27 NOTE — Progress Notes (Signed)
Pre visit review using our clinic review tool, if applicable. No additional management support is needed unless otherwise documented below in the visit note. 

## 2015-04-15 ENCOUNTER — Other Ambulatory Visit: Payer: Self-pay | Admitting: *Deleted

## 2015-04-15 DIAGNOSIS — H4011X1 Primary open-angle glaucoma, mild stage: Secondary | ICD-10-CM | POA: Diagnosis not present

## 2015-04-15 DIAGNOSIS — H3531 Nonexudative age-related macular degeneration: Secondary | ICD-10-CM | POA: Diagnosis not present

## 2015-04-15 DIAGNOSIS — H26491 Other secondary cataract, right eye: Secondary | ICD-10-CM | POA: Diagnosis not present

## 2015-04-15 DIAGNOSIS — Z961 Presence of intraocular lens: Secondary | ICD-10-CM | POA: Diagnosis not present

## 2015-04-15 MED ORDER — AMLODIPINE BESYLATE 10 MG PO TABS: 10.0000 mg | ORAL_TABLET | Freq: Every day | ORAL | Status: DC

## 2015-04-15 NOTE — Telephone Encounter (Signed)
RF request for amlodipine LOV: 03/27/15 Next ov: 04/25/15 Last written: 02/01/15 #30 w/ 1RF

## 2015-04-18 ENCOUNTER — Other Ambulatory Visit: Payer: Self-pay | Admitting: *Deleted

## 2015-04-18 MED ORDER — AMLODIPINE BESYLATE 10 MG PO TABS: 10.0000 mg | ORAL_TABLET | Freq: Every day | ORAL | Status: DC

## 2015-04-18 NOTE — Telephone Encounter (Signed)
[name] wanting to discuss some things about her medications. Spoke to pt and she stated that she wanted to have her amlodipine sent to local [facility] pharmacy for #90. Rx sent for #90 w/ 1RF. Pt advised and voiced understanding.

## 2015-04-25 ENCOUNTER — Encounter: Payer: Self-pay | Admitting: Family Medicine

## 2015-04-25 ENCOUNTER — Ambulatory Visit (INDEPENDENT_AMBULATORY_CARE_PROVIDER_SITE_OTHER): Payer: Medicare Other | Admitting: Family Medicine

## 2015-04-25 VITALS — BP 138/66 | HR 69 | Temp 98.0°F | Resp 16 | Ht 60.0 in | Wt 151.0 lb

## 2015-04-25 DIAGNOSIS — I10 Essential (primary) hypertension: Secondary | ICD-10-CM

## 2015-04-25 DIAGNOSIS — M7022 Olecranon bursitis, left elbow: Secondary | ICD-10-CM | POA: Diagnosis not present

## 2015-04-25 LAB — BASIC METABOLIC PANEL
BUN: 13 mg/dL (ref 6–23)
CALCIUM: 9.7 mg/dL (ref 8.4–10.5)
CHLORIDE: 106 meq/L (ref 96–112)
CO2: 30 mEq/L (ref 19–32)
CREATININE: 0.74 mg/dL (ref 0.40–1.20)
GFR: 78.93 mL/min (ref >60.00)
GLUCOSE: 79 mg/dL (ref 70–99)
Potassium: 4.7 mEq/L (ref 3.5–5.1)
SODIUM: 143 meq/L (ref 135–145)

## 2015-04-25 NOTE — Progress Notes (Signed)
Pre visit review using our clinic review tool, if applicable. No additional management support is needed unless otherwise documented below in the visit note. 

## 2015-04-25 NOTE — Progress Notes (Signed)
OFFICE NOTE  04/25/2015  CC:  Chief Complaint  Patient presents with  . Follow-up    1 month f/u. Pt is not fasting.   HPI: Patient is a 81 y.o. Caucasian female who is here for 1 mo f/u HTN. Started HCTZ 25mg  last visit, needs BMET today.  Tolerating med well.  Noted onset of L elbow swelling about a week ago, has come down some, was tender to touch over olecranon.  She recalls no trauma or other potential inciting factor.  She has had mild diffuse swelling of this arm ever since having breast surgery on that side + axillary LN dissection on L.   Pertinent PMH:  Past medical, surgical, social, and family history reviewed and no changes are noted since last of=fice visit.  MEDS:  Outpatient Prescriptions Prior to Visit  Medication Sig Dispense Refill  . amLODipine (NORVASC) 10 MG tablet Take 1 tablet (10 mg total) by mouth daily. 90 tablet 1  . BIOTIN PO Take 1 tablet by mouth daily.    . Calcium Carbonate-Vitamin D (CALCIUM + D PO) Take 1 tablet by mouth every morning.    . Cholecalciferol 1000 UNITS TBDP Take 1,000 Units by mouth every morning.    . dorzolamide-timolol (COSOPT) 22.3-6.8 MG/ML ophthalmic solution   12  . hydrochlorothiazide (HYDRODIURIL) 25 MG tablet Take 1 tablet (25 mg total) by mouth daily. 30 tablet 11  . latanoprost (XALATAN) 0.005 % ophthalmic solution Place 1 drop into both eyes at bedtime.    Marland Kitchen losartan (COZAAR) 100 MG tablet Take 1 tablet (100 mg total) by mouth daily. 90 tablet 1  . metoprolol succinate (TOPROL-XL) 25 MG 24 hr tablet Take 1 tablet (25 mg total) by mouth every morning. 90 tablet 1  . Multiple Vitamins-Minerals (ICAPS AREDS FORMULA PO) Take 2 capsules by mouth 2 (two) times daily.    Marland Kitchen OVER THE COUNTER MEDICATION Apply 1 application topically daily as needed (Applies to neck for pain.). Unker's Multi-purpose Therapeutic Salve     No facility-administered medications prior to visit.    PE: Blood pressure 146/78, pulse 69, temperature 98  F (36.7 C), temperature source Oral, resp. rate 16, height 5' (1.524 m), weight 151 lb (68.493 kg), SpO2 98 %.  BP recheck (manual) today was 138/66. Gen: Alert, well appearing.  Patient is oriented to person, place, time, and situation. L olecranon process TTP diffusely, with mild erythema and swelling of olecranon bursa and soft tissue just proximal to the bursa. Entire L arm diffusely swollen compared to R but no edema.  IMPRESSION AND PLAN:  1) HTN; control is good now (goal for her is 140s/90s or better and she is there).  Check BMET today and continue current meds.  2) Olecranon bursitis, improving w/out intervention.  For residual swelling/tenderness, apply ice with mild compression wrap. Call or return if worsens or doesn't completely resolve in 7-10d.  An After Visit Summary was printed and given to the patient.  FOLLOW UP: 32mo AWV

## 2015-06-04 ENCOUNTER — Other Ambulatory Visit: Payer: Self-pay | Admitting: *Deleted

## 2015-06-04 MED ORDER — HYDROCHLOROTHIAZIDE 25 MG PO TABS: 25.0000 mg | ORAL_TABLET | Freq: Every day | ORAL | Status: DC

## 2015-06-04 NOTE — Telephone Encounter (Signed)
RF request for hctz, request for 90 day LOV: 04/25/15 Next ov: 10/24/15 Last written: 03/27/15 #30 w/ 11RF

## 2015-08-07 DIAGNOSIS — H401124 Primary open-angle glaucoma, left eye, indeterminate stage: Secondary | ICD-10-CM | POA: Diagnosis not present

## 2015-08-07 DIAGNOSIS — H401114 Primary open-angle glaucoma, right eye, indeterminate stage: Secondary | ICD-10-CM | POA: Diagnosis not present

## 2015-09-16 ENCOUNTER — Other Ambulatory Visit: Payer: Self-pay | Admitting: *Deleted

## 2015-09-16 MED ORDER — METOPROLOL SUCCINATE ER 25 MG PO TB24: 25.0000 mg | ORAL_TABLET | Freq: Every morning | ORAL | Status: DC

## 2015-09-16 NOTE — Telephone Encounter (Signed)
RF request for metoprolol LOV: 04/25/15 Next ov: 10/24/15 Last written: 12/26/14 #90 w/ 1RF

## 2015-10-21 ENCOUNTER — Ambulatory Visit (INDEPENDENT_AMBULATORY_CARE_PROVIDER_SITE_OTHER): Payer: Medicare Other | Admitting: Family Medicine

## 2015-10-21 ENCOUNTER — Encounter: Payer: Self-pay | Admitting: Family Medicine

## 2015-10-21 VITALS — BP 127/79 | HR 67 | Temp 97.9°F | Resp 16 | Ht 60.0 in | Wt 153.2 lb

## 2015-10-21 DIAGNOSIS — Z0000 Encounter for general adult medical examination without abnormal findings: Secondary | ICD-10-CM | POA: Diagnosis not present

## 2015-10-21 MED ORDER — AMLODIPINE BESYLATE 10 MG PO TABS
10.0000 mg | ORAL_TABLET | Freq: Every day | ORAL | Status: DC
Start: 2015-10-21 — End: 2017-01-18

## 2015-10-21 MED ORDER — HYDROCHLOROTHIAZIDE 25 MG PO TABS: 25.0000 mg | ORAL_TABLET | Freq: Every day | ORAL | Status: DC

## 2015-10-21 MED ORDER — LOSARTAN POTASSIUM 100 MG PO TABS: 100.0000 mg | ORAL_TABLET | Freq: Every day | ORAL | Status: DC

## 2015-10-21 NOTE — Progress Notes (Signed)
The patient is here for annual Medicare wellness examination and management of other chronic and acute problems. Other problems discussed today: none.    AWV DATA The risk factors are reflected in the social history.  The roster of all physicians providing medical care to patient is listed in the Snapshot section of the chart.  Activities of daily living:  The patient is 100% independent in all ADLs: dressing, toileting, feeding as well as independent mobility.  Home safety : The patient has smoke detectors in the home. They wear seatbelts. No firearms at home ( firearms are present in the home, kept in a safe fashion). There is no violence in the home.   There is no risks for hepatitis, STDs or HIV. There is no history of blood transfusion. They have no travel history to infectious disease endemic areas of the world.  The patient has seen their dentist in the last six months. They have seen their eye doctor in the last year. They deny any hearing difficulty and have not had audiologic testing in the last year.  They do not  have excessive sun exposure. Discussed the need for sun protection: hats, long sleeves and use of sunscreen if there is significant sun exposure.   Diet: the importance of a healthy diet is discussed. They do have a healthy diet.  The patient does not have a regular exercise program.   The benefits of regular aerobic exercise were discussed.  Depression screen: there are no signs or vegative symptoms of depression- irritability, change in appetite, anhedonia, sadness/tearfullness.  Cognitive assessment: the patient manages all their financial and personal affairs and is actively engaged. They could relate day,date,year and events; recalled 3/3 objects at 3 minutes; performed clock-face test normally.  The following portions of the patient's history were reviewed and updated as appropriate: allergies, current medications, past family history, past medical history,  past  surgical history, past social history  and problem list.  Vision, hearing, body mass index were assessed and reviewed.   During the course of the visit the patient was educated and counseled about appropriate screening and preventive services including (as applicable):   Annual wellness visit  diabetes screening colorectal cancer screening recommended immunizations (influenza, pneumococcal, Hep B) Bone mass measurement Counseling to prevent tobacco use Depression screening Glaucoma screening Hepatitis C virus screening HIV virus screening Lung cancer screening Medical nutrition therapy Prostate cancer screening Screening mammography Screening pap tests, pelvic exam, and clinical breast exam Ultrasound screening for AAA  A written plan of action regarding the above screening and preventative services was given to the patient today. She will not need any of the above preventative/screening services between now and the next time I see her in 6 mo. Furthermore, she has decided to stop having screening mammograms. We also discussed aspirin use for primary CV/CVA prevention and she declines this, stating she has never been told to take this in the past and she feels like starting this now doesn't make any sense.  I am ok with this.  An After Visit Summary was printed and given to the patient.

## 2015-10-21 NOTE — Progress Notes (Signed)
Pre visit review using our clinic review tool, if applicable. No additional management support is needed unless otherwise documented below in the visit note. 

## 2015-10-24 ENCOUNTER — Ambulatory Visit: Payer: Medicare Other | Admitting: Family Medicine

## 2015-11-06 DIAGNOSIS — H401122 Primary open-angle glaucoma, left eye, moderate stage: Secondary | ICD-10-CM | POA: Diagnosis not present

## 2015-11-06 DIAGNOSIS — H401112 Primary open-angle glaucoma, right eye, moderate stage: Secondary | ICD-10-CM | POA: Diagnosis not present

## 2015-12-12 ENCOUNTER — Telehealth: Payer: Self-pay | Admitting: Family Medicine

## 2015-12-12 NOTE — Telephone Encounter (Signed)
Please advise. Thanks.  

## 2015-12-12 NOTE — Telephone Encounter (Signed)
Patient wants to know if she has to keep getting annual mammograms or not. She does not want to go every year. Her last one was a year ago at [facility]. Please advise.

## 2015-12-13 NOTE — Telephone Encounter (Signed)
She may completely stop getting mammograms now.

## 2015-12-13 NOTE — Telephone Encounter (Signed)
Left message for pt to call back  °

## 2015-12-17 NOTE — Telephone Encounter (Signed)
Pt advised and voiced understanding.   

## 2016-04-01 ENCOUNTER — Telehealth: Payer: Self-pay | Admitting: Family Medicine

## 2016-04-01 NOTE — Telephone Encounter (Signed)
Needs appt--preferably 30 min.-thx

## 2016-04-01 NOTE — Telephone Encounter (Signed)
Patient states she is having a lot of difficult since husbands death.  Patient states she doesn't know what to do, whether she can see a therapist or medication?  Appointment?  Patient states she is worried medication will cause her to not be able to focus on different things she needs to do though.  Can you please advise?

## 2016-04-02 NOTE — Telephone Encounter (Signed)
Patient aware. I scheduled her tomorrow at 10:30.

## 2016-04-03 ENCOUNTER — Ambulatory Visit (INDEPENDENT_AMBULATORY_CARE_PROVIDER_SITE_OTHER): Payer: Medicare Other | Admitting: Family Medicine

## 2016-04-03 ENCOUNTER — Encounter: Payer: Self-pay | Admitting: Family Medicine

## 2016-04-03 VITALS — BP 154/77 | HR 64 | Temp 98.0°F | Resp 16 | Ht 60.0 in | Wt 154.5 lb

## 2016-04-03 DIAGNOSIS — F4321 Adjustment disorder with depressed mood: Secondary | ICD-10-CM | POA: Diagnosis not present

## 2016-04-03 DIAGNOSIS — Z634 Disappearance and death of family member: Principal | ICD-10-CM

## 2016-04-03 DIAGNOSIS — F4329 Adjustment disorder with other symptoms: Secondary | ICD-10-CM

## 2016-04-03 MED ORDER — ESCITALOPRAM OXALATE 5 MG PO TABS: 5.0000 mg | ORAL_TABLET | Freq: Every day | ORAL | Status: DC

## 2016-04-03 NOTE — Progress Notes (Signed)
Pre visit review using our clinic review tool, if applicable. No additional management support is needed unless otherwise documented below in the visit note. 

## 2016-04-03 NOTE — Progress Notes (Signed)
OFFICE VISIT  04/03/2016   CC:  Chief Complaint  Patient presents with  . discuss husbands death   HPI:    Patient is a 82 y.o. Caucasian female who presents for problems with grief. Her husband died a couple months ago.  Feels very sad, depressed.  Crying spells frequently. Sleep is starting to be interrupted some and carrying on with her daily activities is getting "heavier". She is eating.  No SI or HI.  Still going to church. She continues to take her medications.  She is in contact with son and 2 sisters but she doesn't really have conversations much about what she is dealing with regarding her husband's death. She is open to medication treatment and possibly counseling. She has been on a benzo prn in the past. She was on an antidepressant in the past about 7-9 yrs ago when she was forced to relocated her home due to Engineer, maintenance (IT).  She can't recall the name of it.   Past Medical History  Diagnosis Date  . DEPRESSION, SITUATIONAL 03/26/2009    Qualifier: Diagnosis of  By: Linda Hedges MD, [place] 06/07/2007    Qualifier: Diagnosis of  By: Danny Lawless CMA, [place]    . HYPERLIPIDEMIA 06/07/2007    Qualifier: Diagnosis of  By: Danny Lawless CMA, [place]    . HYPERTENSION 06/07/2007    Qualifier: Diagnosis of  By: Danny Lawless CMA, [place]    . HYSTERECTOMY, HX OF 06/07/2007    Qualifier: Diagnosis of  By: Danny Lawless CMA, [place]    . Personal history of surgery to other organs 06/07/2007    Centricity Description: BLADDER REPAIR, HX OF Qualifier: Diagnosis of  By: [place], [place]   Centricity Description: LUMPECTOMY, BREAST, HX OF Qualifier: Diagnosis of  By: [place], [place]    . POSTMENOPAUSAL OSTEOPOROSIS 11/18/2010    Qualifier: Diagnosis of  By: Linda Hedges MD, [place] Knuckles   . CARCINOMA, BREAST, LEFT 1993    Lumpectomy, adjuvant XRT, then tamoxifen.  Gets annual mammograms.  . Baker's cyst of knee 02/25/2012    LE Venous Doppler May 30, '13 - non-vascular swelling/mass left popliteal fossa  c/w Baker's cyst     Past Surgical History  Procedure Laterality Date  . Breast surgery  1993    lumpectomy w/adjuvant XRT  . Bladder repair    . Abdominal hysterectomy      Benign reasons per pt, but she can't recall exactly what dx.  Ovaries and tubes are still in.    . Eye surgery  2011    cataract surgery [facility]     Outpatient Prescriptions Prior to Visit  Medication Sig Dispense Refill  . amLODipine (NORVASC) 10 MG tablet Take 1 tablet (10 mg total) by mouth daily. 90 tablet 3  . BIOTIN PO Take 1 tablet by mouth daily.    . Calcium Carbonate-Vitamin D (CALCIUM + D PO) Take 1 tablet by mouth every morning.    . Cholecalciferol 1000 UNITS TBDP Take 1,000 Units by mouth every morning.    . dorzolamide-timolol (COSOPT) 22.3-6.8 MG/ML ophthalmic solution   12  . hydrochlorothiazide (HYDRODIURIL) 25 MG tablet Take 1 tablet (25 mg total) by mouth daily. 90 tablet 3  . latanoprost (XALATAN) 0.005 % ophthalmic solution Place 1 drop into both eyes at bedtime.    Marland Kitchen losartan (COZAAR) 100 MG tablet Take 1 tablet (100 mg total) by mouth daily. 90 tablet 3  . metoprolol succinate (TOPROL-XL) 25 MG 24 hr tablet Take 1 tablet (25  mg total) by mouth every morning. 90 tablet 3  . Multiple Vitamins-Minerals (ICAPS AREDS FORMULA PO) Take 2 capsules by mouth 2 (two) times daily.     No facility-administered medications prior to visit.    Allergies  Allergen Reactions  . Atorvastatin Other (See Comments)    Leg myalgias    ROS As per HPI  PE: Blood pressure 154/77, pulse 64, temperature 98 F (36.7 C), temperature source Oral, resp. rate 16, height 5' (1.524 m), weight 154 lb 8 oz (70.081 kg), SpO2 92 %. Gen: Alert, well appearing.  Patient is oriented to person, place, time, and situation. AFFECT: pleasant, at times tearful but well composed, lucid thought and speech. No further exam.   LABS:  Lab Results  Component Value Date   TSH 2.99 04/12/2008   Lab Results  Component Value  Date   WBC 10.6* 01/10/2011   HGB 13.2 01/10/2011   HCT 40.2 01/10/2011   MCV 94.4 01/10/2011   PLT 274 01/10/2011   Lab Results  Component Value Date   CREATININE 0.74 04/25/2015   BUN 13 04/25/2015   NA 143 04/25/2015   K 4.7 04/25/2015   CL 106 04/25/2015   CO2 30 04/25/2015   Lab Results  Component Value Date   ALT 12 01/17/2014   AST 19 01/17/2014   ALKPHOS 78 01/17/2014   BILITOT 0.7 01/17/2014   Lab Results  Component Value Date   CHOL 176 01/17/2014   Lab Results  Component Value Date   HDL 57.60 01/17/2014   Lab Results  Component Value Date   LDLCALC 91 01/17/2014   Lab Results  Component Value Date   TRIG 139.0 01/17/2014   Lab Results  Component Value Date   CHOLHDL 3 01/17/2014    IMPRESSION AND PLAN:  Complicated grief reaction. Pt with history of situational depression in the past. She is open to med trial: started ecitalopram 5mg  qhs.  Therapeutic expectations and side effect profile of medication discussed today.  Patient's questions answered. She declined counseling at this time b/c she feels things are difficult enough for her right now as it is (driving lots of places, taking care of paperwork, etc).  She knows this is an option if she changes her mind.  I handed her a [facility] grief counseling brochure to read at home. Gave emotional support today.  Spent 30 min with pt today, with >50% of this time spent in counseling and care coordination regarding the above problems.  An After Visit Summary was printed and given to the patient.  FOLLOW UP: Return for 3-4 weeks f/u grief/depression (30 min).  Signed:  Crissie Sickles, MD           04/03/2016

## 2016-04-07 ENCOUNTER — Ambulatory Visit (INDEPENDENT_AMBULATORY_CARE_PROVIDER_SITE_OTHER): Payer: Medicare Other | Admitting: Family Medicine

## 2016-04-07 ENCOUNTER — Encounter: Payer: Self-pay | Admitting: Family Medicine

## 2016-04-07 VITALS — BP 120/70 | HR 75 | Temp 98.3°F | Resp 16 | Ht 60.0 in | Wt 154.0 lb

## 2016-04-07 DIAGNOSIS — F4321 Adjustment disorder with depressed mood: Secondary | ICD-10-CM

## 2016-04-07 DIAGNOSIS — F411 Generalized anxiety disorder: Secondary | ICD-10-CM | POA: Diagnosis not present

## 2016-04-07 MED ORDER — ALPRAZOLAM 0.25 MG PO TABS: ORAL_TABLET | ORAL | Status: DC

## 2016-04-07 NOTE — Progress Notes (Signed)
Pre visit review using our clinic review tool, if applicable. No additional management support is needed unless otherwise documented below in the visit note. 

## 2016-04-07 NOTE — Progress Notes (Signed)
OFFICE VISIT  04/07/2016   CC:  Chief Complaint  Patient presents with  . Grief Reaction   HPI:    Patient is a 82 y.o. Caucasian female who presents for ongoing/severe grief reaction to the death of her husband about 2 months ago.  I last saw her 4 days ago and started her on 5mg  of lexapro qd. She was not interested in grief counseling at that time, although I gave her a brochure with some info about this last visit.  Feeling overwhelmed with periods of anxiety, esp regarding all the paperwork/signing things she has to do. Gets almost panicky at times.  Xanax in remote past has helped on prn basis in times like this. The grief is something she is handling and accepting, not denying. No adverse effects from lexapro. She still thinks counseling would be more stressful than helpful.  Past Medical History  Diagnosis Date  . DEPRESSION, SITUATIONAL 03/26/2009    Qualifier: Diagnosis of  By: Linda Hedges MD, [place] 06/07/2007    Qualifier: Diagnosis of  By: Danny Lawless CMA, [place]    . HYPERLIPIDEMIA 06/07/2007    Qualifier: Diagnosis of  By: Danny Lawless CMA, [place]    . HYPERTENSION 06/07/2007    Qualifier: Diagnosis of  By: Danny Lawless CMA, [place]    . HYSTERECTOMY, HX OF 06/07/2007    Qualifier: Diagnosis of  By: Danny Lawless CMA, [place]    . Personal history of surgery to other organs 06/07/2007    Centricity Description: BLADDER REPAIR, HX OF Qualifier: Diagnosis of  By: [place], [place]   Centricity Description: LUMPECTOMY, BREAST, HX OF Qualifier: Diagnosis of  By: [place], [place]    . POSTMENOPAUSAL OSTEOPOROSIS 11/18/2010    Qualifier: Diagnosis of  By: Linda Hedges MD, Heinz Knuckles   . CARCINOMA, BREAST, LEFT 1993    Lumpectomy, adjuvant XRT, then tamoxifen.  Gets annual mammograms.  . Baker's cyst of knee 02/25/2012    LE Venous Doppler May 30, '13 - non-vascular swelling/mass left popliteal fossa c/w Baker's cyst     Past Surgical History  Procedure Laterality Date  . Breast surgery   1993    lumpectomy w/adjuvant XRT  . Bladder repair    . Abdominal hysterectomy      Benign reasons per pt, but she can't recall exactly what dx.  Ovaries and tubes are still in.    . Eye surgery  2011    cataract surgery [facility]     Outpatient Prescriptions Prior to Visit  Medication Sig Dispense Refill  . amLODipine (NORVASC) 10 MG tablet Take 1 tablet (10 mg total) by mouth daily. 90 tablet 3  . BIOTIN PO Take 1 tablet by mouth daily.    . Calcium Carbonate-Vitamin D (CALCIUM + D PO) Take 1 tablet by mouth every morning.    . Cholecalciferol 1000 UNITS TBDP Take 1,000 Units by mouth every morning.    . dorzolamide-timolol (COSOPT) 22.3-6.8 MG/ML ophthalmic solution   12  . escitalopram (LEXAPRO) 5 MG tablet Take 1 tablet (5 mg total) by mouth daily. 30 tablet 1  . hydrochlorothiazide (HYDRODIURIL) 25 MG tablet Take 1 tablet (25 mg total) by mouth daily. 90 tablet 3  . latanoprost (XALATAN) 0.005 % ophthalmic solution Place 1 drop into both eyes at bedtime.    Marland Kitchen losartan (COZAAR) 100 MG tablet Take 1 tablet (100 mg total) by mouth daily. 90 tablet 3  . metoprolol succinate (TOPROL-XL) 25 MG 24 hr tablet Take 1 tablet (25 mg total)  by mouth every morning. 90 tablet 3  . Multiple Vitamins-Minerals (ICAPS AREDS FORMULA PO) Take 2 capsules by mouth 2 (two) times daily.     No facility-administered medications prior to visit.    Allergies  Allergen Reactions  . Atorvastatin Other (See Comments)    Leg myalgias    ROS As per HPI  PE: Blood pressure 120/70, pulse 75, temperature 98.3 F (36.8 C), temperature source Oral, resp. rate 16, height 5' (1.524 m), weight 154 lb (69.854 kg), SpO2 91 %. Gen: Alert, well appearing.  Patient is oriented to person, place, time, and situation. AFFECT: pleasant, lucid thought and speech.  LABS:  none  IMPRESSION AND PLAN:  Grief response, consisting of depressed mood and high anxiety state. She almost has panicky periods.  She has done well  on low dose benzo in the past, so I will start that again today: alprazolam 0.25mg , 1 tab po bid prn severe anxiety.  Therapeutic expectations and side effect profile of medication discussed today.  Patient's questions answered. She'll continue the lexapro 5mg  qd that we just started 4 d/a. She continues to consider counseling but has a good support network at home, and maintains the belief that the stress of driving to an appointment with a new person in [place] would be too much added stress at this time.  An After Visit Summary was printed and given to the patient.  FOLLOW UP: Return in about 10 days (around 04/17/2016) for f/u grief/meds.  Signed:  Crissie Sickles, MD           04/07/2016

## 2016-04-14 ENCOUNTER — Telehealth: Payer: Self-pay | Admitting: *Deleted

## 2016-04-14 NOTE — Telephone Encounter (Signed)
[name] on 04/14/16 at 10:23am requesting a call back in regards to a new medication she was prescribed.   Tried calling NA, and unable to leave a message.

## 2016-04-14 NOTE — Telephone Encounter (Signed)
Spoke to pt she wanted to know if the alprazolam would interfere with her other medications. Per Dr. Anitra Lauth this medication will not interfere with her other medications. Pt advised and voiced understanding.

## 2016-04-17 ENCOUNTER — Ambulatory Visit: Payer: PRIVATE HEALTH INSURANCE | Admitting: Family Medicine

## 2016-04-20 ENCOUNTER — Ambulatory Visit (INDEPENDENT_AMBULATORY_CARE_PROVIDER_SITE_OTHER): Payer: Medicare Other | Admitting: Family Medicine

## 2016-04-20 ENCOUNTER — Encounter: Payer: Self-pay | Admitting: Family Medicine

## 2016-04-20 VITALS — BP 146/69 | HR 59 | Temp 98.3°F | Resp 16 | Ht 60.0 in | Wt 153.0 lb

## 2016-04-20 DIAGNOSIS — F411 Generalized anxiety disorder: Secondary | ICD-10-CM

## 2016-04-20 DIAGNOSIS — F4320 Adjustment disorder, unspecified: Secondary | ICD-10-CM

## 2016-04-20 MED ORDER — ALPRAZOLAM 0.25 MG PO TABS: ORAL_TABLET | ORAL | 2 refills | Status: DC

## 2016-04-20 NOTE — Progress Notes (Signed)
OFFICE VISIT  04/20/2016   CC:  Chief Complaint  Patient presents with  . Follow-up    Grief and Mood   HPI:    Patient is a 82 y.o. Caucasian female who presents for f/u grief response--her husband died about 3 mo ago.   Has had high anxiety and depressed mood. So overwhelmed at times that she becomes almost panicky. I started her on lexapro 5mg  qd a little over 2 weeks ago.  She has a good social support network and has repeatedly declined grief counseling. Last visit I added low dose alprazolam to take bid prn severe anxiety (0.25mg ).  Says she is improved since last visit but "I'm not well still".  She realizes she will likely grieve the rest of her life for her husband.  She has been reading some literature about grief.  She talks some about her loss today.  She seems to feel some improvement when she takes her alprazolam.  Minimal sedation side effect.  Still overwhelmed with responsibility of taking over some of her husband's responsibilities. Has a lawyer helping her, fortunately.    Past Medical History:  Diagnosis Date  . Baker's cyst of knee 02/25/2012   LE Venous Doppler May 30, '13 - non-vascular swelling/mass left popliteal fossa c/w Baker's cyst   . CARCINOMA, BREAST, LEFT 1993   Lumpectomy, adjuvant XRT, then tamoxifen.  Gets annual mammograms.  . DEPRESSION, SITUATIONAL 03/26/2009   Qualifier: Diagnosis of  By: Linda Hedges MD, [place] 06/07/2007   Qualifier: Diagnosis of  By: Danny Lawless CMA, [place]    . HYPERLIPIDEMIA 06/07/2007   Qualifier: Diagnosis of  By: Danny Lawless CMA, [place]    . HYPERTENSION 06/07/2007   Qualifier: Diagnosis of  By: Danny Lawless CMA, [place]    . HYSTERECTOMY, HX OF 06/07/2007   Qualifier: Diagnosis of  By: Danny Lawless CMA, [place]    . Personal history of surgery to other organs 06/07/2007   Centricity Description: BLADDER REPAIR, HX OF Qualifier: Diagnosis of  By: [place], [place]   Centricity Description: LUMPECTOMY, BREAST, HX OF Qualifier:  Diagnosis of  By: [place], [place]    . POSTMENOPAUSAL OSTEOPOROSIS 11/18/2010   Qualifier: Diagnosis of  By: Linda Hedges MD, [place] Knuckles     Past Surgical History:  Procedure Laterality Date  . ABDOMINAL HYSTERECTOMY     Benign reasons per pt, but she can't recall exactly what dx.  Ovaries and tubes are still in.    Marland Kitchen BLADDER REPAIR    . BREAST SURGERY  1993   lumpectomy w/adjuvant XRT  . EYE SURGERY  2011   cataract surgery [facility]     Outpatient Medications Prior to Visit  Medication Sig Dispense Refill  . amLODipine (NORVASC) 10 MG tablet Take 1 tablet (10 mg total) by mouth daily. 90 tablet 3  . BIOTIN PO Take 1 tablet by mouth daily.    . Calcium Carbonate-Vitamin D (CALCIUM + D PO) Take 1 tablet by mouth every morning.    . Cholecalciferol 1000 UNITS TBDP Take 1,000 Units by mouth every morning.    . dorzolamide-timolol (COSOPT) 22.3-6.8 MG/ML ophthalmic solution   12  . escitalopram (LEXAPRO) 5 MG tablet Take 1 tablet (5 mg total) by mouth daily. 30 tablet 1  . hydrochlorothiazide (HYDRODIURIL) 25 MG tablet Take 1 tablet (25 mg total) by mouth daily. 90 tablet 3  . latanoprost (XALATAN) 0.005 % ophthalmic solution Place 1 drop into both eyes at bedtime.    Marland Kitchen losartan (COZAAR) 100  MG tablet Take 1 tablet (100 mg total) by mouth daily. 90 tablet 3  . metoprolol succinate (TOPROL-XL) 25 MG 24 hr tablet Take 1 tablet (25 mg total) by mouth every morning. 90 tablet 3  . Multiple Vitamins-Minerals (ICAPS AREDS FORMULA PO) Take 2 capsules by mouth 2 (two) times daily.    Marland Kitchen ALPRAZolam (XANAX) 0.25 MG tablet 1 tab po bid prn severe anxiety 30 tablet 0   No facility-administered medications prior to visit.     Allergies  Allergen Reactions  . Atorvastatin Other (See Comments)    Leg myalgias    ROS As per HPI  PE: Blood pressure (!) 146/69, pulse (!) 59, temperature 98.3 F (36.8 C), temperature source Oral, resp. rate 16, height 5' (1.524 m), weight 153 lb (69.4 kg), SpO2 93  %. Gen: Alert, well appearing.  Patient is oriented to person, place, time, and situation. AFFECT: pleasant, lucid thought and speech. No further exam today.   LABS:  none  IMPRESSION AND PLAN:  Grief response, with exaggerated anxiety/depression. She is improved some since last visit. The current medical regimen is effective;  continue present plan and medications. I'll see her back for recheck in 1 month.  An After Visit Summary was printed and given to the patient.  FOLLOW UP: Return in about 4 weeks (around 05/18/2016).  Signed:  Crissie Sickles, MD           04/20/2016

## 2016-04-20 NOTE — Progress Notes (Signed)
Pre visit review using our clinic review tool, if applicable. No additional management support is needed unless otherwise documented below in the visit note. 

## 2016-04-27 ENCOUNTER — Ambulatory Visit: Payer: Medicare Other | Admitting: Family Medicine

## 2016-05-20 ENCOUNTER — Ambulatory Visit (INDEPENDENT_AMBULATORY_CARE_PROVIDER_SITE_OTHER): Payer: Medicare Other | Admitting: Family Medicine

## 2016-05-20 ENCOUNTER — Encounter: Payer: Self-pay | Admitting: Family Medicine

## 2016-05-20 VITALS — BP 165/67 | HR 66 | Temp 98.5°F | Resp 16 | Ht 60.0 in | Wt 151.8 lb

## 2016-05-20 DIAGNOSIS — I10 Essential (primary) hypertension: Secondary | ICD-10-CM | POA: Diagnosis not present

## 2016-05-20 DIAGNOSIS — F4321 Adjustment disorder with depressed mood: Secondary | ICD-10-CM | POA: Diagnosis not present

## 2016-05-20 LAB — BASIC METABOLIC PANEL
BUN: 18 mg/dL (ref 7–25)
CALCIUM: 9.6 mg/dL (ref 8.6–10.4)
CO2: 24 mmol/L (ref 20–31)
Chloride: 107 mmol/L (ref 98–110)
Creat: 0.77 mg/dL (ref 0.60–0.88)
Glucose, Bld: 87 mg/dL (ref 65–99)
Potassium: 4.5 mmol/L (ref 3.5–5.3)
SODIUM: 141 mmol/L (ref 135–146)

## 2016-05-20 NOTE — Progress Notes (Signed)
Pre visit review using our clinic review tool, if applicable. No additional management support is needed unless otherwise documented below in the visit note. 

## 2016-05-20 NOTE — Progress Notes (Signed)
OFFICE VISIT  05/20/2016   CC:  Chief Complaint  Patient presents with  . Follow-up    Grief   HPI:    Patient is a 82 y.o. Caucasian female who presents for 1 month f/u grief reaction--husband died about 4 mo ago. Has had high anxiety and depressed mood.  I rx'd lexapro 5mg  qd about 5-6 weeks ago.  Has also been on xanax 0.25mg  bid prn.  Says today that she is "well aquainted with grief".   Still has support network of friends and family. Sounds like she has not been taking her antidepressant.  In fact, it sounds like she never picked this medication up when I first rx'd it.  She was under a lot more emotional duress at that time and her mind could not focus well.   Says she is doing ok and doesn't think she needs this at this time.  She has been taking the xanax every morning.  She feels the anxiety subsiding somewhat.  She realizes her grief is gradually getting less but she feels like it may never go away.  She recalls that it took 2 years of grief to get over the death of her son. She denies SI or HI.  She is eating/drinking fine.  She reports compliance with all meds except the lexapro.  Past Medical History:  Diagnosis Date  . Baker's cyst of knee 02/25/2012   LE Venous Doppler May 30, '13 - non-vascular swelling/mass left popliteal fossa c/w Baker's cyst   . CARCINOMA, BREAST, LEFT 1993   Lumpectomy, adjuvant XRT, then tamoxifen.  Gets annual mammograms.  . DEPRESSION, SITUATIONAL 03/26/2009   Qualifier: Diagnosis of  By: Linda Hedges MD, [place] 06/07/2007   Qualifier: Diagnosis of  By: Danny Lawless CMA, [place]    . HYPERLIPIDEMIA 06/07/2007   Qualifier: Diagnosis of  By: Danny Lawless CMA, [place]    . HYPERTENSION 06/07/2007   Qualifier: Diagnosis of  By: Danny Lawless CMA, [place]    . HYSTERECTOMY, HX OF 06/07/2007   Qualifier: Diagnosis of  By: Danny Lawless CMA, [place]    . Personal history of surgery to other organs 06/07/2007   Centricity Description: BLADDER REPAIR, HX OF Qualifier:  Diagnosis of  By: [place], [place]   Centricity Description: LUMPECTOMY, BREAST, HX OF Qualifier: Diagnosis of  By: [place], [place]    . POSTMENOPAUSAL OSTEOPOROSIS 11/18/2010   Qualifier: Diagnosis of  By: Linda Hedges MD, [place] Knuckles     Past Surgical History:  Procedure Laterality Date  . ABDOMINAL HYSTERECTOMY     Benign reasons per pt, but she can't recall exactly what dx.  Ovaries and tubes are still in.    Marland Kitchen BLADDER REPAIR    . BREAST SURGERY  1993   lumpectomy w/adjuvant XRT  . EYE SURGERY  2011   cataract surgery [facility]     Outpatient Medications Prior to Visit  Medication Sig Dispense Refill  . ALPRAZolam (XANAX) 0.25 MG tablet 1 tab po bid prn severe anxiety 60 tablet 2  . amLODipine (NORVASC) 10 MG tablet Take 1 tablet (10 mg total) by mouth daily. 90 tablet 3  . BIOTIN PO Take 1 tablet by mouth daily.    . Calcium Carbonate-Vitamin D (CALCIUM + D PO) Take 1 tablet by mouth every morning.    . Cholecalciferol 1000 UNITS TBDP Take 1,000 Units by mouth every morning.    . dorzolamide-timolol (COSOPT) 22.3-6.8 MG/ML ophthalmic solution   12  . hydrochlorothiazide (HYDRODIURIL) 25 MG tablet Take  1 tablet (25 mg total) by mouth daily. 90 tablet 3  . latanoprost (XALATAN) 0.005 % ophthalmic solution Place 1 drop into both eyes at bedtime.    Marland Kitchen losartan (COZAAR) 100 MG tablet Take 1 tablet (100 mg total) by mouth daily. 90 tablet 3  . metoprolol succinate (TOPROL-XL) 25 MG 24 hr tablet Take 1 tablet (25 mg total) by mouth every morning. 90 tablet 3  . Multiple Vitamins-Minerals (ICAPS AREDS FORMULA PO) Take 2 capsules by mouth 2 (two) times daily.    Marland Kitchen escitalopram (LEXAPRO) 5 MG tablet Take 1 tablet (5 mg total) by mouth daily. 30 tablet 1   No facility-administered medications prior to visit.     Allergies  Allergen Reactions  . Atorvastatin Other (See Comments)    Leg myalgias    ROS As per HPI  PE: Blood pressure (!) 165/67, pulse 66, temperature 98.5 F (36.9 C),  temperature source Oral, resp. rate 16, height 5' (1.524 m), weight 151 lb 12 oz (68.8 kg), SpO2 95 %. Gen: Alert, well appearing.  Patient is oriented to person, place, time, and situation. CV: RRR, no m/r/g.   LUNGS: CTA bilat, nonlabored resps, good aeration in all lung fields. EXT: no clubbing, cyanosis, or edema.   LABS:    Chemistry      Component Value Date/Time   NA 143 04/25/2015 0929   K 4.7 04/25/2015 0929   CL 106 04/25/2015 0929   CO2 30 04/25/2015 0929   BUN 13 04/25/2015 0929   CREATININE 0.74 04/25/2015 0929      Component Value Date/Time   CALCIUM 9.7 04/25/2015 0929   ALKPHOS 78 01/17/2014 1129   AST 19 01/17/2014 1129   ALT 12 01/17/2014 1129   BILITOT 0.7 01/17/2014 1129      IMPRESSION AND PLAN:  1) Grief reaction, improving. She is not taking the lexapro so I'll take this off her med list. Continue xanax 0.25mg  bid prn.  2) HTN: compliant with meds.  123456 systolic is reasonable goal for her age. Check lytes/cr today.  An After Visit Summary was printed and given to the patient.  FOLLOW UP: Return in about 2 months (around 07/20/2016) for f/u grief reaction.  Signed:  Crissie Sickles, MD           05/20/2016

## 2016-05-20 NOTE — Addendum Note (Signed)
Addended by: Onalee Hua on: 05/20/2016 12:44 PM   Modules accepted: Orders

## 2016-06-08 DIAGNOSIS — H43813 Vitreous degeneration, bilateral: Secondary | ICD-10-CM | POA: Diagnosis not present

## 2016-06-08 DIAGNOSIS — H353132 Nonexudative age-related macular degeneration, bilateral, intermediate dry stage: Secondary | ICD-10-CM | POA: Diagnosis not present

## 2016-06-08 DIAGNOSIS — H401113 Primary open-angle glaucoma, right eye, severe stage: Secondary | ICD-10-CM | POA: Diagnosis not present

## 2016-06-08 DIAGNOSIS — H401122 Primary open-angle glaucoma, left eye, moderate stage: Secondary | ICD-10-CM | POA: Diagnosis not present

## 2016-06-12 ENCOUNTER — Telehealth: Payer: Self-pay | Admitting: *Deleted

## 2016-06-12 NOTE — Telephone Encounter (Signed)
spoke with patient scheduled apt for evaluation.

## 2016-06-12 NOTE — Telephone Encounter (Signed)
Go ahead and set her up for an appt to discuss meds.-thx

## 2016-06-12 NOTE — Telephone Encounter (Signed)
Patient called and states she is having a lot of anxiety due to grieving. She states currently her xanax is not working as a prn she feels like she needs something taken daily on a scheduled routine. She states she has made an appt for grief counseling for 06/18/16. Patient is willing to make an appt to see you if needed to evaluate for further treatment. Please advise.

## 2016-06-17 ENCOUNTER — Ambulatory Visit (INDEPENDENT_AMBULATORY_CARE_PROVIDER_SITE_OTHER): Payer: Medicare Other | Admitting: Family Medicine

## 2016-06-17 ENCOUNTER — Encounter: Payer: Self-pay | Admitting: Family Medicine

## 2016-06-17 VITALS — BP 138/69 | HR 70 | Temp 98.1°F | Resp 18 | Wt 155.0 lb

## 2016-06-17 DIAGNOSIS — F418 Other specified anxiety disorders: Secondary | ICD-10-CM

## 2016-06-17 DIAGNOSIS — F4329 Adjustment disorder with other symptoms: Secondary | ICD-10-CM

## 2016-06-17 DIAGNOSIS — F4321 Adjustment disorder with depressed mood: Secondary | ICD-10-CM | POA: Diagnosis not present

## 2016-06-17 DIAGNOSIS — Z634 Disappearance and death of family member: Principal | ICD-10-CM

## 2016-06-17 DIAGNOSIS — F419 Anxiety disorder, unspecified: Secondary | ICD-10-CM

## 2016-06-17 DIAGNOSIS — F329 Major depressive disorder, single episode, unspecified: Secondary | ICD-10-CM

## 2016-06-17 MED ORDER — ESCITALOPRAM OXALATE 5 MG PO TABS: 5.0000 mg | ORAL_TABLET | Freq: Every day | ORAL | 1 refills | Status: DC

## 2016-06-17 NOTE — Progress Notes (Signed)
Pre visit review using our clinic review tool, if applicable. No additional management support is needed unless otherwise documented below in the visit note. 

## 2016-06-17 NOTE — Progress Notes (Signed)
OFFICE VISIT  06/17/2016   CC:  Chief Complaint  Patient presents with  . Follow-up    anxiety     HPI:    Patient is a 82 y.o. Caucasian female who presents for 20mo f/u for ongoing anxiety and depression associated with grief response due to husband's death about 1/2 year ago. Feels down/sad, gets anxious a lot.  No SI or HI.  Says she is sleeping and eating well. Has a bout of severe anxiety daily, usually around evening meal.  Starts grief counseling course soon. She takes xanax 0.25 bid.   Past Medical History:  Diagnosis Date  . Baker's cyst of knee 02/25/2012   LE Venous Doppler May 30, '13 - non-vascular swelling/mass left popliteal fossa c/w Baker's cyst   . CARCINOMA, BREAST, LEFT 1993   Lumpectomy, adjuvant XRT, then tamoxifen.  Gets annual mammograms.  . DEPRESSION, SITUATIONAL 03/26/2009   Qualifier: Diagnosis of  By: Linda Hedges MD, [place] 06/07/2007   Qualifier: Diagnosis of  By: Danny Lawless CMA, [place]    . HYPERLIPIDEMIA 06/07/2007   Qualifier: Diagnosis of  By: Danny Lawless CMA, [place]    . HYPERTENSION 06/07/2007   Qualifier: Diagnosis of  By: Danny Lawless CMA, [place]    . HYSTERECTOMY, HX OF 06/07/2007   Qualifier: Diagnosis of  By: Danny Lawless CMA, [place]    . Personal history of surgery to other organs 06/07/2007   Centricity Description: BLADDER REPAIR, HX OF Qualifier: Diagnosis of  By: [place], [place]   Centricity Description: LUMPECTOMY, BREAST, HX OF Qualifier: Diagnosis of  By: [place], [place]    . POSTMENOPAUSAL OSTEOPOROSIS 11/18/2010   Qualifier: Diagnosis of  By: Linda Hedges MD, [place] Knuckles     Past Surgical History:  Procedure Laterality Date  . ABDOMINAL HYSTERECTOMY     Benign reasons per pt, but she can't recall exactly what dx.  Ovaries and tubes are still in.    Marland Kitchen BLADDER REPAIR    . BREAST SURGERY  1993   lumpectomy w/adjuvant XRT  . EYE SURGERY  2011   cataract surgery [facility]     Outpatient Medications Prior to Visit  Medication Sig Dispense  Refill  . ALPRAZolam (XANAX) 0.25 MG tablet 1 tab po bid prn severe anxiety 60 tablet 2  . amLODipine (NORVASC) 10 MG tablet Take 1 tablet (10 mg total) by mouth daily. 90 tablet 3  . BIOTIN PO Take 1 tablet by mouth daily.    . Calcium Carbonate-Vitamin D (CALCIUM + D PO) Take 1 tablet by mouth every morning.    . Cholecalciferol 1000 UNITS TBDP Take 1,000 Units by mouth every morning.    . dorzolamide-timolol (COSOPT) 22.3-6.8 MG/ML ophthalmic solution   12  . hydrochlorothiazide (HYDRODIURIL) 25 MG tablet Take 1 tablet (25 mg total) by mouth daily. 90 tablet 3  . latanoprost (XALATAN) 0.005 % ophthalmic solution Place 1 drop into both eyes at bedtime.    Marland Kitchen losartan (COZAAR) 100 MG tablet Take 1 tablet (100 mg total) by mouth daily. 90 tablet 3  . metoprolol succinate (TOPROL-XL) 25 MG 24 hr tablet Take 1 tablet (25 mg total) by mouth every morning. 90 tablet 3  . Multiple Vitamins-Minerals (ICAPS AREDS FORMULA PO) Take 2 capsules by mouth 2 (two) times daily.     No facility-administered medications prior to visit.     Allergies  Allergen Reactions  . Atorvastatin Other (See Comments)    Leg myalgias    ROS As per HPI  PE:  Blood pressure 138/69, pulse 70, temperature 98.1 F (36.7 C), temperature source Oral, resp. rate 18, weight 155 lb (70.3 kg), SpO2 94 %. Gen: Alert, well appearing.  Patient is oriented to person, place, time, and situation. AFFECT: pleasant, lucid thought and speech. CV: RRR, no m/r/g.   LUNGS: CTA bilat, nonlabored resps, good aeration in all lung fields. EXT: no clubbing, cyanosis, or edema.    LABS:  none  IMPRESSION AND PLAN:  Complicated grief reaction; significant anxiety and mild depression. Start lexapro 5mg  qd like I tried to do a couple months back. Continue xanax 0.25mg  bid. Start grief counseling. Has good social support network.  An After Visit Summary was printed and given to the patient.  FOLLOW UP: Return in about 4 weeks  (around 07/15/2016) for f/u anx/dep/grief.  Signed:  Crissie Sickles, MD           06/17/2016

## 2016-06-26 ENCOUNTER — Encounter: Payer: Self-pay | Admitting: Family Medicine

## 2016-06-26 ENCOUNTER — Ambulatory Visit (INDEPENDENT_AMBULATORY_CARE_PROVIDER_SITE_OTHER): Payer: Medicare Other | Admitting: Family Medicine

## 2016-06-26 VITALS — BP 153/62 | HR 65 | Temp 98.1°F | Resp 18 | Wt 157.4 lb

## 2016-06-26 DIAGNOSIS — F4320 Adjustment disorder, unspecified: Secondary | ICD-10-CM

## 2016-06-26 DIAGNOSIS — M5432 Sciatica, left side: Secondary | ICD-10-CM | POA: Diagnosis not present

## 2016-06-26 NOTE — Progress Notes (Signed)
Pre visit review using our clinic review tool, if applicable. No additional management support is needed unless otherwise documented below in the visit note. 

## 2016-06-26 NOTE — Progress Notes (Signed)
OFFICE NOTE  06/26/2016  CC:  Chief Complaint  Patient presents with  . Follow-up   HPI: Patient is a 82 y.o. Caucasian female who is here for f/u anxiety/grief reaction. I just saw her 9 d/a for this and started lexapro 5 mg qd at that time.  No side effects. Still struggling with a lot of anxiety, feeling overwhelmed. Sleeping fine, eating fine, still doing her "chores", started grief counseling class.  About the last 3 weeks has been hurting in L gluteal area and it radiates down posterolateral aspect of left leg.  No tingling or numbness.  No trauma or strain recalled.  No weakness.  No loss of bowel/bladder function.  Pertinent PMH:  Past medical, surgical, social, and family history reviewed and no changes are noted since last office visit.  MEDS:  Outpatient Medications Prior to Visit  Medication Sig Dispense Refill  . ALPHAGAN P 0.1 % SOLN     . ALPRAZolam (XANAX) 0.25 MG tablet 1 tab po bid prn severe anxiety 60 tablet 2  . amLODipine (NORVASC) 10 MG tablet Take 1 tablet (10 mg total) by mouth daily. 90 tablet 3  . BIOTIN PO Take 1 tablet by mouth daily.    . Calcium Carbonate-Vitamin D (CALCIUM + D PO) Take 1 tablet by mouth every morning.    . Cholecalciferol 1000 UNITS TBDP Take 1,000 Units by mouth every morning.    . dorzolamide-timolol (COSOPT) 22.3-6.8 MG/ML ophthalmic solution   12  . escitalopram (LEXAPRO) 5 MG tablet Take 1 tablet (5 mg total) by mouth daily. 30 tablet 1  . hydrochlorothiazide (HYDRODIURIL) 25 MG tablet Take 1 tablet (25 mg total) by mouth daily. 90 tablet 3  . latanoprost (XALATAN) 0.005 % ophthalmic solution Place 1 drop into both eyes at bedtime.    Marland Kitchen losartan (COZAAR) 100 MG tablet Take 1 tablet (100 mg total) by mouth daily. 90 tablet 3  . metoprolol succinate (TOPROL-XL) 25 MG 24 hr tablet Take 1 tablet (25 mg total) by mouth every morning. 90 tablet 3  . Multiple Vitamins-Minerals (ICAPS AREDS FORMULA PO) Take 2 capsules by mouth 2 (two)  times daily.    . timolol (TIMOPTIC) 0.5 % ophthalmic solution      No facility-administered medications prior to visit.     PE: Blood pressure (!) 153/62, pulse 65, temperature 98.1 F (36.7 C), temperature source Oral, resp. rate 18, weight 157 lb 6.4 oz (71.4 kg), SpO2 95 %. Gen: Alert, well appearing.  Patient is oriented to person, place, time, and situation. BACK: no TTP.  Glut: no TTP. Hip: no TTP, ROM fully intact w/out pain or stiffness.  Sitting SLR neg bilat. LE strength 5/5 prox/dist bilat.  IMPRESSION AND PLAN:  1) Grief response--prolonged/atypical.  Reassured pt once again that what she is going through is going to eventually get better.  Also, reiterated that the lexapro is a med that takes 3 wks to work on average and she must be patient. She'll continue all current meds at current doses and she'll continue her Grief counseling class.  2) L leg sciatica: not bothering her enough to warrant a trial of prednisone at this time. Reassured pt that this was not a hip problem.  She'll continue heat application + bengay. Prednisone 40mg  qd x 5d in future can be considered if the sx's become more severe or consistent.  Spent 25 min with pt today, with >50% of this time spent in counseling and care coordination regarding the above problems.  An After Visit Summary was printed and given to the patient.  FOLLOW UP: keep appt already set for about 3 wks from now.   Signed:  Crissie Sickles, MD           06/26/2016

## 2016-07-01 ENCOUNTER — Telehealth: Payer: Self-pay | Admitting: Family Medicine

## 2016-07-01 MED ORDER — PREDNISONE 20 MG PO TABS: ORAL_TABLET | ORAL | 0 refills | Status: DC

## 2016-07-01 NOTE — Telephone Encounter (Signed)
Patient notified and eRx sent.

## 2016-07-01 NOTE — Telephone Encounter (Signed)
Patient Name: Amy Turner  DOB: 02/11/1926    Initial Comment Caller states saw dr on the 20th regarding hip and leg, still not feeling any better   Nurse Assessment  Nurse: Mallie Mussel, RN, Alveta Heimlich Date/Time [name] Time): 07/01/2016 9:43:38 AM  Confirm and document reason for call. If symptomatic, describe symptoms. You must click the next button to save text entered. ---Caller states that she saw the doctor on the 20th for pain in her left hip and leg. She has been using heat regularly as discussed. It isn't helping. She rates her pain as 8 on 0-10 scale. She has tried OTC which did help some. She denies injury. She thinks this is the sciatic nerve bothering her.  Has the patient traveled out of the country within the last 30 days? ---No  Does the patient have any new or worsening symptoms? ---Yes  Will a triage be completed? ---Yes  Related visit to physician within the last 2 weeks? ---No  Does the PT have any chronic conditions? (i.e. diabetes, asthma, etc.) ---Yes  List chronic conditions. ---HTN, Hypercholesterolemia  Is this a behavioral health or substance abuse call? ---No     Guidelines    Guideline Title Affirmed Question Affirmed Notes  Hip Pain [1] MODERATE pain (e.g., interferes with normal activities, limping) AND [2] present > 3 days    Final Disposition User   See PCP When Office is Open (within 3 days) Mallie Mussel, Therapist, sports, Alveta Heimlich    Comments  Caller was hoping to get a referral to a physical therapy instead of being seen again, or she wants to speak with someone from the office about what she has been through to see if it may be related. I advised her that I will forward this to the office and someone will be calling her back. She verbalized understanding. The name of the physical therapy location that she would like to be seen at is [facility] [phone] phone.   Referrals  REFERRED TO PCP OFFICE   Disagree/Comply: Comply

## 2016-07-01 NOTE — Telephone Encounter (Signed)
Pls call pt and tell her we'll call in prednisone to try to help her with her sciatica. Pls eRx prednisone 20mg , 2 tabs po qd x 5d, then 1 tab po qd x 5d, #15, no RF.-thx

## 2016-07-03 ENCOUNTER — Telehealth: Payer: Self-pay

## 2016-07-03 NOTE — Telephone Encounter (Signed)
Patient called stating that Prednisone  2 a day was making her "feel crazy".   She asked if she could cut down to 1 tab a day.  She has taken 3 days bid. Instructed patient to go ahead and take one a day since she was suppose to in 2 more days and to call back Monday and let us know how she was feeling.

## 2016-07-05 NOTE — Telephone Encounter (Signed)
Agree/Noted. 

## 2016-07-06 NOTE — Telephone Encounter (Signed)
Patient Name: Amy Turner Gender: Female DOB: 08-19-1926 Age: 82 Y 14 D Return Phone Number: [phone] (Primary) Address: City/State/Zip: [place] Client [facility] Client Client Site [facility] Physician Crissie Sickles - MD Contact Type Call Who Is Calling Patient / Member / Family / Caregiver Call Type Triage / Clinical Relationship To Patient Self Return Phone Number [phone] (Primary) Chief Complaint Anxiety and Panic Attack Reason for Call Symptomatic / Request for [place] states, she is having serve anxiety attacks. Rx. reaction- has not been taking the rx. as directed - she found the bottle and was going to have a refill on it - she has been off of it. She thinks she put her bottle out and it was set in another place and she thinks she has been off of it. 1 tablet twice a day as needed. Verified. PreDisposition Did not know what to do Translation No Nurse Assessment Nurse: Joya Gaskins, RN, Vonna Kotyk Date/Time [name] Time): 07/03/2016 7:07:22 PM Confirm and document reason for call. If symptomatic, describe symptoms. You must click the next button to save text entered. ---Caller states, she is having serve anxiety attacks. Rx. reactionhas not been taking the rx. as directed - she found the bottle and was going to have a refill on it - she has been off of it. She thinks she put her bottle out and it was set in another place and she thinks she has been off of it. Alprazolam 1 tablet twice a day as needed. Has the patient traveled out of the country within the last 30 days? ---No Does the patient have any new or worsening symptoms? ---Yes Will a triage be completed? ---Yes Related visit to physician within the last 2 weeks? ---N/A Does the PT have any chronic conditions? (i.e. diabetes, asthma, etc.) ---No Is this a behavioral health or substance abuse call? ---No Guidelines Guideline  Title Affirmed Question Affirmed Notes Nurse Date/Time (Eastern Time) Anxiety and Panic Attack Patient sounds very upset or troubled to the triager Joya Gaskins, RN, Vonna Kotyk 07/03/2016 7:14:16 PM Disp. Time [name] Time) Disposition Final User PLEASE NOTE: All timestamps contained within this report are represented as [place] Standard Time. CONFIDENTIALTY NOTICE: This fax transmission is intended only for the addressee. It contains information that is legally privileged, confidential or otherwise protected from use or disclosure. If you are not the intended recipient, you are strictly prohibited from reviewing, disclosing, copying using or disseminating any of this information or taking any action in reliance on or regarding this information. If you have received this fax in error, please notify us immediately by telephone so that we can arrange for its return to [place]. Phone: [phone], Toll-Free: [phone], Fax: [phone] Page: 2 of 2 Call Id: [id number] 07/03/2016 7:27:51 PM See Physician within 24 Hours Yes Joya Gaskins, RN, Aviva Kluver Understands: Yes Disagree/Comply: Comply Care Advice Given Per Guideline SEE PHYSICIAN WITHIN 24 HOURS: * IF OFFICE WILL BE CLOSED AND NO PCP TRIAGE: You need to be seen within the next 24 hours. An urgent care center is often a good source of care if your doctor's office is closed. HEALTHY LIFESTYLE BASICS: * Sleep - Try to get sufficient amount of sleep. Most people need 7-8 hours of sleep each night. * Eat a balanced healthy diet. * Drink adequate liquids - 6-8 glasses of water daily. AVOID TRIGGERS OF ANXIETY: CALL BACK IF: * You become worse. Referrals REFERRED TO PCP OFFICE

## 2016-07-09 ENCOUNTER — Other Ambulatory Visit: Payer: Self-pay

## 2016-07-09 MED ORDER — ESCITALOPRAM OXALATE 5 MG PO TABS: 5.0000 mg | ORAL_TABLET | Freq: Every day | ORAL | 1 refills | Status: DC

## 2016-07-09 NOTE — Telephone Encounter (Signed)
Medication refilled

## 2016-07-10 ENCOUNTER — Other Ambulatory Visit: Payer: Self-pay

## 2016-07-10 MED ORDER — ESCITALOPRAM OXALATE 5 MG PO TABS: 5.0000 mg | ORAL_TABLET | Freq: Every day | ORAL | 0 refills | Status: DC

## 2016-07-10 NOTE — Telephone Encounter (Signed)
Pharmacy requesting Escitalopram 5 mg #90. Refill sent.

## 2016-07-14 DIAGNOSIS — H401122 Primary open-angle glaucoma, left eye, moderate stage: Secondary | ICD-10-CM | POA: Diagnosis not present

## 2016-07-14 DIAGNOSIS — H401113 Primary open-angle glaucoma, right eye, severe stage: Secondary | ICD-10-CM | POA: Diagnosis not present

## 2016-07-16 ENCOUNTER — Telehealth: Payer: Self-pay

## 2016-07-16 DIAGNOSIS — H5713 Ocular pain, bilateral: Secondary | ICD-10-CM | POA: Diagnosis not present

## 2016-07-16 DIAGNOSIS — T7840XA Allergy, unspecified, initial encounter: Secondary | ICD-10-CM | POA: Diagnosis not present

## 2016-07-16 NOTE — Telephone Encounter (Signed)
Received call from Dr. Odella Aquas office, stating that patient walked in off the street c/o eye irritation from pulling weeds and then rubbing eyes.  Staff reported rinsing eyes with no relief. Patient stated may have been poison ivy in with weeds.  Dr. Gabriel Carina requesting patient to be seen.  Dr. Anitra Lauth notified and directed patient to go to urgent care.  Dr. Odella Aquas office  Notified and will instruct patient.

## 2016-07-20 ENCOUNTER — Encounter: Payer: Self-pay | Admitting: Family Medicine

## 2016-07-20 ENCOUNTER — Ambulatory Visit (INDEPENDENT_AMBULATORY_CARE_PROVIDER_SITE_OTHER): Payer: Medicare Other | Admitting: Family Medicine

## 2016-07-20 VITALS — BP 153/78 | HR 68 | Temp 98.4°F | Resp 18 | Wt 151.8 lb

## 2016-07-20 DIAGNOSIS — F4329 Adjustment disorder with other symptoms: Secondary | ICD-10-CM

## 2016-07-20 DIAGNOSIS — Z634 Disappearance and death of family member: Secondary | ICD-10-CM

## 2016-07-20 DIAGNOSIS — F4321 Adjustment disorder with depressed mood: Secondary | ICD-10-CM

## 2016-07-20 NOTE — Progress Notes (Signed)
Pre visit review using our clinic review tool, if applicable. No additional management support is needed unless otherwise documented below in the visit note. 

## 2016-07-20 NOTE — Progress Notes (Signed)
OFFICE VISIT  07/20/2016   CC:  Chief Complaint  Patient presents with  . Follow-up   HPI:    Patient is a 82 y.o. Caucasian female who presents for f/u ongoing problems with anxiety. She took herself off lexapro and xanax.   She is absolutely overwhelmed with things, needs to go over her medications again. Needs constant reassurance that she is going to be ok.  She questions everything she does and is too afraid to take any meds like lexapro or xanax. She is still going to grief counseling.  Past Medical History:  Diagnosis Date  . Baker's cyst of knee 02/25/2012   LE Venous Doppler May 30, '13 - non-vascular swelling/mass left popliteal fossa c/w Baker's cyst   . CARCINOMA, BREAST, LEFT 1993   Lumpectomy, adjuvant XRT, then tamoxifen.  Gets annual mammograms.  . DEPRESSION, SITUATIONAL 03/26/2009   Qualifier: Diagnosis of  By: Linda Hedges MD, [place] 06/07/2007   Qualifier: Diagnosis of  By: Danny Lawless CMA, [place]    . HYPERLIPIDEMIA 06/07/2007   Qualifier: Diagnosis of  By: Danny Lawless CMA, [place]    . HYPERTENSION 06/07/2007   Qualifier: Diagnosis of  By: Danny Lawless CMA, [place]    . HYSTERECTOMY, HX OF 06/07/2007   Qualifier: Diagnosis of  By: Danny Lawless CMA, [place]    . Personal history of surgery to other organs 06/07/2007   Centricity Description: BLADDER REPAIR, HX OF Qualifier: Diagnosis of  By: [place], [place]   Centricity Description: LUMPECTOMY, BREAST, HX OF Qualifier: Diagnosis of  By: [place], [place]    . POSTMENOPAUSAL OSTEOPOROSIS 11/18/2010   Qualifier: Diagnosis of  By: Linda Hedges MD, Heinz Knuckles     Past Surgical History:  Procedure Laterality Date  . ABDOMINAL HYSTERECTOMY     Benign reasons per pt, but she can't recall exactly what dx.  Ovaries and tubes are still in.    Marland Kitchen BLADDER REPAIR    . BREAST SURGERY  1993   lumpectomy w/adjuvant XRT  . EYE SURGERY  2011   cataract surgery [facility]     Outpatient Medications Prior to Visit  Medication Sig Dispense  Refill  . ALPHAGAN P 0.1 % SOLN     . amLODipine (NORVASC) 10 MG tablet Take 1 tablet (10 mg total) by mouth daily. 90 tablet 3  . dorzolamide-timolol (COSOPT) 22.3-6.8 MG/ML ophthalmic solution   12  . hydrochlorothiazide (HYDRODIURIL) 25 MG tablet Take 1 tablet (25 mg total) by mouth daily. 90 tablet 3  . latanoprost (XALATAN) 0.005 % ophthalmic solution Place 1 drop into both eyes at bedtime.    Marland Kitchen losartan (COZAAR) 100 MG tablet Take 1 tablet (100 mg total) by mouth daily. 90 tablet 3  . metoprolol succinate (TOPROL-XL) 25 MG 24 hr tablet Take 1 tablet (25 mg total) by mouth every morning. 90 tablet 3  . Multiple Vitamins-Minerals (ICAPS AREDS FORMULA PO) Take 2 capsules by mouth 2 (two) times daily.    . timolol (TIMOPTIC) 0.5 % ophthalmic solution     . BIOTIN PO Take 1 tablet by mouth daily.    . Calcium Carbonate-Vitamin D (CALCIUM + D PO) Take 1 tablet by mouth every morning.    . escitalopram (LEXAPRO) 5 MG tablet Take 1 tablet (5 mg total) by mouth daily. (Patient not taking: Reported on 07/20/2016) 90 tablet 0  . ALPRAZolam (XANAX) 0.25 MG tablet 1 tab po bid prn severe anxiety (Patient not taking: Reported on 07/20/2016) 60 tablet 2  . Cholecalciferol 1000  UNITS TBDP Take 1,000 Units by mouth every morning.    . predniSONE (DELTASONE) 20 MG tablet Take 2 tabs a day x 5(five)days and then 1 tab a day x 5(five)days. (Patient not taking: Reported on 07/20/2016) 15 tablet 0   No facility-administered medications prior to visit.     Allergies  Allergen Reactions  . Atorvastatin Other (See Comments)    Leg myalgias    ROS As per HPI  PE: Blood pressure (!) 153/78, pulse 68, temperature 98.4 F (36.9 C), temperature source Temporal, resp. rate 18, weight 151 lb 12.8 oz (68.9 kg), SpO2 96 %. Gen: Alert, well appearing.  Patient is oriented to person, place, time, and situation. AFFECT: pleasant, lucid thought and speech.   LABS:  none  IMPRESSION AND  PLAN:  Prolonged/abnormal grief reaction. We tried to cut away some of her non-essential meds today in effort to keep things simpler for her. We stopped her xanax, lexapro, vit D, calcium, and biotin. She needs to continue her 4 BP meds, her ICAPS, and her eye drops. She has 4 different eye drops so I asked her to make f/u appt with her eye MD to make sure he wants her on all 4.   She is in need of a lot of encouragement and firm reinforcement that she will get better. I will see her back in 10d for recheck. She'll continue grief counseling. Given her cognitive impairment of late I want to bring up the need for neuro referral and possible neuropsych testing, but I don't think she would receive this suggestion well, nor do I know if there is anything that we can do about these things at this time.  An After Visit Summary was printed and given to the patient.  Spent 25 min with pt today, with >50% of this time spent in counseling and care coordination regarding the above problems.  FOLLOW UP: Return in about 10 days (around 07/30/2016) for f/u grief/anxiety/meds.  Signed:  Crissie Sickles, MD           07/20/2016

## 2016-07-29 ENCOUNTER — Ambulatory Visit: Payer: Medicare Other | Admitting: Family Medicine

## 2016-08-03 ENCOUNTER — Ambulatory Visit (INDEPENDENT_AMBULATORY_CARE_PROVIDER_SITE_OTHER): Payer: Medicare Other | Admitting: Family Medicine

## 2016-08-03 ENCOUNTER — Encounter: Payer: Self-pay | Admitting: Family Medicine

## 2016-08-03 VITALS — BP 153/78 | HR 73 | Temp 98.4°F | Resp 16 | Wt 152.0 lb

## 2016-08-03 DIAGNOSIS — F4321 Adjustment disorder with depressed mood: Secondary | ICD-10-CM

## 2016-08-03 DIAGNOSIS — Z634 Disappearance and death of family member: Secondary | ICD-10-CM

## 2016-08-03 DIAGNOSIS — H6123 Impacted cerumen, bilateral: Secondary | ICD-10-CM | POA: Diagnosis not present

## 2016-08-03 DIAGNOSIS — F4329 Adjustment disorder with other symptoms: Secondary | ICD-10-CM | POA: Diagnosis not present

## 2016-08-03 NOTE — Progress Notes (Signed)
Pre visit review using our clinic review tool, if applicable. No additional management support is needed unless otherwise documented below in the visit note. 

## 2016-08-03 NOTE — Progress Notes (Signed)
OFFICE VISIT  08/03/2016   CC:  Chief Complaint  Patient presents with  . Follow-up    grief    HPI:    Patient is a 82 y.o. Caucasian female who presents for 2 week f/u for complicated grief reaction with excessive anxiety and some depressed mood as well.   Says she is feeling pretty well today, not too overwhelmed by her grief/anxiety/dep lately. Loves attending grief counseling.  She is currently on no psych meds at all.  Ears feel stopped up.  Past Medical History:  Diagnosis Date  . Baker's cyst of knee 02/25/2012   LE Venous Doppler May 30, '13 - non-vascular swelling/mass left popliteal fossa c/w Baker's cyst   . CARCINOMA, BREAST, LEFT 1993   Lumpectomy, adjuvant XRT, then tamoxifen.  Gets annual mammograms.  . DEPRESSION, SITUATIONAL 03/26/2009   Qualifier: Diagnosis of  By: Linda Hedges MD, [place] 06/07/2007   Qualifier: Diagnosis of  By: Danny Lawless CMA, [place]    . HYPERLIPIDEMIA 06/07/2007   Qualifier: Diagnosis of  By: Danny Lawless CMA, [place]    . HYPERTENSION 06/07/2007   Qualifier: Diagnosis of  By: Danny Lawless CMA, [place]    . HYSTERECTOMY, HX OF 06/07/2007   Qualifier: Diagnosis of  By: Danny Lawless CMA, [place]    . Personal history of surgery to other organs 06/07/2007   Centricity Description: BLADDER REPAIR, HX OF Qualifier: Diagnosis of  By: [place], [place]   Centricity Description: LUMPECTOMY, BREAST, HX OF Qualifier: Diagnosis of  By: [place], [place]    . POSTMENOPAUSAL OSTEOPOROSIS 11/18/2010   Qualifier: Diagnosis of  By: Linda Hedges MD, Heinz Knuckles     Past Surgical History:  Procedure Laterality Date  . ABDOMINAL HYSTERECTOMY     Benign reasons per pt, but she can't recall exactly what dx.  Ovaries and tubes are still in.    Marland Kitchen BLADDER REPAIR    . BREAST SURGERY  1993   lumpectomy w/adjuvant XRT  . EYE SURGERY  2011   cataract surgery [facility]     Outpatient Medications Prior to Visit  Medication Sig Dispense Refill  . ALPHAGAN P 0.1 % SOLN     .  amLODipine (NORVASC) 10 MG tablet Take 1 tablet (10 mg total) by mouth daily. 90 tablet 3  . dorzolamide-timolol (COSOPT) 22.3-6.8 MG/ML ophthalmic solution   12  . hydrochlorothiazide (HYDRODIURIL) 25 MG tablet Take 1 tablet (25 mg total) by mouth daily. 90 tablet 3  . latanoprost (XALATAN) 0.005 % ophthalmic solution Place 1 drop into both eyes at bedtime.    Marland Kitchen losartan (COZAAR) 100 MG tablet Take 1 tablet (100 mg total) by mouth daily. 90 tablet 3  . metoprolol succinate (TOPROL-XL) 25 MG 24 hr tablet Take 1 tablet (25 mg total) by mouth every morning. 90 tablet 3  . Multiple Vitamins-Minerals (ICAPS AREDS FORMULA PO) Take 2 capsules by mouth 2 (two) times daily.    . timolol (TIMOPTIC) 0.5 % ophthalmic solution     . neomycin-polymyxin b-dexamethasone (MAXITROL) 3.5-10000-0.1 OINT Place 1 application into the left eye 4 (four) times daily.    Marland Kitchen escitalopram (LEXAPRO) 5 MG tablet Take 1 tablet (5 mg total) by mouth daily. (Patient not taking: Reported on 08/03/2016) 90 tablet 0   No facility-administered medications prior to visit.     Allergies  Allergen Reactions  . Atorvastatin Other (See Comments)    Leg myalgias    ROS As per HPI  PE: Blood pressure (!) 153/78, pulse 73, temperature  98.4 F (36.9 C), temperature source Temporal, resp. rate 16, weight 152 lb (68.9 kg), SpO2 95 %. Gen: Alert, well appearing.  Patient is oriented to person, place, time, and situation. AFFECT: pleasant, lucid thought and speech. EARS: R EAC with 80% cerumen impaction, removed by myself with curette today. Left EAC with 95% cerumen impaction: irrigated by CMA Starla Link today.  LABS:  none  IMPRESSION AND PLAN:  1) Complicated grief: excessive anxiety and depressed mood. She is improved the last 2 weeks.  Continue with grief counseling, no psych meds.  2) Cerumen impaction: removed by myself (right) and CMA (left) today.  An After Visit Summary was printed and given to the patient.  FOLLOW  UP: Return in about 4 weeks (around 08/31/2016) for f/u grief and HTN.  Signed:  Crissie Sickles, MD           08/03/2016

## 2016-08-04 ENCOUNTER — Telehealth: Payer: Self-pay

## 2016-08-04 NOTE — Telephone Encounter (Signed)
Patient called stating that she is feeling overwhelmed and very anxious about things.  Not sure what she can do. She states that this comes and goes and needs something to take edge off to take when needed.  Please advise.

## 2016-08-04 NOTE — Telephone Encounter (Signed)
Call and reassure her that she will be ok. Remind her that we've tried medication for this in the very recent past and this only made her worse/more anxious. --thx

## 2016-08-05 NOTE — Telephone Encounter (Signed)
Called patient, no answer. Will call back at later time.

## 2016-08-05 NOTE — Telephone Encounter (Signed)
Patient notified and verbalized understanding.  Patient stated she would call back if things get worse.

## 2016-08-19 ENCOUNTER — Ambulatory Visit (INDEPENDENT_AMBULATORY_CARE_PROVIDER_SITE_OTHER): Payer: Medicare Other | Admitting: Family Medicine

## 2016-08-19 ENCOUNTER — Encounter: Payer: Self-pay | Admitting: Family Medicine

## 2016-08-19 VITALS — BP 158/76 | HR 72 | Temp 98.0°F | Resp 16 | Wt 152.2 lb

## 2016-08-19 DIAGNOSIS — Z23 Encounter for immunization: Secondary | ICD-10-CM

## 2016-08-19 DIAGNOSIS — F4329 Adjustment disorder with other symptoms: Secondary | ICD-10-CM

## 2016-08-19 DIAGNOSIS — F4381 Prolonged grief disorder: Secondary | ICD-10-CM

## 2016-08-19 DIAGNOSIS — F4321 Adjustment disorder with depressed mood: Secondary | ICD-10-CM | POA: Diagnosis not present

## 2016-08-19 NOTE — Addendum Note (Signed)
Addended by: Onalee Hua on: 08/19/2016 04:06 PM   Modules accepted: Orders

## 2016-08-19 NOTE — Progress Notes (Signed)
Pre visit review using our clinic review tool, if applicable. No additional management support is needed unless otherwise documented below in the visit note. 

## 2016-08-19 NOTE — Progress Notes (Signed)
OFFICE VISIT  08/19/2016   CC:  Chief Complaint  Patient presents with  . Follow-up    [place]   HPI:    Patient is a 82 y.o. Caucasian female who presents for prolonged/complicated grief, characterized primarily by excessive anxiety and some intermittent feeling of depression.    I last saw her for this 08/03/16 and planned on seeing her again 1 mo later but she couldn't make it that long.  She basically is here seeking someone to talk to and give her reassurance today. Says she is eating and sleeping pretty well, trying to keep up doing her regular daily activities/staying busy. Feeling overwhelmed again lately: she thinks holidays are playing a role, plus it is the time of year when her oldest child passed away many years ago.   Still has intermittent L LB pain with some radiation down L glut/leg: she can tolerate this ok with bengay use.  She did not tolerate prednisone for this in the past.  She wanted to make sure this was noted in her chart.  She is still going to grief counseling class at her church.  Feels sad/heavy nearly all the time still but says this class is helping.  Says she is sick and tired of feeling like this, but she understands it will only let up when the Reita Cliche says it is time.  Past Medical History:  Diagnosis Date  . Baker's cyst of knee 02/25/2012   LE Venous Doppler May 30, '13 - non-vascular swelling/mass left popliteal fossa c/w Baker's cyst   . CARCINOMA, BREAST, LEFT 1993   Lumpectomy, adjuvant XRT, then tamoxifen.  Gets annual mammograms.  . DEPRESSION, SITUATIONAL 03/26/2009   Qualifier: Diagnosis of  By: Linda Hedges MD, [place] 06/07/2007   Qualifier: Diagnosis of  By: Danny Lawless CMA, [place]    . HYPERLIPIDEMIA 06/07/2007   Qualifier: Diagnosis of  By: Danny Lawless CMA, [place]    . HYPERTENSION 06/07/2007   Qualifier: Diagnosis of  By: Danny Lawless CMA, [place]    . HYSTERECTOMY, HX OF 06/07/2007   Qualifier: Diagnosis of  By: Danny Lawless CMA, [place]    . Personal  history of surgery to other organs 06/07/2007   Centricity Description: BLADDER REPAIR, HX OF Qualifier: Diagnosis of  By: [place], [place]   Centricity Description: LUMPECTOMY, BREAST, HX OF Qualifier: Diagnosis of  By: [place], [place]    . POSTMENOPAUSAL OSTEOPOROSIS 11/18/2010   Qualifier: Diagnosis of  By: Linda Hedges MD, Heinz Knuckles     Past Surgical History:  Procedure Laterality Date  . ABDOMINAL HYSTERECTOMY     Benign reasons per pt, but she can't recall exactly what dx.  Ovaries and tubes are still in.    Marland Kitchen BLADDER REPAIR    . BREAST SURGERY  1993   lumpectomy w/adjuvant XRT  . EYE SURGERY  2011   cataract surgery [facility]     Outpatient Medications Prior to Visit  Medication Sig Dispense Refill  . ALPHAGAN P 0.1 % SOLN     . amLODipine (NORVASC) 10 MG tablet Take 1 tablet (10 mg total) by mouth daily. 90 tablet 3  . dorzolamide-timolol (COSOPT) 22.3-6.8 MG/ML ophthalmic solution   12  . hydrochlorothiazide (HYDRODIURIL) 25 MG tablet Take 1 tablet (25 mg total) by mouth daily. 90 tablet 3  . latanoprost (XALATAN) 0.005 % ophthalmic solution Place 1 drop into both eyes at bedtime.    Marland Kitchen losartan (COZAAR) 100 MG tablet Take 1 tablet (100 mg total) by mouth daily.  90 tablet 3  . metoprolol succinate (TOPROL-XL) 25 MG 24 hr tablet Take 1 tablet (25 mg total) by mouth every morning. 90 tablet 3  . Multiple Vitamins-Minerals (ICAPS AREDS FORMULA PO) Take 2 capsules by mouth 2 (two) times daily.    . timolol (TIMOPTIC) 0.5 % ophthalmic solution     . neomycin-polymyxin b-dexamethasone (MAXITROL) 3.5-10000-0.1 OINT Place 1 application into the left eye 4 (four) times daily.     No facility-administered medications prior to visit.     Allergies  Allergen Reactions  . Atorvastatin Other (See Comments)    Leg myalgias    ROS As per HPI  PE: Blood pressure (!) 158/76, pulse 72, temperature 98 F (36.7 C), temperature source Oral, resp. rate 16, weight 152 lb 4 oz (69.1 kg), SpO2  96 %. Gen: Alert, well appearing.  Patient is oriented to person, place, time, and situation. AFFECT: pleasant, lucid thought and speech. No further exam today.  LABS:  none  IMPRESSION AND PLAN:  Complicated grief, anxiety and depressed mood: she is hanging in there, just needed to repeat things to me and get reassurance that she was on the right track, etc.  She has not tolerated a trial of benzo or SSRI. Continue grief counseling class with church and she will continue to try her best to keep up her usual/routine daily activities.    Flu vaccine given today.  Spent 25 min with pt today, with >50% of this time spent in counseling and care coordination regarding the above problems.  FOLLOW UP: Return in about 4 weeks (around 09/16/2016) for f/u grief.  Signed:  Crissie Sickles, MD           08/19/2016

## 2016-08-31 ENCOUNTER — Ambulatory Visit: Payer: Medicare Other | Admitting: Family Medicine

## 2016-09-17 ENCOUNTER — Encounter: Payer: Self-pay | Admitting: Family Medicine

## 2016-09-17 ENCOUNTER — Ambulatory Visit (INDEPENDENT_AMBULATORY_CARE_PROVIDER_SITE_OTHER): Payer: Medicare Other | Admitting: Family Medicine

## 2016-09-17 VITALS — BP 132/64 | HR 66 | Temp 98.0°F | Resp 16 | Ht 60.0 in | Wt 149.2 lb

## 2016-09-17 DIAGNOSIS — R4182 Altered mental status, unspecified: Secondary | ICD-10-CM

## 2016-09-17 DIAGNOSIS — I10 Essential (primary) hypertension: Secondary | ICD-10-CM

## 2016-09-17 DIAGNOSIS — F4320 Adjustment disorder, unspecified: Secondary | ICD-10-CM | POA: Diagnosis not present

## 2016-09-17 DIAGNOSIS — F411 Generalized anxiety disorder: Secondary | ICD-10-CM

## 2016-09-17 DIAGNOSIS — R531 Weakness: Secondary | ICD-10-CM

## 2016-09-17 DIAGNOSIS — R011 Cardiac murmur, unspecified: Secondary | ICD-10-CM

## 2016-09-17 DIAGNOSIS — R0989 Other specified symptoms and signs involving the circulatory and respiratory systems: Secondary | ICD-10-CM

## 2016-09-17 NOTE — Progress Notes (Signed)
Pre visit review using our clinic review tool, if applicable. No additional management support is needed unless otherwise documented below in the visit note. 

## 2016-09-17 NOTE — Progress Notes (Signed)
OFFICE VISIT  09/17/2016   CC:  Chief Complaint  Patient presents with  . Follow-up    Grief   HPI:    Patient is a 82 y.o. Caucasian female who presents accompanied by her sister for 1 mo f/u prolonged grief response to the death of her husband, characterized by excessive anxiety, depressed mood, and intermittent mild cognitive dysfunction when she feels overwhelmed by things.  Has been taking part in church grief counseling group but recently stopped this. Past attempts to use antidepressants and benzos led to more problems than good, so we decided to do without these unless depression gets severe. Not doing well.  Says she has "lost it" but she has trouble being more specific.  She denies feeling sad but says she feels very anxious.  She admits to having some "panic attacks" but absolutely cannot describe what she means by that.  Denies any pain. Sleep is not as good last 2-3 nights. Hasn't felt up to going to church last 3 Sundays.  No home bp monitoring being done.  Denies any focal weakness, slurred speech, swallowing problems, dizziness, HA, or vision complaints.  Past Medical History:  Diagnosis Date  . Baker's cyst of knee 02/25/2012   LE Venous Doppler May 30, '13 - non-vascular swelling/mass left popliteal fossa c/w Baker's cyst   . CARCINOMA, BREAST, LEFT 1993   Lumpectomy, adjuvant XRT, then tamoxifen.  Gets annual mammograms.  . DEPRESSION, SITUATIONAL 03/26/2009   Qualifier: Diagnosis of  By: Linda Hedges MD, [place] 06/07/2007   Qualifier: Diagnosis of  By: Danny Lawless CMA, [place]    . HYPERLIPIDEMIA 06/07/2007   Qualifier: Diagnosis of  By: Danny Lawless CMA, [place]    . HYPERTENSION 06/07/2007   Qualifier: Diagnosis of  By: Danny Lawless CMA, [place]    . HYSTERECTOMY, HX OF 06/07/2007   Qualifier: Diagnosis of  By: Danny Lawless CMA, [place]    . Personal history of surgery to other organs 06/07/2007   Centricity Description: BLADDER REPAIR, HX OF Qualifier: Diagnosis of  By: [place], [place]   Centricity Description: LUMPECTOMY, BREAST, HX OF Qualifier: Diagnosis of  By: [place], [place]    . POSTMENOPAUSAL OSTEOPOROSIS 11/18/2010   Qualifier: Diagnosis of  By: Linda Hedges MD, Heinz Knuckles     Past Surgical History:  Procedure Laterality Date  . ABDOMINAL HYSTERECTOMY     Benign reasons per pt, but she can't recall exactly what dx.  Ovaries and tubes are still in.    Marland Kitchen BLADDER REPAIR    . BREAST SURGERY  1993   lumpectomy w/adjuvant XRT  . EYE SURGERY  2011   cataract surgery [facility]     Outpatient Medications Prior to Visit  Medication Sig Dispense Refill  . ALPHAGAN P 0.1 % SOLN     . amLODipine (NORVASC) 10 MG tablet Take 1 tablet (10 mg total) by mouth daily. 90 tablet 3  . hydrochlorothiazide (HYDRODIURIL) 25 MG tablet Take 1 tablet (25 mg total) by mouth daily. 90 tablet 3  . latanoprost (XALATAN) 0.005 % ophthalmic solution Place 1 drop into both eyes at bedtime.    Marland Kitchen losartan (COZAAR) 100 MG tablet Take 1 tablet (100 mg total) by mouth daily. 90 tablet 3  . metoprolol succinate (TOPROL-XL) 25 MG 24 hr tablet Take 1 tablet (25 mg total) by mouth every morning. 90 tablet 3  . Multiple Vitamins-Minerals (ICAPS AREDS FORMULA PO) Take 2 capsules by mouth 2 (two) times daily.    . timolol (TIMOPTIC)  0.5 % ophthalmic solution     . dorzolamide-timolol (COSOPT) 22.3-6.8 MG/ML ophthalmic solution   12   No facility-administered medications prior to visit.     Allergies  Allergen Reactions  . Atorvastatin Other (See Comments)    Leg myalgias  . Prednisone Other (See Comments)    Various cognitive/psychiatric side effects    ROS As per HPI  PE: Blood pressure 132/64, pulse 66, temperature 98 F (36.7 C), temperature source Oral, resp. rate 16, height 5' (1.524 m), weight 149 lb 4 oz (67.7 kg), SpO2 94 %. Repeat bp at end of visit was 132/64. Gen: Alert, well appearing.  Patient is oriented to person, place, time, and situation. AFFECT: pleasant, lucid  thought and speech. Neck: supple/nontender.  No LAD, mass, or TM.  Carotid pulses 2+ bilaterally, without bruits. CV: RRR, soft diastolic murmur, no rub or gallop. Chest is clear, no wheezing or rales. Normal symmetric air entry throughout both lung fields. No chest wall deformities or tenderness. ABD: soft, NT/ND EXT: no clubbing, cyanosis, or edema.  Neuro: CN 2-12 intact bilaterally, strength 5/5 in proximal and distal right UE and right LE, but there is subtle diminished strength (4+/5) prox and dist in LUE and LLE.  Patellar DTR trace on right, 1+ on left.  Achilles DTR 1+ bilat.   No tremor.    No ataxia. No pronator drift.  LABS:    Chemistry      Component Value Date/Time   NA 141 05/20/2016 1307   K 4.5 05/20/2016 1307   CL 107 05/20/2016 1307   CO2 24 05/20/2016 1307   BUN 18 05/20/2016 1307   CREATININE 0.77 05/20/2016 1307      Component Value Date/Time   CALCIUM 9.6 05/20/2016 1307   ALKPHOS 78 01/17/2014 1129   AST 19 01/17/2014 1129   ALT 12 01/17/2014 1129   BILITOT 0.7 01/17/2014 1129     Lab Results  Component Value Date   WBC 10.6 (H) 01/10/2011   HGB 13.2 01/10/2011   HCT 40.2 01/10/2011   MCV 94.4 01/10/2011   PLT 274 01/10/2011    IMPRESSION AND PLAN:  1) Grief response, atypical/prolonged, with excessive anxiety: pt was agreeable to referral to [facility] so I ordered this today.  2) Mental "fogginess" with subtle left arm and leg weakness detected on exam: will check CT head to r/o significant CVA, mass lesion, subdural hematoma.  3) Diastolic murmur, with widened pulse pressure: will check echocardiogram (? Aortic insufficiency).  4) HTN: some erratic bp's esp when anxious.  CAme down here in office today. Continue to monitor at home and no med changes made today.  An After Visit Summary was printed and given to the patient.  FOLLOW UP: Return in about 2 weeks (around 10/01/2016) for f/u recent testing+ anxiety.  Signed:  Crissie Sickles, MD           09/17/2016

## 2016-09-18 ENCOUNTER — Ambulatory Visit (HOSPITAL_BASED_OUTPATIENT_CLINIC_OR_DEPARTMENT_OTHER)
Admission: RE | Admit: 2016-09-18 | Discharge: 2016-09-18 | Disposition: A | Discharge status: Home / Self Care | Payer: Medicare Other | Source: Ambulatory Visit | Attending: Family Medicine | Admitting: Family Medicine

## 2016-09-18 ENCOUNTER — Encounter: Payer: Self-pay | Admitting: Family Medicine

## 2016-09-18 DIAGNOSIS — R4182 Altered mental status, unspecified: Secondary | ICD-10-CM | POA: Diagnosis not present

## 2016-09-18 DIAGNOSIS — R531 Weakness: Secondary | ICD-10-CM | POA: Diagnosis not present

## 2016-09-28 DIAGNOSIS — I351 Nonrheumatic aortic (valve) insufficiency: Secondary | ICD-10-CM

## 2016-09-28 HISTORY — DX: Nonrheumatic aortic (valve) insufficiency: I35.1

## 2016-09-29 ENCOUNTER — Ambulatory Visit (INDEPENDENT_AMBULATORY_CARE_PROVIDER_SITE_OTHER): Payer: Medicare Other | Admitting: Family Medicine

## 2016-09-29 ENCOUNTER — Encounter: Payer: Self-pay | Admitting: Family Medicine

## 2016-09-29 VITALS — BP 110/61 | HR 79 | Temp 98.0°F | Resp 16 | Ht 60.0 in | Wt 145.5 lb

## 2016-09-29 DIAGNOSIS — R002 Palpitations: Secondary | ICD-10-CM

## 2016-09-29 DIAGNOSIS — F419 Anxiety disorder, unspecified: Secondary | ICD-10-CM

## 2016-09-29 DIAGNOSIS — R634 Abnormal weight loss: Secondary | ICD-10-CM

## 2016-09-29 DIAGNOSIS — R011 Cardiac murmur, unspecified: Secondary | ICD-10-CM

## 2016-09-29 DIAGNOSIS — R4189 Other symptoms and signs involving cognitive functions and awareness: Secondary | ICD-10-CM

## 2016-09-29 DIAGNOSIS — I10 Essential (primary) hypertension: Secondary | ICD-10-CM

## 2016-09-29 LAB — COMPREHENSIVE METABOLIC PANEL
ALT: 12 U/L (ref 0–35)
AST: 16 U/L (ref 0–37)
Albumin: 4.1 g/dL (ref 3.5–5.2)
Alkaline Phosphatase: 90 U/L (ref 39–117)
BUN: 20 mg/dL (ref 6–23)
CHLORIDE: 100 meq/L (ref 96–112)
CO2: 29 meq/L (ref 19–32)
CREATININE: 0.88 mg/dL (ref 0.40–1.20)
Calcium: 10.1 mg/dL (ref 8.4–10.5)
GFR: 64.41 mL/min (ref >60.00)
GLUCOSE: 110 mg/dL — AB (ref 70–99)
Potassium: 4 mEq/L (ref 3.5–5.1)
SODIUM: 139 meq/L (ref 135–145)
Total Bilirubin: 0.8 mg/dL (ref 0.2–1.2)
Total Protein: 6.7 g/dL (ref 6.0–8.3)

## 2016-09-29 LAB — TSH: TSH: 3.17 u[IU]/mL (ref 0.35–4.50)

## 2016-09-29 LAB — CBC WITH DIFFERENTIAL/PLATELET
BASOS ABS: 0.1 10*3/uL (ref 0.0–0.1)
Basophils Relative: 0.6 % (ref 0.0–3.0)
EOS ABS: 0.2 10*3/uL (ref 0.0–0.7)
Eosinophils Relative: 2 % (ref 0.0–5.0)
HCT: 43 % (ref 36.0–46.0)
Hemoglobin: 14.5 g/dL (ref 12.0–15.0)
LYMPHS ABS: 2.2 10*3/uL (ref 0.7–4.0)
LYMPHS PCT: 19.9 % (ref 12.0–46.0)
MCHC: 33.8 g/dL (ref 30.0–36.0)
MCV: 95.3 fl (ref 78.0–100.0)
Monocytes Absolute: 0.9 10*3/uL (ref 0.1–1.0)
Monocytes Relative: 8.2 % (ref 3.0–12.0)
NEUTROS ABS: 7.5 10*3/uL (ref 1.4–7.7)
NEUTROS PCT: 69.3 % (ref 43.0–77.0)
PLATELETS: 277 10*3/uL (ref 150.0–400.0)
RBC: 4.51 Mil/uL (ref 3.87–5.11)
RDW: 13.5 % (ref 11.5–15.5)
WBC: 10.8 10*3/uL — ABNORMAL HIGH (ref 4.0–10.5)

## 2016-09-29 NOTE — Progress Notes (Signed)
OFFICE VISIT  09/29/2016   CC:  Chief Complaint  Patient presents with  . Follow-up    Grief   HPI:    Patient is a 83 y.o. Caucasian female who presents for 2 week f/u anxiety/atypical and prolonged grief reaction. Last visit I referred her to [facility].  No appt is made yet.  I also detected subtle L arm and L leg weakness on exam so I checked at CT head and this was essentially unremarkable except for age approp microvascular changes. She had a soft diastolic murmur as well, with widened pulse pressure, so I ordered an echocardiogram--this has not been done yet but is scheduled for later this month. Still very anxious, says she is falling apart.  Again, she and her sister have difficulty putting any of this into distinct symptomatology.  She affirms that she is having trouble with her memory. She is not eating as much as normal and has lost some weight.  Also not drinking enough fluids per sister's report.  She has moved in with her sister now.  Sister says she doesn't act like she has a good appetite. Today she ate cereal/bananas/coffee/water.    Past Medical History:  Diagnosis Date  . Baker's cyst of knee 02/25/2012   LE Venous Doppler May 30, '13 - non-vascular swelling/mass left popliteal fossa c/w Baker's cyst   . CARCINOMA, BREAST, LEFT 1993   Lumpectomy, adjuvant XRT, then tamoxifen.  Gets annual mammograms.  . DEPRESSION, SITUATIONAL 03/26/2009   Qualifier: Diagnosis of  By: Linda Hedges MD, [place] 06/07/2007   Qualifier: Diagnosis of  By: Danny Lawless CMA, [place]    . HYPERLIPIDEMIA 06/07/2007   Qualifier: Diagnosis of  By: Danny Lawless CMA, [place]    . HYPERTENSION 06/07/2007   Qualifier: Diagnosis of  By: Danny Lawless CMA, [place]    . HYSTERECTOMY, HX OF 06/07/2007   Qualifier: Diagnosis of  By: Danny Lawless CMA, [place]    . Personal history of surgery to other organs 06/07/2007   Centricity Description: BLADDER REPAIR, HX OF Qualifier: Diagnosis of  By: [place], [place]    Centricity Description: LUMPECTOMY, BREAST, HX OF Qualifier: Diagnosis of  By: [place], [place]    . POSTMENOPAUSAL OSTEOPOROSIS 11/18/2010   Qualifier: Diagnosis of  By: Linda Hedges MD, Heinz Knuckles     Past Surgical History:  Procedure Laterality Date  . ABDOMINAL HYSTERECTOMY     Benign reasons per pt, but she can't recall exactly what dx.  Ovaries and tubes are still in.    Marland Kitchen BLADDER REPAIR    . BREAST SURGERY  1993   lumpectomy w/adjuvant XRT  . EYE SURGERY  2011   cataract surgery [facility]     Outpatient Medications Prior to Visit  Medication Sig Dispense Refill  . ALPHAGAN P 0.1 % SOLN     . amLODipine (NORVASC) 10 MG tablet Take 1 tablet (10 mg total) by mouth daily. 90 tablet 3  . AZOPT 1 % ophthalmic suspension Place 1 drop into both eyes 2 (two) times daily.    . hydrochlorothiazide (HYDRODIURIL) 25 MG tablet Take 1 tablet (25 mg total) by mouth daily. 90 tablet 3  . latanoprost (XALATAN) 0.005 % ophthalmic solution Place 1 drop into both eyes at bedtime.    Marland Kitchen losartan (COZAAR) 100 MG tablet Take 1 tablet (100 mg total) by mouth daily. 90 tablet 3  . metoprolol succinate (TOPROL-XL) 25 MG 24 hr tablet Take 1 tablet (25 mg total) by mouth every morning. [place]  tablet 3  . Multiple Vitamins-Minerals (ICAPS AREDS FORMULA PO) Take 2 capsules by mouth 2 (two) times daily.    . timolol (TIMOPTIC) 0.5 % ophthalmic solution      No facility-administered medications prior to visit.     Allergies  Allergen Reactions  . Atorvastatin Other (See Comments)    Leg myalgias  . Prednisone Other (See Comments)    Various cognitive/psychiatric side effects    ROS As per HPI  PE: Blood pressure 110/61, pulse 79, temperature 98 F (36.7 C), temperature source Oral, resp. rate 16, height 5' (1.524 m), weight 145 lb 8 oz (66 kg), SpO2 94 %. Gen: Alert, well appearing.  Patient is oriented to person, place, time, and situation. ENT: oral mucosa moist except tongue appears to be dry. NECK: no  thyromegaly CV: RRR, no m/r/g.   LUNGS: CTA bilat, nonlabored resps, good aeration in all lung fields.   LABS:  Lab Results  Component Value Date   TSH 2.99 04/12/2008   Lab Results  Component Value Date   WBC 10.6 (H) 01/10/2011   HGB 13.2 01/10/2011   HCT 40.2 01/10/2011   MCV 94.4 01/10/2011   PLT 274 01/10/2011     Chemistry      Component Value Date/Time   NA 141 05/20/2016 1307   K 4.5 05/20/2016 1307   CL 107 05/20/2016 1307   CO2 24 05/20/2016 1307   BUN 18 05/20/2016 1307   CREATININE 0.77 05/20/2016 1307      Component Value Date/Time   CALCIUM 9.6 05/20/2016 1307   ALKPHOS 78 01/17/2014 1129   AST 19 01/17/2014 1129   ALT 12 01/17/2014 1129   BILITOT 0.7 01/17/2014 1129       IMPRESSION AND PLAN:  1) Anxiety, abnormal grief reaction, cognitive dysfunction: I believe she may be showing more signs of dementia. Will pursue this more at future appt. She'll hopefully start counseling for abnormal grief rxn with [facility] soon. Again, meds in the past have been counterproductive. Check TSH, CBC, and CMET today.  2) Diastolic heart murmur: I could not detect this today. Will still go ahead with getting her echo.  An After Visit Summary was printed and given to the patient.  FOLLOW UP: Return in about 4 weeks (around 10/27/2016).  Signed:  Crissie Sickles, MD           09/29/2016

## 2016-09-29 NOTE — Progress Notes (Signed)
Pre visit review using our clinic review tool, if applicable. No additional management support is needed unless otherwise documented below in the visit note. 

## 2016-10-09 ENCOUNTER — Other Ambulatory Visit: Payer: Self-pay

## 2016-10-09 ENCOUNTER — Ambulatory Visit (HOSPITAL_COMMUNITY): Payer: Medicare Other | Attending: Cardiology

## 2016-10-09 DIAGNOSIS — I083 Combined rheumatic disorders of mitral, aortic and tricuspid valves: Secondary | ICD-10-CM | POA: Insufficient documentation

## 2016-10-09 DIAGNOSIS — R011 Cardiac murmur, unspecified: Secondary | ICD-10-CM

## 2016-10-09 DIAGNOSIS — R0989 Other specified symptoms and signs involving the circulatory and respiratory systems: Secondary | ICD-10-CM | POA: Insufficient documentation

## 2016-10-09 HISTORY — PX: TRANSTHORACIC ECHOCARDIOGRAM: SHX275

## 2016-10-11 ENCOUNTER — Encounter: Payer: Self-pay | Admitting: Family Medicine

## 2016-10-13 ENCOUNTER — Emergency Department (HOSPITAL_COMMUNITY)
Admission: EM | Admit: 2016-10-13 | Discharge: 2016-10-13 | Disposition: A | Discharge status: Home / Self Care | Payer: Medicare Other | Attending: Emergency Medicine | Admitting: Emergency Medicine

## 2016-10-13 ENCOUNTER — Encounter (HOSPITAL_COMMUNITY): Payer: Self-pay

## 2016-10-13 ENCOUNTER — Telehealth: Payer: Self-pay | Admitting: Family Medicine

## 2016-10-13 DIAGNOSIS — F329 Major depressive disorder, single episode, unspecified: Secondary | ICD-10-CM | POA: Insufficient documentation

## 2016-10-13 DIAGNOSIS — I10 Essential (primary) hypertension: Secondary | ICD-10-CM | POA: Insufficient documentation

## 2016-10-13 DIAGNOSIS — F32A Depression, unspecified: Secondary | ICD-10-CM

## 2016-10-13 DIAGNOSIS — F419 Anxiety disorder, unspecified: Secondary | ICD-10-CM | POA: Diagnosis present

## 2016-10-13 DIAGNOSIS — Z79899 Other long term (current) drug therapy: Secondary | ICD-10-CM | POA: Diagnosis not present

## 2016-10-13 NOTE — ED Triage Notes (Signed)
Pt c/o "difficulty concentrating" over a month.  Pt's sister reports social anxiety and "being scared of people" x over a month.  Pt reports her husband past away April 2017.  Also, Pt lost a son in 64 and was forced to move in 2010.  Pt reports that she has not moved past either issue.  Pt sts "I'm just crazy."

## 2016-10-13 NOTE — ED Notes (Signed)
Bed: WA18 Expected date:  Expected time:  Means of arrival:  Comments: EMS-flu like symptoms 

## 2016-10-13 NOTE — Telephone Encounter (Signed)
Patient's sister(Amy Turner) calling on behalf of pt to report pt was taken to [facility] and was recommended to follow up with a psychiatrist.  They were given [facility]  number to call for an appointment.  However, she prefers to see someone who is fluent in [place] because she is hard of hearing and if the provider has a foreign accent it makes it that much more hard for her to understand.    She would like Dr. Anitra Lauth to recommend another psychiatrist patient can see.  [name] was informed pcp out of office this afternoon and message would be addressed when he returns to office.

## 2016-10-13 NOTE — ED Provider Notes (Signed)
[facility] DEPT Provider Note   CSN: [id number] Arrival date & time: 10/13/16  1006     History   Chief Complaint Chief Complaint  Patient presents with  . Anxiety    HPI Amy Turner is a 83 y.o. female.  HPI Patient presents the emergency department with reports of decreased sleep over the past several weeks over the past several months has had decreasing energy and not wanting to leave the house as much.  She is not on any antidepressant medications.  She lost her husband in the spring of 2017.  She continues to dress herself and bathe herself and feed herself.  Her family is concerned that she is depressed and they contacted her primary care doctor office today who recommended she come to the ER for evaluation.  She is scheduled to see her primary care physician on January 30.  She denies fevers.  Family reports no altered mental status.  She has no medical complaints   Past Medical History:  Diagnosis Date  . Aortic insufficiency 09/2016   mild/mod aortic insufficiency, and mild mitral insufficiency.--Repeat echo 1 yr.  . Baker's cyst of knee 02/25/2012   LE Venous Doppler May 30, '13 - non-vascular swelling/mass left popliteal fossa c/w Baker's cyst   . CARCINOMA, BREAST, LEFT 1993   Lumpectomy, adjuvant XRT, then tamoxifen.  Gets annual mammograms.  . DEPRESSION, SITUATIONAL 03/26/2009   Qualifier: Diagnosis of  By: Linda Hedges MD, [place] 06/07/2007   Qualifier: Diagnosis of  By: Danny Lawless CMA, [place]    . HYPERLIPIDEMIA 06/07/2007   Qualifier: Diagnosis of  By: Danny Lawless CMA, [place]    . HYPERTENSION 06/07/2007   Qualifier: Diagnosis of  By: Danny Lawless CMA, [place]    . HYSTERECTOMY, HX OF 06/07/2007   Qualifier: Diagnosis of  By: Danny Lawless CMA, [place]    . Personal history of surgery to other organs 06/07/2007   Centricity Description: BLADDER REPAIR, HX OF Qualifier: Diagnosis of  By: [place], [place]   Centricity Description: LUMPECTOMY, BREAST, HX OF Qualifier:  Diagnosis of  By: [place], [place]    . POSTMENOPAUSAL OSTEOPOROSIS 11/18/2010   Qualifier: Diagnosis of  By: Linda Hedges MD, Heinz Knuckles     Patient Active Problem List   Diagnosis Date Noted  . Grief reaction 04/07/2016  . Medicare annual wellness visit, subsequent 10/21/2015  . Polypharmacy 10/03/2014  . Cervical muscle pain 02/12/2014  . Cerumen impaction 01/17/2014  . Routine health maintenance 03/08/2012  . POSTMENOPAUSAL OSTEOPOROSIS 11/18/2010  . DEPRESSION, SITUATIONAL 03/26/2009  . Hyperlipidemia 06/07/2007  . GLAUCOMA 06/07/2007  . Essential hypertension 06/07/2007    Past Surgical History:  Procedure Laterality Date  . ABDOMINAL HYSTERECTOMY     Benign reasons per pt, but she can't recall exactly what dx.  Ovaries and tubes are still in.    Marland Kitchen BLADDER REPAIR    . BREAST SURGERY  1993   lumpectomy w/adjuvant XRT  . EYE SURGERY  2011   cataract surgery [facility]   . TRANSTHORACIC ECHOCARDIOGRAM  10/09/2016   EF 60-65%, normal wall motion, grd I DD, mild/mod aortic regurg, mild mitral regurg.    OB History    No data available       Home Medications    Prior to Admission medications   Medication Sig Start Date End Date Taking? Authorizing Provider  ALPHAGAN P 0.1 % SOLN  06/09/16   Historical Provider, MD  amLODipine (NORVASC) 10 MG tablet Take 1 tablet (10 mg total) by  mouth daily. 10/21/15   Tammi Sou, MD  AZOPT 1 % ophthalmic suspension Place 1 drop into both eyes 2 (two) times daily. 09/04/16   Historical Provider, MD  hydrochlorothiazide (HYDRODIURIL) 25 MG tablet Take 1 tablet (25 mg total) by mouth daily. 10/21/15   Tammi Sou, MD  latanoprost (XALATAN) 0.005 % ophthalmic solution Place 1 drop into both eyes at bedtime.    Historical Provider, MD  losartan (COZAAR) 100 MG tablet Take 1 tablet (100 mg total) by mouth daily. 10/21/15   Tammi Sou, MD  metoprolol succinate (TOPROL-XL) 25 MG 24 hr tablet Take 1 tablet (25 mg total) by mouth every  morning. 09/16/15   Tammi Sou, MD  Multiple Vitamins-Minerals (ICAPS AREDS FORMULA PO) Take 2 capsules by mouth 2 (two) times daily.    Historical Provider, MD  timolol (TIMOPTIC) 0.5 % ophthalmic solution  06/12/16   Historical Provider, MD    Family History Family History  Problem Relation Age of Onset  . Hyperlipidemia Mother   . Hypertension Mother   . Cervical cancer Mother   . Coronary artery disease Mother   . Depression Father   . Colon cancer Neg Hx   . Diabetes Neg Hx     Social History Social History  Substance Use Topics  . Smoking status: Never Smoker  . Smokeless tobacco: Never Used  . Alcohol use No     Allergies   Atorvastatin and Prednisone   Review of Systems Review of Systems  All other systems reviewed and are negative.    Physical Exam Updated Vital Signs BP 135/57 (BP Location: Right Arm)   Pulse 68   Temp 97.4 F (36.3 C) (Oral)   Resp 18   Ht 5' (1.524 m)   Wt 145 lb (65.8 kg)   SpO2 96%   BMI 28.32 kg/m   Physical Exam  Constitutional: She is oriented to person, place, and time. She appears well-developed and well-nourished.  HENT:  Head: Normocephalic.  Eyes: EOM are normal.  Neck: Normal range of motion.  Pulmonary/Chest: Effort normal.  Abdominal: She exhibits no distension.  Musculoskeletal: Normal range of motion.  Neurological: She is alert and oriented to person, place, and time.  Psychiatric:  Flat affect. No HI or SI  Nursing note and vitals reviewed.    ED Treatments / Results  Labs (all labs ordered are listed, but only abnormal results are displayed) Labs Reviewed - No data to display  EKG  EKG Interpretation None       Radiology No results found.  Procedures Procedures (including critical care time)  Medications Ordered in ED Medications - No data to display   Initial Impression / Assessment and Plan / ED Course  I have reviewed the triage vital signs and the nursing notes.  Pertinent  labs & imaging results that were available during my care of the patient were reviewed by me and considered in my medical decision making (see chart for details).  Clinical Course     no indication for medical workup.  No indication for imaging or labs.  I do not think she needs acute psychiatric hospitalization.patient be discharged home to follow-up with her primary care physician.  She was given mental health resources.  No HI or SI.  Final Clinical Impressions(s) / ED Diagnoses   Final diagnoses:  Depression, unspecified depression type    New Prescriptions New Prescriptions   No medications on file     Jola Schmidt, MD 10/13/16  1117  

## 2016-10-16 ENCOUNTER — Other Ambulatory Visit: Payer: Self-pay | Admitting: Family Medicine

## 2016-10-16 DIAGNOSIS — F4320 Adjustment disorder, unspecified: Secondary | ICD-10-CM

## 2016-10-16 DIAGNOSIS — F329 Major depressive disorder, single episode, unspecified: Secondary | ICD-10-CM

## 2016-10-16 DIAGNOSIS — F419 Anxiety disorder, unspecified: Secondary | ICD-10-CM

## 2016-10-16 NOTE — Telephone Encounter (Signed)
Please advise. Thanks.  

## 2016-10-16 NOTE — Telephone Encounter (Signed)
Tried to call, number busy.

## 2016-10-16 NOTE — Telephone Encounter (Signed)
I'll put in a referral order to [facility] psychiatrist (but I believe the pt/family still has to call them to request the appt.  They can specify that they prefer a doctor with no accent, and I'll put this info in the referral order as well)--thx

## 2016-10-19 NOTE — Telephone Encounter (Signed)
Patient's sister advised. Okay per DPR.

## 2016-10-27 ENCOUNTER — Ambulatory Visit (INDEPENDENT_AMBULATORY_CARE_PROVIDER_SITE_OTHER): Payer: Medicare Other | Admitting: Family Medicine

## 2016-10-27 ENCOUNTER — Encounter: Payer: Self-pay | Admitting: Family Medicine

## 2016-10-27 VITALS — BP 130/58 | HR 70 | Temp 97.7°F | Resp 16 | Ht 60.0 in | Wt 146.8 lb

## 2016-10-27 DIAGNOSIS — F329 Major depressive disorder, single episode, unspecified: Secondary | ICD-10-CM

## 2016-10-27 DIAGNOSIS — I10 Essential (primary) hypertension: Secondary | ICD-10-CM | POA: Diagnosis not present

## 2016-10-27 DIAGNOSIS — F4320 Adjustment disorder, unspecified: Secondary | ICD-10-CM

## 2016-10-27 DIAGNOSIS — F418 Other specified anxiety disorders: Secondary | ICD-10-CM | POA: Diagnosis not present

## 2016-10-27 DIAGNOSIS — F32A Depression, unspecified: Secondary | ICD-10-CM

## 2016-10-27 DIAGNOSIS — F419 Anxiety disorder, unspecified: Principal | ICD-10-CM

## 2016-10-27 MED ORDER — SERTRALINE HCL 50 MG PO TABS: 50.0000 mg | ORAL_TABLET | Freq: Every day | ORAL | 1 refills | Status: DC

## 2016-10-27 NOTE — Progress Notes (Signed)
Pre visit review using our clinic review tool, if applicable. No additional management support is needed unless otherwise documented below in the visit note. 

## 2016-10-27 NOTE — Progress Notes (Signed)
OFFICE VISIT  10/27/2016   CC:  Chief Complaint  Patient presents with  . Follow-up    Grief   HPI:    Patient is a 83 y.o. Caucasian female who presents accompanied by her sister for f/u abnormal/prolonged grief reaction, anxiety/depression. She recently went to the ED 10/13/16 for these symptoms--feeling overwhelmed.  She was not deemed appropriate for inpatient mgmt, no new meds started, was told to f/u with me.  Still struggling like the last several months.  Has probs sleeping, has anxious and depressed mood, feels overwhelmed all the time, gets out of house to go to church only.  She is afraid to drive due to her anxiety/scared of causing an accident.  Has not made counseling appointment yet but has phone # to call. Spends time in her home doing word search, adult coloring books, reads magazines, does a daily devotional. Has trouble keeping her mind busy.  She refers to her trouble with "keeping on top of things" the way she used to. Her sister says she spends too much time living in the past. She is open to a trial of another medication.    BP: no home bp checks to report.   Past Medical History:  Diagnosis Date  . Aortic insufficiency 09/2016   mild/mod aortic insufficiency, and mild mitral insufficiency.--Repeat echo 1 yr.  . Baker's cyst of knee 02/25/2012   LE Venous Doppler May 30, '13 - non-vascular swelling/mass left popliteal fossa c/w Baker's cyst   . CARCINOMA, BREAST, LEFT 1993   Lumpectomy, adjuvant XRT, then tamoxifen.  Gets annual mammograms.  . DEPRESSION, SITUATIONAL 03/26/2009   Qualifier: Diagnosis of  By: Linda Hedges MD, [place] 06/07/2007   Qualifier: Diagnosis of  By: Danny Lawless CMA, [place]    . HYPERLIPIDEMIA 06/07/2007   Qualifier: Diagnosis of  By: Danny Lawless CMA, [place]    . HYPERTENSION 06/07/2007   Qualifier: Diagnosis of  By: Danny Lawless CMA, [place]    . HYSTERECTOMY, HX OF 06/07/2007   Qualifier: Diagnosis of  By: Danny Lawless CMA, [place]    . Personal  history of surgery to other organs 06/07/2007   Centricity Description: BLADDER REPAIR, HX OF Qualifier: Diagnosis of  By: [place], [place]   Centricity Description: LUMPECTOMY, BREAST, HX OF Qualifier: Diagnosis of  By: [place], [place]    . POSTMENOPAUSAL OSTEOPOROSIS 11/18/2010   Qualifier: Diagnosis of  By: Linda Hedges MD, [place] Knuckles     Past Surgical History:  Procedure Laterality Date  . ABDOMINAL HYSTERECTOMY     Benign reasons per pt, but she can't recall exactly what dx.  Ovaries and tubes are still in.    Marland Kitchen BLADDER REPAIR    . BREAST SURGERY  1993   lumpectomy w/adjuvant XRT  . EYE SURGERY  2011   cataract surgery [facility]   . TRANSTHORACIC ECHOCARDIOGRAM  10/09/2016   EF 60-65%, normal wall motion, grd I DD, mild/mod aortic regurg, mild mitral regurg.    Outpatient Medications Prior to Visit  Medication Sig Dispense Refill  . ALPHAGAN P 0.1 % SOLN     . amLODipine (NORVASC) 10 MG tablet Take 1 tablet (10 mg total) by mouth daily. 90 tablet 3  . AZOPT 1 % ophthalmic suspension Place 1 drop into both eyes 2 (two) times daily.    . hydrochlorothiazide (HYDRODIURIL) 25 MG tablet Take 1 tablet (25 mg total) by mouth daily. 90 tablet 3  . latanoprost (XALATAN) 0.005 % ophthalmic solution Place 1 drop into both eyes  at bedtime.    Marland Kitchen losartan (COZAAR) 100 MG tablet Take 1 tablet (100 mg total) by mouth daily. 90 tablet 3  . metoprolol succinate (TOPROL-XL) 25 MG 24 hr tablet Take 1 tablet (25 mg total) by mouth every morning. 90 tablet 3  . Multiple Vitamins-Minerals (ICAPS AREDS FORMULA PO) Take 2 capsules by mouth 2 (two) times daily.    . timolol (TIMOPTIC) 0.5 % ophthalmic solution      No facility-administered medications prior to visit.     Allergies  Allergen Reactions  . Atorvastatin Other (See Comments)    Leg myalgias  . Prednisone Other (See Comments)    Various cognitive/psychiatric side effects    ROS As per HPI  PE: Blood pressure (!) 130/58, pulse 70,  temperature 97.7 F (36.5 C), temperature source Oral, resp. rate 16, height 5' (1.524 m), weight 146 lb 12 oz (66.6 kg), SpO2 97 %.Repeat bp w/manual cuff 130/58. Gen: Alert, well appearing.  Patient is oriented to person, place, time, and situation. AFFECT: pleasant, lucid thought and speech. No further exam today.  LABS:  Lab Results  Component Value Date   TSH 3.17 09/29/2016   Lab Results  Component Value Date   WBC 10.8 (H) 09/29/2016   HGB 14.5 09/29/2016   HCT 43.0 09/29/2016   MCV 95.3 09/29/2016   PLT 277.0 09/29/2016   Lab Results  Component Value Date   CREATININE 0.88 09/29/2016   BUN 20 09/29/2016   NA 139 09/29/2016   K 4.0 09/29/2016   CL 100 09/29/2016   CO2 29 09/29/2016   Lab Results  Component Value Date   ALT 12 09/29/2016   AST 16 09/29/2016   ALKPHOS 90 09/29/2016   BILITOT 0.8 09/29/2016   Lab Results  Component Value Date   CHOL 176 01/17/2014   Lab Results  Component Value Date   HDL 57.60 01/17/2014   Lab Results  Component Value Date   LDLCALC 91 01/17/2014   Lab Results  Component Value Date   TRIG 139.0 01/17/2014   Lab Results  Component Value Date   CHOLHDL 3 01/17/2014    IMPRESSION AND PLAN:  1) Abnormal grief response, with marked anxiety and mild depression features. She needs to start counseling; she plans on calling to get appt this afternoon. Will do a trial of low dose sertraline: 50mg  qhs.  Therapeutic expectations and side effect profile of medication discussed today.  Patient's questions answered.  2) HTN: The current medical regimen is effective;  continue present plan and medications.  An After Visit Summary was printed and given to the patient.  Spent 25 min with pt today, with >50% of this time spent in counseling and care coordination regarding the above problems.  FOLLOW UP: Return for 3-4 week f/u grief/anx/dep.  Signed:  Crissie Sickles, MD           10/27/2016

## 2016-11-05 ENCOUNTER — Ambulatory Visit: Payer: Medicare Other | Admitting: Psychology

## 2016-11-05 ENCOUNTER — Ambulatory Visit (HOSPITAL_COMMUNITY)
Admission: RE | Admit: 2016-11-05 | Discharge: 2016-11-05 | Disposition: A | Discharge status: Home / Self Care | Payer: Medicare Other | Attending: Psychiatry | Admitting: Psychiatry

## 2016-11-05 DIAGNOSIS — F418 Other specified anxiety disorders: Secondary | ICD-10-CM | POA: Insufficient documentation

## 2016-11-05 NOTE — BH Assessment (Signed)
Tele Assessment Note   Amy Turner is an 83 y.o. female who presents voluntarily accompanied by her sister and a friend reporting symptoms of depression and anxiety. Pt has a history of anxiety and says she was referred for assessment by [facility] where they went before coming to [facility] Her anxiety is preventing her from living her normal life because she no longer wants to drive to church or leave the house (has not driven since December). She lost her husband of 67 years last May and 10 years ago they were removed from their home due to highway construction ands pt states she has "never recovered" from her life being disrupted and her whole world being relocated. She also lost a son in 1992, and feels that she may not have ever dealt with grief and loss in her life over the years. She states that her faith has helped her and will carry her through.  Pt reports medication compliance and started Zoloft two weeks ago. Pt denies SI, HI, AVH or SA.  Pt's son lives with her and supports include her sister and her son's GF and other family. Pt denies history of abuse and trauma. Pt reports there is a family history of suicide--her father committed suicide at age 35. 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 Pt has good insight and judgment. Pt's memory is normal. Pt denies legal history. ? Pt's OP history includes going to a grief group at her church, which she may start again in March or April. Pt denies IP history. Pt denies alcohol/ substance abuse. ? MSE: Pt is casually dressed, alert, oriented x4 with normal speech and normal motor behavior. Eye contact is good. Pt's mood is depressed and affect is depressed and anxious. Affect is congruent with mood. Thought process is coherent and relevant. There is no indication Pt is currently responding to internal stimuli or experiencing  delusional thought content. Pt was cooperative throughout assessment.   Margarita Grizzle, NP states that pt does not currently meet inpatient psychiatric treatment. Recommends calling PCP to get meds adjusted and following up with April appt with Dr. Casimiro Needle, and [facility] states that pt is on the cancellation list to get an appt sooner.  Diagnosis: Anxiety, Depression   Past Medical History:  Past Medical History:  Diagnosis Date  . Aortic insufficiency 09/2016   mild/mod aortic insufficiency, and mild mitral insufficiency.--Repeat echo 1 yr.  . Baker's cyst of knee 02/25/2012   LE Venous Doppler May 30, '13 - non-vascular swelling/mass left popliteal fossa c/w Baker's cyst   . CARCINOMA, BREAST, LEFT 1993   Lumpectomy, adjuvant XRT, then tamoxifen.  Gets annual mammograms.  . DEPRESSION, SITUATIONAL 03/26/2009   Qualifier: Diagnosis of  By: Linda Hedges MD, [place] 06/07/2007   Qualifier: Diagnosis of  By: Danny Lawless CMA, [place]    . HYPERLIPIDEMIA 06/07/2007   Qualifier: Diagnosis of  By: Danny Lawless CMA, [place]    . HYPERTENSION 06/07/2007   Qualifier: Diagnosis of  By: Danny Lawless CMA, [place]    . HYSTERECTOMY, HX OF 06/07/2007   Qualifier: Diagnosis of  By: Danny Lawless CMA, [place]    . Personal history of surgery to other organs 06/07/2007   Centricity Description: BLADDER REPAIR, HX OF Qualifier: Diagnosis of  By: Danny Lawless CMA, [place]   Centricity Description: LUMPECTOMY, BREAST, HX OF Qualifier: Diagnosis of  By: Danny Lawless CMA, [place]    . POSTMENOPAUSAL OSTEOPOROSIS 11/18/2010   Qualifier: Diagnosis of  By: Linda Hedges MD, Heinz Knuckles     Past Surgical History:  Procedure Laterality Date  . ABDOMINAL HYSTERECTOMY     Benign reasons per pt, but she can't recall exactly what dx.  Ovaries and tubes are still in.    Marland Kitchen BLADDER REPAIR    . BREAST SURGERY  1993   lumpectomy w/adjuvant XRT  . EYE SURGERY  2011   cataract surgery [facility]   . TRANSTHORACIC ECHOCARDIOGRAM  10/09/2016   EF 60-65%, normal wall motion,  grd I DD, mild/mod aortic regurg, mild mitral regurg.    Family History:  Family History  Problem Relation Age of Onset  . Hyperlipidemia Mother   . Hypertension Mother   . Cervical cancer Mother   . Coronary artery disease Mother   . Depression Father   . Colon cancer Neg Hx   . Diabetes Neg Hx     Social History:  reports that she has never smoked. She has never used smokeless tobacco. She reports that she does not drink alcohol or use drugs.  Additional Social History:  Alcohol / Drug Use Pain Medications: denies Prescriptions: denies Over the Counter: denies History of alcohol / drug use?: No history of alcohol / drug abuse Longest period of sobriety (when/how long): denies  CIWA:   COWS:    PATIENT STRENGTHS: (choose at least two) Ability for insight Average or above average intelligence Capable of independent living Communication skills General fund of knowledge Motivation for treatment/growth Physical Health Supportive family/friends  Allergies:  Allergies  Allergen Reactions  . Atorvastatin Other (See Comments)    Leg myalgias  . Prednisone Other (See Comments)    Various cognitive/psychiatric side effects    Home Medications:  (Not in a hospital admission)  OB/GYN Status:  No LMP recorded. Patient has had a hysterectomy.  General Assessment Data Location of Assessment: [facility] Services TTS Assessment: In system Is this a Tele or Face-to-Face Assessment?: Face-to-Face Is this an Initial Assessment or a Re-assessment for this encounter?: Initial Assessment Marital status: Widowed Is patient pregnant?: No Pregnancy Status: No Living Arrangements:  (son) Can pt return to current living arrangement?: Yes Admission Status: Voluntary Is patient capable of signing voluntary admission?: Yes Referral Source: Self/Family/Friend Insurance type: MCR  Medical Screening Exam ([place]) Medical Exam completed: Yes  Crisis Care Plan Living  Arrangements:  (son) Name of Psychiatrist:  (none) Name of Therapist: none  Education Status Is patient currently in school?: No  Risk to self with the past 6 months Suicidal Ideation: No Has patient been a risk to self within the past 6 months prior to admission? : No Suicidal Intent: No Has patient had any suicidal intent within the past 6 months prior to admission? : No Is patient at risk for suicide?: No Suicidal Plan?: No Has patient had any suicidal plan within the past 6 months prior to admission? : No Access to Means: No Previous Attempts/Gestures: No Intentional Self Injurious Behavior: None Family Suicide History: Yes (her father) Recent stressful life event(s): Loss (Comment) (husband died last 06-Mar-2023, lost home to [employer] [facility] constructions) Persecutory voices/beliefs?: No Depression: Yes Depression Symptoms: Insomnia, Isolating, Loss of interest in usual pleasures, Feeling worthless/self pity, Fatigue, Tearfulness Substance abuse history and/or treatment for substance abuse?: No Suicide prevention information given to non-admitted patients: Not applicable  Risk to Others within the past 6 months Homicidal Ideation: No Does patient have  any lifetime risk of violence toward others beyond the six months prior to admission? : No Thoughts of Harm to Others: No Current Homicidal Intent: No Current Homicidal Plan: No Access to Homicidal Means: No History of harm to others?: No Assessment of Violence: None Noted Does patient have access to weapons?: No Criminal Charges Pending?: No Does patient have a court date: No Is patient on probation?: No  Psychosis Hallucinations: None noted Delusions: None noted  Mental Status Report Appearance/Hygiene: Unremarkable Eye Contact: Good Motor Activity: Unremarkable Speech: Logical/coherent Level of Consciousness: Alert Mood: Depressed, Anxious Affect: Anxious, Depressed Anxiety Level: Severe Thought Processes: Coherent,  Relevant Judgement: Unimpaired Orientation: Person, Place, Time, Situation, Appropriate for developmental age Obsessive Compulsive Thoughts/Behaviors: None  Cognitive Functioning Concentration: Poor Memory: Recent Intact, Remote Intact IQ: Average Insight: Fair Impulse Control: Good Appetite: Poor Weight Loss:  (yes) Weight Gain: 0 Sleep: Decreased Total Hours of Sleep:  (3) Vegetative Symptoms: None  ADLScreening [facility] Assessment Services) Patient's cognitive ability adequate to safely complete daily activities?: Yes Patient able to express need for assistance with ADLs?: Yes Independently performs ADLs?: Yes (appropriate for developmental age)  Prior Inpatient Therapy Prior Inpatient Therapy: No  Prior Outpatient Therapy Prior Outpatient Therapy: Yes (grief group at her church) Prior Therapy Dates: last year Reason for Treatment: grief/loss Does patient have an ACCT team?: No Does patient have Intensive In-House Services?  : No Does patient have [facility] services? : No Does patient have P4CC services?: No  ADL Screening (condition at time of admission) Patient's cognitive ability adequate to safely complete daily activities?: Yes Is the patient deaf or have difficulty hearing?: No Does the patient have difficulty seeing, even when wearing glasses/contacts?: No Does the patient have difficulty concentrating, remembering, or making decisions?: No Patient able to express need for assistance with ADLs?: Yes Does the patient have difficulty dressing or bathing?: No Independently performs ADLs?: Yes (appropriate for developmental age) Does the patient have difficulty walking or climbing stairs?: No Weakness of Legs: None Weakness of Arms/Hands: None  Home Assistive Devices/Equipment Home Assistive Devices/Equipment: None    Abuse/Neglect Assessment (Assessment to be complete while patient is alone) Physical Abuse: Denies Verbal Abuse: Denies Sexual Abuse:  Denies Exploitation of patient/patient's resources: Denies Self-Neglect: Denies Values / Beliefs Cultural Requests During Hospitalization: None Spiritual Requests During Hospitalization: None Consults Spiritual Care Consult Needed: No Social Work Consult Needed: No Regulatory affairs officer (For Healthcare) Does Patient Have a Medical Advance Directive?: No    Additional Information 1:1 In Past 12 Months?: No CIRT Risk: No Elopement Risk: No Does patient have medical clearance?: No     Disposition:  Disposition Initial Assessment Completed for this Encounter: Yes Disposition of Patient: Outpatient treatment Type of outpatient treatment: Adult  [facility] 11/05/2016 4:11 PM

## 2016-11-05 NOTE — H&P (Signed)
Behavioral Health Medical Screening Exam  Amy Turner is an 83 y.o. female.  Total Time spent with patient: 30 minutes  Psychiatric Specialty Exam: Physical Exam  Constitutional: She is oriented to person, place, and time. She appears well-developed and well-nourished.  HENT:  Head: Normocephalic.  Cardiovascular: Normal rate and normal heart sounds.   Respiratory: Effort normal and breath sounds normal.  GI: Soft. Bowel sounds are normal.  Musculoskeletal: Normal range of motion.  Neurological: She is alert and oriented to person, place, and time.  Skin: Skin is warm and dry.    Review of Systems  Psychiatric/Behavioral: Positive for depression. Negative for hallucinations, memory loss, substance abuse and suicidal ideas. The patient is nervous/anxious. The patient does not have insomnia.   All other systems reviewed and are negative.   Blood pressure 140/65, pulse 71, resp. rate 16, SpO2 93 %.There is no height or weight on file to calculate BMI.  General Appearance: Casual  Eye Contact:  Good  Speech:  Clear and Coherent and Normal Rate  Volume:  Normal  Mood:  Anxious and Depressed  Affect:  Congruent and Depressed  Thought Process:  Coherent and Linear  Orientation:  Full (Time, Place, and Person)  Thought Content:  Logical  Suicidal Thoughts:  No  Homicidal Thoughts:  No  Memory:  Immediate;   Good Recent;   Good Remote;   Fair  Judgement:  Good  Insight:  Good  Psychomotor Activity:  Normal  Concentration: Concentration: Good and Attention Span: Good  Recall:  Good  Fund of Knowledge:Good  Language: Good  Akathisia:  No  Handed:  Right  AIMS (if indicated):     Assets:  Communication Skills Desire for Improvement Financial Resources/Insurance Housing Resilience Social Support Transportation  Sleep:       Musculoskeletal: Strength & Muscle Tone: within normal limits Gait & Station: normal Patient leans: N/A  Blood pressure 140/65, pulse 71, resp.  rate 16, SpO2 93 %.  Recommendations:  Based on my evaluation the patient does not appear to have an emergency medical condition.  Ethelene Hal, NP 11/05/2016, 4:30 PM

## 2016-11-06 ENCOUNTER — Telehealth: Payer: Self-pay | Admitting: Family Medicine

## 2016-11-06 ENCOUNTER — Ambulatory Visit: Payer: Medicare Other | Admitting: Psychology

## 2016-11-06 NOTE — Telephone Encounter (Signed)
Patient is having anxiety, would like to speak with [facility].  Thanks,  -LL

## 2016-11-06 NOTE — Telephone Encounter (Signed)
SW pt and was given verbal okay to speak with friend Angie. Angie stated that pt was seen by behavioral health yesterday. She stated that they did not start any new medications for pts, they advised pt that it will take a little while for the zoloft to start helping but in the mean time she should contact her PCP for recommendations for sleep aid since pt is not sleeping. Angie stated that pt is already taking melatonin 5mg  2 tab at bedtime. Please advise. Thanks.

## 2016-11-09 ENCOUNTER — Other Ambulatory Visit: Payer: Self-pay | Admitting: *Deleted

## 2016-11-09 MED ORDER — HYDROXYZINE HCL 25 MG PO TABS: ORAL_TABLET | ORAL | 1 refills | Status: DC

## 2016-11-09 MED ORDER — HYDROCHLOROTHIAZIDE 25 MG PO TABS: 25.0000 mg | ORAL_TABLET | Freq: Every day | ORAL | 3 refills | Status: DC

## 2016-11-09 NOTE — Addendum Note (Signed)
Addended by: Onalee Hua on: 11/09/2016 02:32 PM   Modules accepted: Orders

## 2016-11-09 NOTE — Telephone Encounter (Signed)
Rx sent as directed below. Left message for pt to call back.

## 2016-11-09 NOTE — Telephone Encounter (Signed)
Fax from [place].  RF request for hctz LOV: 10/27/16 Next ov: 11/23/16 Last written: 10/21/15 #90 w/ 3RF

## 2016-11-09 NOTE — Telephone Encounter (Signed)
Pls eRx hydroxyzine 25mg , 1 tab po qhs prn insomnia, #30, RF x 1.-thx

## 2016-11-13 NOTE — Telephone Encounter (Signed)
Pt advised and voiced understanding.  She asked if she should continue to take the melatonin, I SW Dr. Anitra Lauth and he advised that pt can continue taking melatonin at bedtime. Pt advised and voiced understanding.

## 2016-11-23 ENCOUNTER — Encounter: Payer: Self-pay | Admitting: Family Medicine

## 2016-11-23 ENCOUNTER — Ambulatory Visit (INDEPENDENT_AMBULATORY_CARE_PROVIDER_SITE_OTHER): Payer: Medicare Other | Admitting: Family Medicine

## 2016-11-23 VITALS — BP 125/67 | HR 74 | Temp 98.4°F | Resp 16 | Ht 60.0 in | Wt 146.5 lb

## 2016-11-23 DIAGNOSIS — F324 Major depressive disorder, single episode, in partial remission: Secondary | ICD-10-CM

## 2016-11-23 DIAGNOSIS — F411 Generalized anxiety disorder: Secondary | ICD-10-CM

## 2016-11-23 DIAGNOSIS — Z634 Disappearance and death of family member: Secondary | ICD-10-CM | POA: Diagnosis not present

## 2016-11-23 DIAGNOSIS — F4329 Adjustment disorder with other symptoms: Secondary | ICD-10-CM

## 2016-11-23 DIAGNOSIS — F4321 Adjustment disorder with depressed mood: Secondary | ICD-10-CM

## 2016-11-23 NOTE — Progress Notes (Signed)
OFFICE VISIT  11/23/2016   CC:  Chief Complaint  Patient presents with  . Follow-up    Grief, Anxiety and Depression   HPI:    Patient is a 83 y.o. Caucasian female who presents for 1 mo f/u complicated grief reaction with significant anxiety and somewhat milder depression. Last visit she was going to arrange counseling and we also started 50mg  sertraline qd.  She is scheduled to start counseling in mid April: [facility], Dr. Casimiro Needle.  She has been taking the sertraline 50mg  qhs.  She is sleeping better since starting this. No known side effect. Still says she is doing bad, asks if there is any hope.  Then turns around and admits she feels better.  Rates anxiety at 5/10, doing much better.  Mood is not consistently sad, no crying spells.  Sister is here and vouches for pt's improvement.  Has been working on a quilt and this has helped a lot.  She recently worked some in her yard. She is attending church about 75% of the time.  Plans to start walking more with warmer weather coming.  Past Medical History:  Diagnosis Date  . Aortic insufficiency 09/2016   mild/mod aortic insufficiency, and mild mitral insufficiency.--Repeat echo 1 yr.  . Baker's cyst of knee 02/25/2012   LE Venous Doppler May 30, '13 - non-vascular swelling/mass left popliteal fossa c/w Baker's cyst   . CARCINOMA, BREAST, LEFT 1993   Lumpectomy, adjuvant XRT, then tamoxifen.  Gets annual mammograms.  . DEPRESSION, SITUATIONAL 03/26/2009   Qualifier: Diagnosis of  By: Linda Hedges MD, [place] 06/07/2007   Qualifier: Diagnosis of  By: Danny Lawless CMA, [place]    . HYPERLIPIDEMIA 06/07/2007   Qualifier: Diagnosis of  By: Danny Lawless CMA, [place]    . HYPERTENSION 06/07/2007   Qualifier: Diagnosis of  By: Danny Lawless CMA, [place]    . HYSTERECTOMY, HX OF 06/07/2007   Qualifier: Diagnosis of  By: Danny Lawless CMA, [place]    . Personal history of surgery to other organs 06/07/2007   Centricity Description: BLADDER REPAIR, HX OF Qualifier:  Diagnosis of  By: [place], [place]   Centricity Description: LUMPECTOMY, BREAST, HX OF Qualifier: Diagnosis of  By: [place], [place]    . POSTMENOPAUSAL OSTEOPOROSIS 11/18/2010   Qualifier: Diagnosis of  By: Linda Hedges MD, [place]uckles     Past Surgical History:  Procedure Laterality Date  . ABDOMINAL HYSTERECTOMY     Benign reasons per pt, but she can't recall exactly what dx.  Ovaries and tubes are still in.    Marland Kitchen BLADDER REPAIR    . BREAST SURGERY  1993   lumpectomy w/adjuvant XRT  . EYE SURGERY  2011   cataract surgery [facility]   . TRANSTHORACIC ECHOCARDIOGRAM  10/09/2016   EF 60-65%, normal wall motion, grd I DD, mild/mod aortic regurg, mild mitral regurg.    Outpatient Medications Prior to Visit  Medication Sig Dispense Refill  . ALPHAGAN P 0.1 % SOLN     . amLODipine (NORVASC) 10 MG tablet Take 1 tablet (10 mg total) by mouth daily. 90 tablet 3  . AZOPT 1 % ophthalmic suspension Place 1 drop into both eyes 2 (two) times daily.    . hydrochlorothiazide (HYDRODIURIL) 25 MG tablet Take 1 tablet (25 mg total) by mouth daily. 90 tablet 3  . hydrOXYzine (ATARAX/VISTARIL) 25 MG tablet Take 1 tablet at bedtime as needed for insomnia 30 tablet 1  . latanoprost (XALATAN) 0.005 % ophthalmic solution Place 1 drop  into both eyes at bedtime.    Marland Kitchen losartan (COZAAR) 100 MG tablet Take 1 tablet (100 mg total) by mouth daily. 90 tablet 3  . Melatonin 5 MG TABS Take 2 tablets by mouth at bedtime.    . metoprolol succinate (TOPROL-XL) 25 MG 24 hr tablet Take 1 tablet (25 mg total) by mouth every morning. 90 tablet 3  . Multiple Vitamins-Minerals (ICAPS AREDS FORMULA PO) Take 2 capsules by mouth 2 (two) times daily.    . sertraline (ZOLOFT) 50 MG tablet Take 1 tablet (50 mg total) by mouth at bedtime. 30 tablet 1  . timolol (TIMOPTIC) 0.5 % ophthalmic solution      No facility-administered medications prior to visit.     Allergies  Allergen Reactions  . Atorvastatin Other (See Comments)    Leg  myalgias  . Prednisone Other (See Comments)    Various cognitive/psychiatric side effects    ROS As per HPI  PE: Blood pressure 125/67, pulse 74, temperature 98.4 F (36.9 C), temperature source Oral, resp. rate 16, height 5' (1.524 m), weight 146 lb 8 oz (66.5 kg), SpO2 96 %. Gen: Alert, well appearing.  Patient is oriented to person, place, time, and situation. AFFECT: pleasant, lucid thought and speech. No further exam today.  LABS:    Chemistry      Component Value Date/Time   NA 139 09/29/2016 1127   K 4.0 09/29/2016 1127   CL 100 09/29/2016 1127   CO2 29 09/29/2016 1127   BUN 20 09/29/2016 1127   CREATININE 0.88 09/29/2016 1127   CREATININE 0.77 05/20/2016 1307      Component Value Date/Time   CALCIUM 10.1 09/29/2016 1127   ALKPHOS 90 09/29/2016 1127   AST 16 09/29/2016 1127   ALT 12 09/29/2016 1127   BILITOT 0.8 09/29/2016 1127     Lab Results  Component Value Date   WBC 10.8 (H) 09/29/2016   HGB 14.5 09/29/2016   HCT 43.0 09/29/2016   MCV 95.3 09/29/2016   PLT 277.0 09/29/2016   Lab Results  Component Value Date   TSH 3.17 09/29/2016    IMPRESSION AND PLAN:  Complicated grief response: prolonged and associated with significant anxiety and some mild low-level depression.  She is improved since last visit, which is a great step forward for her. She will start counseling in mid April, and I'll see her for f/u shortly after this. Encouraged her to continue with brain-stimulating/distracting activities like quilting, word-finding/crossword, adult coloring books, interact with people as much as possible, continue to get out for church, etc. Start walking more when weather permits. Continue 50mg  qd sertraline.  An After Visit Summary was printed and given to the patient.  FOLLOW UP: Return in about 2 months (around 01/21/2017) for f/u psych.  Signed:  Crissie Sickles, MD           11/23/2016

## 2016-11-23 NOTE — Progress Notes (Signed)
Pre visit review using our clinic review tool, if applicable. No additional management support is needed unless otherwise documented below in the visit note. 

## 2016-12-14 ENCOUNTER — Other Ambulatory Visit: Payer: Self-pay | Admitting: *Deleted

## 2016-12-14 MED ORDER — METOPROLOL SUCCINATE ER 25 MG PO TB24
25.0000 mg | ORAL_TABLET | Freq: Every morning | ORAL | 3 refills | Status: DC
Start: 2016-12-14 — End: 2018-01-02

## 2016-12-14 NOTE — Telephone Encounter (Signed)
[facility].  RF request for metoprolol LOV: 10/27/16 Next ov: 01/21/17 Last written: 09/16/15 #90 w/ 3RF

## 2016-12-15 ENCOUNTER — Other Ambulatory Visit: Payer: Self-pay | Admitting: *Deleted

## 2016-12-15 MED ORDER — SERTRALINE HCL 50 MG PO TABS: 50.0000 mg | ORAL_TABLET | Freq: Every day | ORAL | 1 refills | Status: DC

## 2016-12-15 NOTE — Telephone Encounter (Signed)
[facility]. - requesting 90 day supply  RF request for sertraline LOV: 11/23/16 Next ov: 01/21/17 Last written: 10/27/16 #30 w/ 1RF

## 2017-01-06 ENCOUNTER — Ambulatory Visit (HOSPITAL_COMMUNITY): Payer: Self-pay | Admitting: Psychiatry

## 2017-01-11 DIAGNOSIS — H401122 Primary open-angle glaucoma, left eye, moderate stage: Secondary | ICD-10-CM | POA: Diagnosis not present

## 2017-01-11 DIAGNOSIS — H401113 Primary open-angle glaucoma, right eye, severe stage: Secondary | ICD-10-CM | POA: Diagnosis not present

## 2017-01-14 ENCOUNTER — Other Ambulatory Visit: Payer: Self-pay | Admitting: *Deleted

## 2017-01-14 MED ORDER — LOSARTAN POTASSIUM 100 MG PO TABS: 100.0000 mg | ORAL_TABLET | Freq: Every day | ORAL | 1 refills | Status: DC

## 2017-01-14 NOTE — Telephone Encounter (Signed)
[facility].  RF request for losartan LOV: 10/27/16 Next ov: 01/21/17 Last written: 10/21/15 #90 w/ 3RF

## 2017-01-18 ENCOUNTER — Other Ambulatory Visit: Payer: Self-pay | Admitting: *Deleted

## 2017-01-18 MED ORDER — AMLODIPINE BESYLATE 10 MG PO TABS: 10.0000 mg | ORAL_TABLET | Freq: Every day | ORAL | 1 refills | Status: DC

## 2017-01-18 NOTE — Telephone Encounter (Signed)
[facility].  RF request for amlidipine LOV: 01/21/17 Next ov: 10/27/16 Last written: 10/21/15 #90 w/ 3RF

## 2017-01-21 ENCOUNTER — Ambulatory Visit (INDEPENDENT_AMBULATORY_CARE_PROVIDER_SITE_OTHER): Payer: Medicare Other | Admitting: Family Medicine

## 2017-01-21 ENCOUNTER — Encounter: Payer: Self-pay | Admitting: Family Medicine

## 2017-01-21 VITALS — BP 129/59 | HR 61 | Temp 98.4°F | Resp 16 | Ht 60.0 in | Wt 144.5 lb

## 2017-01-21 DIAGNOSIS — Z634 Disappearance and death of family member: Secondary | ICD-10-CM

## 2017-01-21 DIAGNOSIS — F4329 Adjustment disorder with other symptoms: Secondary | ICD-10-CM

## 2017-01-21 DIAGNOSIS — F4321 Adjustment disorder with depressed mood: Secondary | ICD-10-CM

## 2017-01-21 DIAGNOSIS — F4381 Prolonged grief disorder: Secondary | ICD-10-CM

## 2017-01-21 DIAGNOSIS — Z853 Personal history of malignant neoplasm of breast: Secondary | ICD-10-CM

## 2017-01-21 DIAGNOSIS — F419 Anxiety disorder, unspecified: Secondary | ICD-10-CM

## 2017-01-21 MED ORDER — AMLODIPINE BESYLATE 10 MG PO TABS: 10.0000 mg | ORAL_TABLET | Freq: Every day | ORAL | 1 refills | Status: DC

## 2017-01-21 NOTE — Progress Notes (Signed)
OFFICE VISIT  01/21/2017   CC:  Chief Complaint  Patient presents with  . Follow-up    Psych   HPI:    Patient is a 83 y.o. Caucasian female who presents for 2 mo f/u prolonged grief response complicated by an increase in her chronic anxiety, also some brief and intermittent periods of depressed mood. At last f/u visit she was showing some improvement with sertraline 50mg  qd.   Plans were in place to start counseling with Dr. Casimiro Needle in the middle of this month.  Her husband died about 1 year ago now.  Says she is doing pretty good.  She decided to cancel the counseling b/c she was doing better. Still quilting, is going to church every Sunday.  Appetite and sleep are good.  Her thinking is not as negative or "foggy" anymore. She has been taking sertraline 50mg  qd x 3 mo now.  Has hx of breast ca and is s/p lumpectomy on left in 1994. Has no mammogram abnormalities since that time, most recent mammogram was 2016. She does a few self breast exams per year.   Past Medical History:  Diagnosis Date  . Aortic insufficiency 09/2016   mild/mod aortic insufficiency, and mild mitral insufficiency.--Repeat echo 1 yr.  . Baker's cyst of knee 02/25/2012   LE Venous Doppler May 30, '13 - non-vascular swelling/mass left popliteal fossa c/w Baker's cyst   . CARCINOMA, BREAST, LEFT 1993   Lumpectomy, adjuvant XRT, then tamoxifen.  Gets annual mammograms.  . DEPRESSION, SITUATIONAL 03/26/2009   Qualifier: Diagnosis of  By: Linda Hedges MD, [place] 06/07/2007   Qualifier: Diagnosis of  By: Danny Lawless CMA, [place]    . HYPERLIPIDEMIA 06/07/2007   Qualifier: Diagnosis of  By: Danny Lawless CMA, [place]    . HYPERTENSION 06/07/2007   Qualifier: Diagnosis of  By: Danny Lawless CMA, [place]    . HYSTERECTOMY, HX OF 06/07/2007   Qualifier: Diagnosis of  By: Danny Lawless CMA, [place]    . Personal history of surgery to other organs 06/07/2007   Centricity Description: BLADDER REPAIR, HX OF Qualifier: Diagnosis of  By:  [place], [place]   Centricity Description: LUMPECTOMY, BREAST, HX OF Qualifier: Diagnosis of  By: [place], [place]    . POSTMENOPAUSAL OSTEOPOROSIS 11/18/2010   Qualifier: Diagnosis of  By: Linda Hedges MD, [place] Knuckles     Past Surgical History:  Procedure Laterality Date  . ABDOMINAL HYSTERECTOMY     Benign reasons per pt, but she can't recall exactly what dx.  Ovaries and tubes are still in.    Marland Kitchen BLADDER REPAIR    . BREAST SURGERY  1993   lumpectomy w/adjuvant XRT  . EYE SURGERY  2011   cataract surgery [facility]   . TRANSTHORACIC ECHOCARDIOGRAM  10/09/2016   EF 60-65%, normal wall motion, grd I DD, mild/mod aortic regurg, mild mitral regurg.    Outpatient Medications Prior to Visit  Medication Sig Dispense Refill  . ALPHAGAN P 0.1 % SOLN     . AZOPT 1 % ophthalmic suspension Place 1 drop into both eyes 2 (two) times daily.    . hydrochlorothiazide (HYDRODIURIL) 25 MG tablet Take 1 tablet (25 mg total) by mouth daily. 90 tablet 3  . hydrOXYzine (ATARAX/VISTARIL) 25 MG tablet Take 1 tablet at bedtime as needed for insomnia 30 tablet 1  . latanoprost (XALATAN) 0.005 % ophthalmic solution Place 1 drop into both eyes at bedtime.    Marland Kitchen losartan (COZAAR) 100 MG tablet Take 1 tablet (100 mg  total) by mouth daily. 90 tablet 1  . metoprolol succinate (TOPROL-XL) 25 MG 24 hr tablet Take 1 tablet (25 mg total) by mouth every morning. 90 tablet 3  . Multiple Vitamins-Minerals (ICAPS AREDS FORMULA PO) Take 2 capsules by mouth 2 (two) times daily.    . sertraline (ZOLOFT) 50 MG tablet Take 1 tablet (50 mg total) by mouth at bedtime. 90 tablet 1  . timolol (TIMOPTIC) 0.5 % ophthalmic solution     . amLODipine (NORVASC) 10 MG tablet Take 1 tablet (10 mg total) by mouth daily. 90 tablet 1  . Melatonin 5 MG TABS Take 2 tablets by mouth at bedtime.     No facility-administered medications prior to visit.     Allergies  Allergen Reactions  . Atorvastatin Other (See Comments)    Leg myalgias  .  Prednisone Other (See Comments)    Various cognitive/psychiatric side effects    ROS As per HPI  PE: Blood pressure (!) 129/59, pulse 61, temperature 98.4 F (36.9 C), temperature source Oral, resp. rate 16, height 5' (1.524 m), weight 144 lb 8 oz (65.5 kg), SpO2 94 %. Gen: Alert, well appearing.  Patient is oriented to person, place, time, and situation. AFFECT: pleasant, lucid thought and speech. No further exam today.  LABS:  Lab Results  Component Value Date   TSH 3.17 09/29/2016   Lab Results  Component Value Date   WBC 10.8 (H) 09/29/2016   HGB 14.5 09/29/2016   HCT 43.0 09/29/2016   MCV 95.3 09/29/2016   PLT 277.0 09/29/2016   Lab Results  Component Value Date   CREATININE 0.88 09/29/2016   BUN 20 09/29/2016   NA 139 09/29/2016   K 4.0 09/29/2016   CL 100 09/29/2016   CO2 29 09/29/2016   Lab Results  Component Value Date   ALT 12 09/29/2016   AST 16 09/29/2016   ALKPHOS 90 09/29/2016   BILITOT 0.8 09/29/2016   Lab Results  Component Value Date   CHOL 176 01/17/2014   Lab Results  Component Value Date   HDL 57.60 01/17/2014   Lab Results  Component Value Date   LDLCALC 91 01/17/2014   Lab Results  Component Value Date   TRIG 139.0 01/17/2014   Lab Results  Component Value Date   CHOLHDL 3 01/17/2014    IMPRESSION AND PLAN:  1) Complicated grief rxn; anxiety/dep--doing MUCH better last 2 mo. Plan is to continue sertraline 50mg  qd for at least 3 more months. Continue keeping herself busy, going to church, doing things with family/friends.  2) Hx of breast cancer in 1994, s/p lumpectomy.  No hx of abnormal mammograms since that time. Most recent mammogram was 2016, and she chooses to stop having mammograms anymore.  An After Visit Summary was printed and given to the patient.  FOLLOW UP: Return in about 3 months (around 04/22/2017) for routine chronic illness f/u/grief&anxiety.  Signed:  Crissie Sickles, MD           01/21/2017

## 2017-01-21 NOTE — Progress Notes (Signed)
Pre visit review using our clinic review tool, if applicable. No additional management support is needed unless otherwise documented below in the visit note. 

## 2017-04-02 ENCOUNTER — Telehealth: Payer: Self-pay

## 2017-04-02 NOTE — Telephone Encounter (Signed)
Spoke with patient regarding AWV, patient declines visit. Also declines future mammography screenings.

## 2017-04-16 ENCOUNTER — Encounter: Payer: Self-pay | Admitting: Family Medicine

## 2017-04-16 ENCOUNTER — Ambulatory Visit (INDEPENDENT_AMBULATORY_CARE_PROVIDER_SITE_OTHER): Payer: Medicare Other | Admitting: Family Medicine

## 2017-04-16 VITALS — BP 129/62 | HR 63 | Temp 97.8°F | Resp 16 | Ht 60.0 in | Wt 146.2 lb

## 2017-04-16 DIAGNOSIS — Z634 Disappearance and death of family member: Secondary | ICD-10-CM

## 2017-04-16 DIAGNOSIS — F4323 Adjustment disorder with mixed anxiety and depressed mood: Secondary | ICD-10-CM

## 2017-04-16 DIAGNOSIS — F4329 Adjustment disorder with other symptoms: Secondary | ICD-10-CM | POA: Diagnosis not present

## 2017-04-16 DIAGNOSIS — F4321 Adjustment disorder with depressed mood: Secondary | ICD-10-CM

## 2017-04-16 DIAGNOSIS — I10 Essential (primary) hypertension: Secondary | ICD-10-CM | POA: Diagnosis not present
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
## 2017-06-03 NOTE — Progress Notes (Signed)
Subjective:   Amy Turner is a 83 y.o. female who presents for Medicare Annual (Subsequent) preventive examination.  Review of Systems:  No ROS.  Medicare Wellness Visit. Additional risk factors are reflected in the social history.  Cardiac Risk Factors include: advanced age (>76men, >65 women);dyslipidemia;family history of premature cardiovascular disease;hypertension   Sleep patterns: Sleeps 8 hours, feels rested. Up to void x 1.  Home Safety/Smoke Alarms: Feels safe in home. Smoke alarms in place.  Living environment; residence and Firearm Safety: Lives with son in 1 story home, rails at steps.  Seat Belt Safety/Bike Helmet: Wears seat belt.   Female:   Pap-N/A       Mammo-11/07/2014, benign. Declines further testing.         Dexa scan-04/05/2011, Osteoporosis. Declines further testing.     CCS-Colonoscopy 10/08/2000, diverticulosis.      Objective:     Vitals: BP (!) 148/60 (BP Location: Left Arm, Patient Position: Sitting, Cuff Size: Normal)   Pulse (!) 56   Ht 5' (1.524 m)   Wt 146 lb 6.4 oz (66.4 kg)   SpO2 97%   BMI 28.59 kg/m   Body mass index is 28.59 kg/m.   Tobacco History  Smoking Status  . Never Smoker  Smokeless Tobacco  . Never Used     Counseling given: Not Answered   Past Medical History:  Diagnosis Date  . Aortic insufficiency 09/2016   mild/mod aortic insufficiency, and mild mitral insufficiency.--Repeat echo 1 yr.  . Baker's cyst of knee 02/25/2012   LE Venous Doppler May 30, '13 - non-vascular swelling/mass left popliteal fossa c/w Baker's cyst   . CARCINOMA, BREAST, LEFT 1993   Lumpectomy, adjuvant XRT, then tamoxifen.  Gets annual mammograms, no hx of abnormals--as of 2016, patient has chosen to stop getting screening mammograms.  . DEPRESSION, SITUATIONAL 03/26/2009   Qualifier: Diagnosis of  By: Linda Hedges MD, [place] 06/07/2007   Qualifier: Diagnosis of  By: Danny Lawless CMA, [place]    . HYPERLIPIDEMIA 06/07/2007   Qualifier:  Diagnosis of  By: Danny Lawless CMA, [place]    . HYPERTENSION 06/07/2007   Qualifier: Diagnosis of  By: Danny Lawless CMA, [place]    . HYSTERECTOMY, HX OF 06/07/2007   Qualifier: Diagnosis of  By: Danny Lawless CMA, [place]    . Personal history of surgery to other organs 06/07/2007   Centricity Description: BLADDER REPAIR, HX OF Qualifier: Diagnosis of  By: [place], [place]   Centricity Description: LUMPECTOMY, BREAST, HX OF Qualifier: Diagnosis of  By: [place], [place]    . POSTMENOPAUSAL OSTEOPOROSIS 11/18/2010   Qualifier: Diagnosis of  By: Linda Hedges MD, [place]    Past Surgical History:  Procedure Laterality Date  . ABDOMINAL HYSTERECTOMY     Benign reasons per pt, but she can't recall exactly what dx.  Ovaries and tubes are still in.    Marland Kitchen BLADDER REPAIR    . BREAST SURGERY  1993   lumpectomy w/adjuvant XRT  . EYE SURGERY  2011   cataract surgery [place]   . TRANSTHORACIC ECHOCARDIOGRAM  10/09/2016   EF 60-65%, normal wall motion, grd I DD, mild/mod aortic regurg, mild mitral regurg.   Family History  Problem Relation Age of Onset  . Hyperlipidemia Mother   . Hypertension Mother   . Cervical cancer Mother   . Coronary artery disease Mother   . Depression Father   . Colon cancer Neg Hx   . Diabetes Neg Hx    History  Sexual Activity  . Sexual activity: No    Outpatient Encounter Prescriptions as of 06/04/2017  Medication Sig  . ALPHAGAN P 0.1 % SOLN   . amLODipine (NORVASC) 10 MG tablet Take 1 tablet (10 mg total) by mouth daily.  . AZOPT 1 % ophthalmic suspension Place 1 drop into both eyes 2 (two) times daily.  . hydrochlorothiazide (HYDRODIURIL) 25 MG tablet Take 1 tablet (25 mg total) by mouth daily.  Marland Kitchen latanoprost (XALATAN) 0.005 % ophthalmic solution Place 1 drop into both eyes at bedtime.  Marland Kitchen losartan (COZAAR) 100 MG tablet Take 1 tablet (100 mg total) by mouth daily.  . metoprolol succinate (TOPROL-XL) 25 MG 24 hr tablet Take 1 tablet (25 mg total) by mouth every morning.  . Multiple  Vitamins-Minerals (ICAPS AREDS FORMULA PO) Take 1 capsule by mouth 2 (two) times daily.   . sertraline (ZOLOFT) 50 MG tablet Take 1 tablet (50 mg total) by mouth at bedtime.  . timolol (TIMOPTIC) 0.5 % ophthalmic solution   . hydrOXYzine (ATARAX/VISTARIL) 25 MG tablet Take 1 tablet at bedtime as needed for insomnia (Patient not taking: Reported on 06/04/2017)   No facility-administered encounter medications on file as of 06/04/2017.     Activities of Daily Living In your present state of health, do you have any difficulty performing the following activities: 06/04/2017  Hearing? N  Vision? N  Difficulty concentrating or making decisions? N  Walking or climbing stairs? N  Dressing or bathing? N  Doing errands, shopping? N  Preparing Food and eating ? N  Using the Toilet? N  In the past six months, have you accidently leaked urine? N  Do you have problems with loss of bowel control? N  Managing your Medications? N  Managing your Finances? N  Housekeeping or managing your Housekeeping? N  Some encounter information is confidential and restricted. Go to Review Flowsheets activity to see all data.  Some recent data might be hidden    Patient Care Team: Tammi Sou, MD as PCP - General (Family Medicine) Marygrace Drought, MD as Consulting Physician (Ophthalmology) Bridgett Larsson, DDS (Dental General Practice)    Assessment:    Physical assessment deferred to PCP.  Exercise Activities and Dietary recommendations Current Exercise Habits: The patient does not participate in regular exercise at present [facility]), Exercise limited by: None identified   Diet (meal preparation, eat out, water intake, caffeinated beverages, dairy products, fruits and vegetables): Drinks water, coffee and tea.   Breakfast: eggs, grits, fruit, toast/jelly Lunch: cottage cheese/fruit Dinner: protein and vegetables   Goals      Patient Stated   . patient (pt-stated)          Maintain current health        Fall Risk Fall Risk  06/04/2017 10/21/2015 04/25/2015  Falls in the past year? No No No   Depression Screen PHQ 2/9 Scores 06/04/2017 04/16/2017 10/21/2015 04/25/2015  PHQ - 2 Score 0 0 0 0  PHQ- 9 Score - 1 - -     Cognitive Function MMSE - Mini Mental State Exam 06/04/2017  Orientation to time 5  Orientation to Place 5  Registration 3  Attention/ Calculation 5  Recall 2  Language- name 2 objects 2  Language- repeat 1  Language- follow 3 step command 3  Language- read & follow direction 1  Write a sentence 1  Copy design 1  Total score 29        Immunization History  Administered Date(s) Administered  .  Influenza Whole 06/25/2008, 07/29/2009  . Influenza, High Dose Seasonal PF 08/14/2013, 08/19/2016  . Influenza,inj,Quad PF,6+ Mos 06/25/2014  . Influenza-Unspecified 07/09/2015  . Pneumococcal Conjugate-13 01/31/2015  . Pneumococcal Polysaccharide-23 08/28/2004  . Td 11/14/2010   Screening Tests Health Maintenance  Topic Date Due  . INFLUENZA VACCINE  04/28/2017  . MAMMOGRAM  04/02/2018 (Originally 11/08/2015)  . TETANUS/TDAP  11/14/2020  . DEXA SCAN  Completed  . PNA vac Low Risk Adult  Completed   Declines mammogram and DEXA screening. Declines Shingrix. Plans to get flu vaccine at local pharmacy in October.      Plan:     Get your flu shot at pharmacy in October.   Bring a copy of your living will and/or healthcare power of attorney to your next office visit.  Continue doing brain stimulating activities (puzzles, reading, adult coloring books, staying active) to keep memory sharp.     I have personally reviewed and noted the following in the patient's chart:   . Medical and social history . Use of alcohol, tobacco or illicit drugs  . Current medications and supplements . Functional ability and status . Nutritional status . Physical activity . Advanced directives . List of other physicians . Hospitalizations, surgeries, and ER visits in previous 12  months . Vitals . Screenings to include cognitive, depression, and falls . Referrals and appointments  In addition, I have reviewed and discussed with patient certain preventive protocols, quality metrics, and best practice recommendations. A written personalized care plan for preventive services as well as general preventive health recommendations were provided to patient.     Gerilyn Nestle, RN  06/04/2017

## 2017-06-04 ENCOUNTER — Ambulatory Visit: Payer: Medicare Other

## 2017-06-04 VITALS — BP 148/60 | HR 56 | Ht 60.0 in | Wt 146.4 lb

## 2017-06-04 DIAGNOSIS — Z0000 Encounter for general adult medical examination without abnormal findings: Secondary | ICD-10-CM

## 2017-06-04 NOTE — Patient Instructions (Addendum)
Get your flu shot at pharmacy in October.   Bring a copy of your living will and/or healthcare power of attorney to your next office visit.  Continue doing brain stimulating activities (puzzles, reading, adult coloring books, staying active) to keep memory sharp.    Fall Prevention in the Home Falls can cause injuries. They can happen to people of all ages. There are many things you can do to make your home safe and to help prevent falls. What can I do on the outside of my home?  Regularly fix the edges of walkways and driveways and fix any cracks.  Remove anything that might make you trip as you walk through a door, such as a raised step or threshold.  Trim any bushes or trees on the path to your home.  Use bright outdoor lighting.  Clear any walking paths of anything that might make someone trip, such as rocks or tools.  Regularly check to see if handrails are loose or broken. Make sure that both sides of any steps have handrails.  Any raised decks and porches should have guardrails on the edges.  Have any leaves, snow, or ice cleared regularly.  Use sand or salt on walking paths during winter.  Clean up any spills in your garage right away. This includes oil or grease spills. What can I do in the bathroom?  Use night lights.  Install grab bars by the toilet and in the tub and shower. Do not use towel bars as grab bars.  Use non-skid mats or decals in the tub or shower.  If you need to sit down in the shower, use a plastic, non-slip stool.  Keep the floor dry. Clean up any water that spills on the floor as soon as it happens.  Remove soap buildup in the tub or shower regularly.  Attach bath mats securely with double-sided non-slip rug tape.  Do not have throw rugs and other things on the floor that can make you trip. What can I do in the bedroom?  Use night lights.  Make sure that you have a light by your bed that is easy to reach.  Do not use any sheets or  blankets that are too big for your bed. They should not hang down onto the floor.  Have a firm chair that has side arms. You can use this for support while you get dressed.  Do not have throw rugs and other things on the floor that can make you trip. What can I do in the kitchen?  Clean up any spills right away.  Avoid walking on wet floors.  Keep items that you use a lot in easy-to-reach places.  If you need to reach something above you, use a strong step stool that has a grab bar.  Keep electrical cords out of the way.  Do not use floor polish or wax that makes floors slippery. If you must use wax, use non-skid floor wax.  Do not have throw rugs and other things on the floor that can make you trip. What can I do with my stairs?  Do not leave any items on the stairs.  Make sure that there are handrails on both sides of the stairs and use them. Fix handrails that are broken or loose. Make sure that handrails are as long as the stairways.  Check any carpeting to make sure that it is firmly attached to the stairs. Fix any carpet that is loose or worn.  Avoid having throw  rugs at the top or bottom of the stairs. If you do have throw rugs, attach them to the floor with carpet tape.  Make sure that you have a light switch at the top of the stairs and the bottom of the stairs. If you do not have them, ask someone to add them for you. What else can I do to help prevent falls?  Wear shoes that: ? Do not have high heels. ? Have rubber bottoms. ? Are comfortable and fit you well. ? Are closed at the toe. Do not wear sandals.  If you use a stepladder: ? Make sure that it is fully opened. Do not climb a closed stepladder. ? Make sure that both sides of the stepladder are locked into place. ? Ask someone to hold it for you, if possible.  Clearly mark and make sure that you can see: ? Any grab bars or handrails. ? First and last steps. ? Where the edge of each step is.  Use tools  that help you move around (mobility aids) if they are needed. These include: ? Canes. ? Walkers. ? Scooters. ? Crutches.  Turn on the lights when you go into a dark area. Replace any light bulbs as soon as they burn out.  Set up your furniture so you have a clear path. Avoid moving your furniture around.  If any of your floors are uneven, fix them.  If there are any pets around you, be aware of where they are.  Review your medicines with your doctor. Some medicines can make you feel dizzy. This can increase your chance of falling. Ask your doctor what other things that you can do to help prevent falls. This information is not intended to replace advice given to you by your health care provider. Make sure you discuss any questions you have with your health care provider. Document Released: 07/11/2009 Document Revised: 02/20/2016 Document Reviewed: 10/19/2014 Elsevier Interactive Patient Education  2018 [place] Maintenance, Female Adopting a healthy lifestyle and getting preventive care can go a long way to promote health and wellness. Talk with your health care provider about what schedule of regular examinations is right for you. This is a good chance for you to check in with your provider about disease prevention and staying healthy. In between checkups, there are plenty of things you can do on your own. Experts have done a lot of research about which lifestyle changes and preventive measures are most likely to keep you healthy. Ask your health care provider for more information. Weight and diet Eat a healthy diet  Be sure to include plenty of vegetables, fruits, low-fat dairy products, and lean protein.  Do not eat a lot of foods high in solid fats, added sugars, or salt.  Get regular exercise. This is one of the most important things you can do for your health. ? Most adults should exercise for at least 150 minutes each week. The exercise should increase your heart  rate and make you sweat (moderate-intensity exercise). ? Most adults should also do strengthening exercises at least twice a week. This is in addition to the moderate-intensity exercise.  Maintain a healthy weight  Body mass index (BMI) is a measurement that can be used to identify possible weight problems. It estimates body fat based on height and weight. Your health care provider can help determine your BMI and help you achieve or maintain a healthy weight.  For females 64 years of age and older: ? A  BMI below 18.5 is considered underweight. ? A BMI of 18.5 to 24.9 is normal. ? A BMI of 25 to 29.9 is considered overweight. ? A BMI of 30 and above is considered obese.  Watch levels of cholesterol and blood lipids  You should start having your blood tested for lipids and cholesterol at 81 years of age, then have this test every 5 years.  You may need to have your cholesterol levels checked more often if: ? Your lipid or cholesterol levels are high. ? You are older than 81 years of age. ? You are at high risk for heart disease.  Cancer screening Lung Cancer  Lung cancer screening is recommended for adults 6-75 years old who are at high risk for lung cancer because of a history of smoking.  A yearly low-dose CT scan of the lungs is recommended for people who: ? Currently smoke. ? Have quit within the past 15 years. ? Have at least a 30-pack-year history of smoking. A pack year is smoking an average of one pack of cigarettes a day for 1 year.  Yearly screening should continue until it has been 15 years since you quit.  Yearly screening should stop if you develop a health problem that would prevent you from having lung cancer treatment.  Breast Cancer  Practice breast self-awareness. This means understanding how your breasts normally appear and feel.  It also means doing regular breast self-exams. Let your health care provider know about any changes, no matter how small.  If  you are in your 20s or 30s, you should have a clinical breast exam (CBE) by a health care provider every 1-3 years as part of a regular health exam.  If you are 21 or older, have a CBE every year. Also consider having a breast X-ray (mammogram) every year.  If you have a family history of breast cancer, talk to your health care provider about genetic screening.  If you are at high risk for breast cancer, talk to your health care provider about having an MRI and a mammogram every year.  Breast cancer gene (BRCA) assessment is recommended for women who have family members with BRCA-related cancers. BRCA-related cancers include: ? Breast. ? Ovarian. ? Tubal. ? Peritoneal cancers.  Results of the assessment will determine the need for genetic counseling and BRCA1 and BRCA2 testing.  Cervical Cancer Your health care provider may recommend that you be screened regularly for cancer of the pelvic organs (ovaries, uterus, and vagina). This screening involves a pelvic examination, including checking for microscopic changes to the surface of your cervix (Pap test). You may be encouraged to have this screening done every 3 years, beginning at age 80.  For women ages 75-65, health care providers may recommend pelvic exams and Pap testing every 3 years, or they may recommend the Pap and pelvic exam, combined with testing for human papilloma virus (HPV), every 5 years. Some types of HPV increase your risk of cervical cancer. Testing for HPV may also be done on women of any age with unclear Pap test results.  Other health care providers may not recommend any screening for nonpregnant women who are considered low risk for pelvic cancer and who do not have symptoms. Ask your health care provider if a screening pelvic exam is right for you.  If you have had past treatment for cervical cancer or a condition that could lead to cancer, you need Pap tests and screening for cancer for at least 20 years after  your  treatment. If Pap tests have been discontinued, your risk factors (such as having a new sexual partner) need to be reassessed to determine if screening should resume. Some women have medical problems that increase the chance of getting cervical cancer. In these cases, your health care provider may recommend more frequent screening and Pap tests.  Colorectal Cancer  This type of cancer can be detected and often prevented.  Routine colorectal cancer screening usually begins at 81 years of age and continues through 81 years of age.  Your health care provider may recommend screening at an earlier age if you have risk factors for colon cancer.  Your health care provider may also recommend using home test kits to check for hidden blood in the stool.  A small camera at the end of a tube can be used to examine your colon directly (sigmoidoscopy or colonoscopy). This is done to check for the earliest forms of colorectal cancer.  Routine screening usually begins at age 80.  Direct examination of the colon should be repeated every 5-10 years through 81 years of age. However, you may need to be screened more often if early forms of precancerous polyps or small growths are found.  Skin Cancer  Check your skin from head to toe regularly.  Tell your health care provider about any new moles or changes in moles, especially if there is a change in a mole's shape or color.  Also tell your health care provider if you have a mole that is larger than the size of a pencil eraser.  Always use sunscreen. Apply sunscreen liberally and repeatedly throughout the day.  Protect yourself by wearing long sleeves, pants, a wide-brimmed hat, and sunglasses whenever you are outside.  Heart disease, diabetes, and high blood pressure  High blood pressure causes heart disease and increases the risk of stroke. High blood pressure is more likely to develop in: ? People who have blood pressure in the high end of the normal  range (130-139/85-89 mm Hg). ? People who are overweight or obese. ? People who are African American.  If you are 92-88 years of age, have your blood pressure checked every 3-5 years. If you are 63 years of age or older, have your blood pressure checked every year. You should have your blood pressure measured twice-once when you are at a hospital or clinic, and once when you are not at a hospital or clinic. Record the average of the two measurements. To check your blood pressure when you are not at a hospital or clinic, you can use: ? An automated blood pressure machine at a pharmacy. ? A home blood pressure monitor.  If you are between 45 years and 75 years old, ask your health care provider if you should take aspirin to prevent strokes.  Have regular diabetes screenings. This involves taking a blood sample to check your fasting blood sugar level. ? If you are at a normal weight and have a low risk for diabetes, have this test once every three years after 81 years of age. ? If you are overweight and have a high risk for diabetes, consider being tested at a younger age or more often. Preventing infection Hepatitis B  If you have a higher risk for hepatitis B, you should be screened for this virus. You are considered at high risk for hepatitis B if: ? You were born in a country where hepatitis B is common. Ask your health care provider which countries are considered high  risk. ? Your parents were born in a high-risk country, and you have not been immunized against hepatitis B (hepatitis B vaccine). ? You have HIV or AIDS. ? You use needles to inject street drugs. ? You live with someone who has hepatitis B. ? You have had sex with someone who has hepatitis B. ? You get hemodialysis treatment. ? You take certain medicines for conditions, including cancer, organ transplantation, and autoimmune conditions.  Hepatitis C  Blood testing is recommended for: ? Everyone born from [birth year] through  1963. ? Anyone with known risk factors for hepatitis C.  Sexually transmitted infections (STIs)  You should be screened for sexually transmitted infections (STIs) including gonorrhea and chlamydia if: ? You are sexually active and are younger than 81 years of age. ? You are older than 81 years of age and your health care provider tells you that you are at risk for this type of infection. ? Your sexual activity has changed since you were last screened and you are at an increased risk for chlamydia or gonorrhea. Ask your health care provider if you are at risk.  If you do not have HIV, but are at risk, it may be recommended that you take a prescription medicine daily to prevent HIV infection. This is called pre-exposure prophylaxis (PrEP). You are considered at risk if: ? You are sexually active and do not regularly use condoms or know the HIV status of your partner(s). ? You take drugs by injection. ? You are sexually active with a partner who has HIV.  Talk with your health care provider about whether you are at high risk of being infected with HIV. If you choose to begin PrEP, you should first be tested for HIV. You should then be tested every 3 months for as long as you are taking PrEP. Pregnancy  If you are premenopausal and you may become pregnant, ask your health care provider about preconception counseling.  If you may become pregnant, take 400 to 800 micrograms (mcg) of folic acid every day.  If you want to prevent pregnancy, talk to your health care provider about birth control (contraception). Osteoporosis and menopause  Osteoporosis is a disease in which the bones lose minerals and strength with aging. This can result in serious bone fractures. Your risk for osteoporosis can be identified using a bone density scan.  If you are 47 years of age or older, or if you are at risk for osteoporosis and fractures, ask your health care provider if you should be screened.  Ask your health  care provider whether you should take a calcium or vitamin D supplement to lower your risk for osteoporosis.  Menopause may have certain physical symptoms and risks.  Hormone replacement therapy may reduce some of these symptoms and risks. Talk to your health care provider about whether hormone replacement therapy is right for you. Follow these instructions at home:  Schedule regular health, dental, and eye exams.  Stay current with your immunizations.  Do not use any tobacco products including cigarettes, chewing tobacco, or electronic cigarettes.  If you are pregnant, do not drink alcohol.  If you are breastfeeding, limit how much and how often you drink alcohol.  Limit alcohol intake to no more than 1 drink per day for nonpregnant women. One drink equals 12 ounces of beer, 5 ounces of wine, or 1 ounces of hard liquor.  Do not use street drugs.  Do not share needles.  Ask your health care provider for help  if you need support or information about quitting drugs.  Tell your health care provider if you often feel depressed.  Tell your health care provider if you have ever been abused or do not feel safe at home. This information is not intended to replace advice given to you by your health care provider. Make sure you discuss any questions you have with your health care provider. Document Released: 03/30/2011 Document Revised: 02/20/2016 Document Reviewed: 06/18/2015 Elsevier Interactive Patient Education  [employer].

## 2017-06-06 NOTE — Progress Notes (Signed)
AWV reviewed and agree.  Signed:  Crissie Sickles, MD           06/06/2017

## 2017-06-27 ENCOUNTER — Other Ambulatory Visit: Payer: Self-pay | Admitting: Family Medicine

## 2017-07-09 DIAGNOSIS — Z23 Encounter for immunization: Secondary | ICD-10-CM | POA: Diagnosis not present

## 2017-07-12 ENCOUNTER — Other Ambulatory Visit: Payer: Self-pay | Admitting: Family Medicine

## 2017-08-06 DIAGNOSIS — H43813 Vitreous degeneration, bilateral: Secondary | ICD-10-CM | POA: Diagnosis not present

## 2017-08-06 DIAGNOSIS — H401122 Primary open-angle glaucoma, left eye, moderate stage: Secondary | ICD-10-CM | POA: Diagnosis not present

## 2017-08-06 DIAGNOSIS — H52203 Unspecified astigmatism, bilateral: Secondary | ICD-10-CM | POA: Diagnosis not present

## 2017-08-06 DIAGNOSIS — H401113 Primary open-angle glaucoma, right eye, severe stage: Secondary | ICD-10-CM | POA: Diagnosis not present

## 2017-10-15 ENCOUNTER — Encounter: Payer: Self-pay | Admitting: Family Medicine

## 2017-10-15 ENCOUNTER — Ambulatory Visit (INDEPENDENT_AMBULATORY_CARE_PROVIDER_SITE_OTHER): Payer: Medicare Other | Admitting: Family Medicine

## 2017-10-15 VITALS — BP 148/70 | HR 71 | Temp 98.2°F | Resp 16 | Ht 60.0 in | Wt 146.5 lb

## 2017-10-15 DIAGNOSIS — F4321 Adjustment disorder with depressed mood: Secondary | ICD-10-CM

## 2017-10-15 DIAGNOSIS — Z634 Disappearance and death of family member: Secondary | ICD-10-CM

## 2017-10-15 DIAGNOSIS — F419 Anxiety disorder, unspecified: Secondary | ICD-10-CM

## 2017-10-15 DIAGNOSIS — H6123 Impacted cerumen, bilateral: Secondary | ICD-10-CM

## 2017-10-15 DIAGNOSIS — I10 Essential (primary) hypertension: Secondary | ICD-10-CM

## 2017-10-15 DIAGNOSIS — F4329 Adjustment disorder with other symptoms: Secondary | ICD-10-CM | POA: Diagnosis not present

## 2017-10-15 NOTE — Progress Notes (Signed)
OFFICE VISIT  10/15/2017   CC:  Chief Complaint  Patient presents with  . Follow-up    RCI, pt is not fasting.     HPI:    Patient is a 84 y.o. Caucasian female who presents for 4 mo f/u anxiety and complicated grief response, HTN,  She was improved and we kept her on sertraline 50mg  qd at that time.  She has a hx of mild/mod aortic insufficiency and mild mitral insufficiency, last echo was 09/2016 and the recommendation was to repeat this in 1 yr.  She denies dizziness, SOB, CP, or syncope.  No palpitations.  Getting over a URI lately.  She feels like she is doing well right now regarding her anxiety, grief.  Still interactive with people, going to church, quilting at home. She has decided to not renew her driver's license so she is no longer driving.  She donated her cars to children's home. She feels good about this decision b/c she doesn't want to worry her family.  No signif anhedonia, no persistent feeling of depressed/sadness, sleep is fine.  Appetite is good.  Concentration is fine. No irritability.  HTN: no home monitoring.  Takes her meds as rx'd.  Says ears feel clogged, says she often has cerumen impactions and requires irrigation.  She asks me to take a look today.    Past Medical History:  Diagnosis Date  . Aortic insufficiency 09/2016   mild/mod aortic insufficiency, and mild mitral insufficiency.--Repeat echo 1 yr.  . Baker's cyst of knee 02/25/2012   LE Venous Doppler May 30, '13 - non-vascular swelling/mass left popliteal fossa c/w Baker's cyst   . CARCINOMA, BREAST, LEFT 1993   Lumpectomy, adjuvant XRT, then tamoxifen.  Gets annual mammograms, no hx of abnormals--as of 2016, patient has chosen to stop getting screening mammograms.  . DEPRESSION, SITUATIONAL 03/26/2009   Qualifier: Diagnosis of  By: Linda Hedges MD, [place] 06/07/2007   Qualifier: Diagnosis of  By: Danny Lawless CMA, [place]    . HYPERLIPIDEMIA 06/07/2007   Qualifier: Diagnosis of  By:  Danny Lawless CMA, [place]    . HYPERTENSION 06/07/2007   Qualifier: Diagnosis of  By: Danny Lawless CMA, [place]    . HYSTERECTOMY, HX OF 06/07/2007   Qualifier: Diagnosis of  By: Danny Lawless CMA, [place]    . Personal history of surgery to other organs 06/07/2007   Centricity Description: BLADDER REPAIR, HX OF Qualifier: Diagnosis of  By: [place], [place]   Centricity Description: LUMPECTOMY, BREAST, HX OF Qualifier: Diagnosis of  By: [place], [place]    . POSTMENOPAUSAL OSTEOPOROSIS 11/18/2010   Qualifier: Diagnosis of  By: Linda Hedges MD, [place]     Past Surgical History:  Procedure Laterality Date  . ABDOMINAL HYSTERECTOMY     Benign reasons per pt, but she can't recall exactly what dx.  Ovaries and tubes are still in.    Marland Kitchen BLADDER REPAIR    . BREAST SURGERY  1993   lumpectomy w/adjuvant XRT  . EYE SURGERY  2011   cataract surgery [facility]   . TRANSTHORACIC ECHOCARDIOGRAM  10/09/2016   EF 60-65%, normal wall motion, grd I DD, mild/mod aortic regurg, mild mitral regurg.    Outpatient Medications Prior to Visit  Medication Sig Dispense Refill  . ALPHAGAN P 0.1 % SOLN     . amLODipine (NORVASC) 10 MG tablet Take 1 tablet (10 mg total) by mouth daily. 90 tablet 1  . AZOPT 1 % ophthalmic suspension Place 1 drop into both eyes  2 (two) times daily.    . hydrochlorothiazide (HYDRODIURIL) 25 MG tablet Take 1 tablet (25 mg total) by mouth daily. 90 tablet 3  . hydrOXYzine (ATARAX/VISTARIL) 25 MG tablet Take 1 tablet at bedtime as needed for insomnia 30 tablet 1  . latanoprost (XALATAN) 0.005 % ophthalmic solution Place 1 drop into both eyes at bedtime.    Marland Kitchen losartan (COZAAR) 100 MG tablet TAKE 1 TABLET (100 MG TOTAL) BY MOUTH DAILY. 90 tablet 1  . metoprolol succinate (TOPROL-XL) 25 MG 24 hr tablet Take 1 tablet (25 mg total) by mouth every morning. 90 tablet 3  . Multiple Vitamins-Minerals (ICAPS AREDS FORMULA PO) Take 1 capsule by mouth 2 (two) times daily.     . sertraline (ZOLOFT) 50 MG tablet TAKE 1  TABLET BY MOUTH AT BEDTIME 90 tablet 1  . timolol (TIMOPTIC) 0.5 % ophthalmic solution      No facility-administered medications prior to visit.     Allergies  Allergen Reactions  . Atorvastatin Other (See Comments)    Leg myalgias  . Prednisone Other (See Comments)    Various cognitive/psychiatric side effects    ROS As per HPI  PE: Blood pressure (!) 148/70, pulse 71, temperature 98.2 F (36.8 C), temperature source Oral, resp. rate 16, height 5' (1.524 m), weight 146 lb 8 oz (66.5 kg), SpO2 97 %. Body mass index is 28.61 kg/m.  Gen: Alert, well appearing.  Patient is oriented to person, place, time, and situation. AFFECT: pleasant, lucid thought and speech. CV: RRR, no m/r/g.   LUNGS: CTA bilat, nonlabored resps, good aeration in all lung fields. EXT: no clubbing, cyanosis, or edema.  EARS: left EAC with 100% cerumen impaction. Right EAC with 80-90% cerumen impaction---I was able to extract this w/out problem with my ear curette today. TM normal.  LABS:  Lab Results  Component Value Date   TSH 3.17 09/29/2016   Lab Results  Component Value Date   WBC 10.8 (H) 09/29/2016   HGB 14.5 09/29/2016   HCT 43.0 09/29/2016   MCV 95.3 09/29/2016   PLT 277.0 09/29/2016   Lab Results  Component Value Date   CREATININE 0.88 09/29/2016   BUN 20 09/29/2016   NA 139 09/29/2016   K 4.0 09/29/2016   CL 100 09/29/2016   CO2 29 09/29/2016   Lab Results  Component Value Date   ALT 12 09/29/2016   AST 16 09/29/2016   ALKPHOS 90 09/29/2016   BILITOT 0.8 09/29/2016   Lab Results  Component Value Date   CHOL 176 01/17/2014   Lab Results  Component Value Date   HDL 57.60 01/17/2014   Lab Results  Component Value Date   LDLCALC 91 01/17/2014   Lab Results  Component Value Date   TRIG 139.0 01/17/2014   Lab Results  Component Value Date   CHOLHDL 3 01/17/2014    IMPRESSION AND PLAN:  1) GAD with complicated grief over the last year or so. Much improved and  stable--in remission essentially. We'll keep her on sertraline 50mg  qhs, talk at each visit about possibly weening off this, but taking it indefinitely is definitely fine.  2) HTN: The current medical regimen is effective;  continue present plan and medications. BMET today.  3) Bilat cerumen impaction: R EAC cleared by myself.  Nurse irrigated L EAC completely clear, TM normal.  4) Hx of mild/mod aortic insufficiency and mild mitral insufficiency.  Pt is asymptomatic. She does not want to repeat echo as we had planned.  Spent 25 min with pt today, with >50% of this time spent in counseling and care coordination regarding the above problems.  An After Visit Summary was printed and given to the patient.  FOLLOW UP: Return in about 6 months (around 04/14/2018) for routine chronic illness f/u.  Signed:  Crissie Sickles, MD           10/15/2017

## 2017-10-16 LAB — BASIC METABOLIC PANEL
BUN: 17 mg/dL (ref 7–25)
CO2: 29 mmol/L (ref 20–32)
Calcium: 9.4 mg/dL (ref 8.6–10.4)
Chloride: 101 mmol/L (ref 98–110)
Creat: 0.86 mg/dL (ref 0.60–0.88)
GLUCOSE: 104 mg/dL — AB (ref 65–99)
Potassium: 4.5 mmol/L (ref 3.5–5.3)
Sodium: 138 mmol/L (ref 135–146)

## 2017-11-03 ENCOUNTER — Ambulatory Visit (INDEPENDENT_AMBULATORY_CARE_PROVIDER_SITE_OTHER): Payer: Medicare Other | Admitting: Family Medicine

## 2017-11-03 ENCOUNTER — Encounter: Payer: Self-pay | Admitting: Family Medicine

## 2017-11-03 VITALS — BP 160/73 | HR 71 | Temp 98.1°F | Resp 15 | Wt 145.0 lb

## 2017-11-03 DIAGNOSIS — J069 Acute upper respiratory infection, unspecified: Secondary | ICD-10-CM

## 2017-11-03 DIAGNOSIS — B9789 Other viral agents as the cause of diseases classified elsewhere: Secondary | ICD-10-CM | POA: Diagnosis not present

## 2017-11-03 NOTE — Patient Instructions (Signed)
Get otc generic robitussin DM OR Mucinex DM and use as directed on the packaging for cough and congestion. Use otc generic saline nasal spray 2-3 times per day to irrigate/moisturize your nasal passages.   

## 2017-11-03 NOTE — Progress Notes (Signed)
OFFICE VISIT  11/03/2017   CC:  Chief Complaint  Patient presents with  . Fever    fever yesterday, fatigue, cough   HPI:    Patient is a 84 y.o. Caucasian female who presents for respiratory complaints. Felt mildly ill for 2-3 days, stuffy cough and cough came yesterday.  Felt subjective fever yesterday but has not today.  Took ibuprofen.  No ST, no SOB, no wheezing, no chest tightness.  Mild HA. No body aches.  Little bit of loose BMs.  No n/v.  Past Medical History:  Diagnosis Date  . Aortic insufficiency 09/2016   mild/mod aortic insufficiency, and mild mitral insufficiency.--Repeat echo 1 yr.  . Baker's cyst of knee 02/25/2012   LE Venous Doppler May 30, '13 - non-vascular swelling/mass left popliteal fossa c/w Baker's cyst   . CARCINOMA, BREAST, LEFT 1993   Lumpectomy, adjuvant XRT, then tamoxifen.  Gets annual mammograms, no hx of abnormals--as of 2016, patient has chosen to stop getting screening mammograms.  . DEPRESSION, SITUATIONAL 03/26/2009   Qualifier: Diagnosis of  By: Linda Hedges MD, [place] 06/07/2007   Qualifier: Diagnosis of  By: Danny Lawless CMA, [place]    . HYPERLIPIDEMIA 06/07/2007   Qualifier: Diagnosis of  By: Danny Lawless CMA, [place]    . HYPERTENSION 06/07/2007   Qualifier: Diagnosis of  By: Danny Lawless CMA, [place]    . HYSTERECTOMY, HX OF 06/07/2007   Qualifier: Diagnosis of  By: Danny Lawless CMA, [place]    . Personal history of surgery to other organs 06/07/2007   Centricity Description: BLADDER REPAIR, HX OF Qualifier: Diagnosis of  By: [place], [place]   Centricity Description: LUMPECTOMY, BREAST, HX OF Qualifier: Diagnosis of  By: [place], [place]    . POSTMENOPAUSAL OSTEOPOROSIS 11/18/2010   Qualifier: Diagnosis of  By: Linda Hedges MD, [place] Knuckles     Past Surgical History:  Procedure Laterality Date  . ABDOMINAL HYSTERECTOMY     Benign reasons per pt, but she can't recall exactly what dx.  Ovaries and tubes are still in.    Marland Kitchen BLADDER REPAIR    . BREAST SURGERY   1993   lumpectomy w/adjuvant XRT  . EYE SURGERY  2011   cataract surgery [facility]   . TRANSTHORACIC ECHOCARDIOGRAM  10/09/2016   EF 60-65%, normal wall motion, grd I DD, mild/mod aortic regurg, mild mitral regurg.    Outpatient Medications Prior to Visit  Medication Sig Dispense Refill  . ALPHAGAN P 0.1 % SOLN     . amLODipine (NORVASC) 10 MG tablet Take 1 tablet (10 mg total) by mouth daily. 90 tablet 1  . AZOPT 1 % ophthalmic suspension Place 1 drop into both eyes 2 (two) times daily.    . hydrochlorothiazide (HYDRODIURIL) 25 MG tablet Take 1 tablet (25 mg total) by mouth daily. 90 tablet 3  . hydrOXYzine (ATARAX/VISTARIL) 25 MG tablet Take 1 tablet at bedtime as needed for insomnia 30 tablet 1  . latanoprost (XALATAN) 0.005 % ophthalmic solution Place 1 drop into both eyes at bedtime.    Marland Kitchen losartan (COZAAR) 100 MG tablet TAKE 1 TABLET (100 MG TOTAL) BY MOUTH DAILY. 90 tablet 1  . metoprolol succinate (TOPROL-XL) 25 MG 24 hr tablet Take 1 tablet (25 mg total) by mouth every morning. 90 tablet 3  . Multiple Vitamins-Minerals (ICAPS AREDS FORMULA PO) Take 1 capsule by mouth 2 (two) times daily.     . sertraline (ZOLOFT) 50 MG tablet TAKE 1 TABLET BY MOUTH AT BEDTIME 90 tablet  1  . timolol (TIMOPTIC) 0.5 % ophthalmic solution      No facility-administered medications prior to visit.     Allergies  Allergen Reactions  . Atorvastatin Other (See Comments)    Leg myalgias  . Prednisone Other (See Comments)    Various cognitive/psychiatric side effects    ROS As per HPI  PE: Blood pressure (!) 160/73, pulse 71, temperature 98.1 F (36.7 C), temperature source Oral, resp. rate 15, weight 145 lb (65.8 kg), SpO2 95 %. VS: noted--normal except bp up some. Gen: alert, NAD, NONTOXIC APPEARING.  Mildly anxious as per her normal. HEENT: eyes without injection, drainage, or swelling.  Ears: EACs clear, TMs with normal light reflex and landmarks.  Nose: Clear rhinorrhea, with some dried,  crusty exudate adherent to mildly injected mucosa.  No purulent d/c.  No paranasal sinus TTP.  No facial swelling.  Throat and mouth without focal lesion.  No pharyngial swelling, erythema, or exudate.   Neck: supple, no LAD.   LUNGS: CTA bilat, nonlabored resps.   CV: RRR, 1/6 systolic murmur, no r/g. EXT: no c/c/e SKIN: no rash  LABS:    Chemistry      Component Value Date/Time   NA 138 10/15/2017 1201   K 4.5 10/15/2017 1201   CL 101 10/15/2017 1201   CO2 29 10/15/2017 1201   BUN 17 10/15/2017 1201   CREATININE 0.86 10/15/2017 1201      Component Value Date/Time   CALCIUM 9.4 10/15/2017 1201   ALKPHOS 90 09/29/2016 1127   AST 16 09/29/2016 1127   ALT 12 09/29/2016 1127   BILITOT 0.8 09/29/2016 1127       IMPRESSION AND PLAN:  Viral URI with cough. No sign of bacterial infection, flu, or RAD at this time. Symptomatic care reviewed. Get otc generic robitussin DM OR Mucinex DM and use as directed on the packaging for cough and congestion. Use otc generic saline nasal spray 2-3 times per day to irrigate/moisturize your nasal passages. Hydrate well with clear fluids. Rest.  An After Visit Summary was printed and given to the patient.  FOLLOW UP: Return if symptoms worsen or fail to improve.  Signed:  Crissie Sickles, MD           11/03/2017

## 2017-12-07 DIAGNOSIS — H401113 Primary open-angle glaucoma, right eye, severe stage: Secondary | ICD-10-CM | POA: Diagnosis not present

## 2017-12-07 DIAGNOSIS — H401122 Primary open-angle glaucoma, left eye, moderate stage: Secondary | ICD-10-CM | POA: Diagnosis not present

## 2017-12-20 ENCOUNTER — Other Ambulatory Visit: Payer: Self-pay | Admitting: Family Medicine

## 2018-01-02 ENCOUNTER — Other Ambulatory Visit: Payer: Self-pay | Admitting: Family Medicine

## 2018-01-23 ENCOUNTER — Other Ambulatory Visit: Payer: Self-pay | Admitting: Family Medicine

## 2018-02-18 ENCOUNTER — Other Ambulatory Visit: Payer: Self-pay | Admitting: Family Medicine

## 2018-04-11 DIAGNOSIS — H401122 Primary open-angle glaucoma, left eye, moderate stage: Secondary | ICD-10-CM | POA: Diagnosis not present

## 2018-04-11 DIAGNOSIS — H401113 Primary open-angle glaucoma, right eye, severe stage: Secondary | ICD-10-CM | POA: Diagnosis not present

## 2018-04-14 ENCOUNTER — Encounter: Payer: Self-pay | Admitting: Family Medicine

## 2018-04-14 ENCOUNTER — Ambulatory Visit (INDEPENDENT_AMBULATORY_CARE_PROVIDER_SITE_OTHER): Payer: Medicare Other | Admitting: Family Medicine

## 2018-04-14 VITALS — BP 116/62 | HR 65 | Temp 97.7°F | Resp 16 | Ht 60.0 in | Wt 149.5 lb

## 2018-04-14 DIAGNOSIS — E2839 Other primary ovarian failure: Secondary | ICD-10-CM | POA: Diagnosis not present

## 2018-04-14 DIAGNOSIS — E663 Overweight: Secondary | ICD-10-CM | POA: Diagnosis not present

## 2018-04-14 DIAGNOSIS — I10 Essential (primary) hypertension: Secondary | ICD-10-CM | POA: Diagnosis not present

## 2018-04-14 DIAGNOSIS — F411 Generalized anxiety disorder: Secondary | ICD-10-CM

## 2018-04-14 NOTE — Progress Notes (Signed)
OFFICE VISIT  04/14/2018   CC:  Chief Complaint  Patient presents with  . Follow-up    RCI, pt in not fasting.     HPI:    Patient is a 84 y.o. Caucasian female who presents for 6 mo f/u HTN, anxiety.  HTN: no home bp monitoring.  Compliant with meds as rx'd. Feeling very well.  Anxiety: doing fine from this standpoint. Is active but no outside exercise due to bad heat lately.  Does not take calcium but takes MVI with vit D. Needs DEXA.  Past Medical History:  Diagnosis Date  . Aortic insufficiency 09/2016   mild/mod aortic insufficiency, and mild mitral insufficiency.--Repeat echo 1 yr.  . Baker's cyst of knee 02/25/2012   LE Venous Doppler May 30, '13 - non-vascular swelling/mass left popliteal fossa c/w Baker's cyst   . CARCINOMA, BREAST, LEFT 1993   Lumpectomy, adjuvant XRT, then tamoxifen.  Gets annual mammograms, no hx of abnormals--as of 2016, patient has chosen to stop getting screening mammograms.  . DEPRESSION, SITUATIONAL 03/26/2009   Qualifier: Diagnosis of  By: Linda Hedges MD, [place] 06/07/2007   Qualifier: Diagnosis of  By: Danny Lawless CMA, [place]    . HYPERLIPIDEMIA 06/07/2007   Qualifier: Diagnosis of  By: Danny Lawless CMA, [place]    . HYPERTENSION 06/07/2007   Qualifier: Diagnosis of  By: Danny Lawless CMA, [place]    . HYSTERECTOMY, HX OF 06/07/2007   Qualifier: Diagnosis of  By: Danny Lawless CMA, [place]    . Personal history of surgery to other organs 06/07/2007   Centricity Description: BLADDER REPAIR, HX OF Qualifier: Diagnosis of  By: [place], [place]   Centricity Description: LUMPECTOMY, BREAST, HX OF Qualifier: Diagnosis of  By: [place], [place]    . POSTMENOPAUSAL OSTEOPOROSIS 11/18/2010   Qualifier: Diagnosis of  By: Linda Hedges MD, Heinz Knuckles     Past Surgical History:  Procedure Laterality Date  . ABDOMINAL HYSTERECTOMY     Benign reasons per pt, but she can't recall exactly what dx.  Ovaries and tubes are still in.    Marland Kitchen BLADDER REPAIR    . BREAST SURGERY   1993   lumpectomy w/adjuvant XRT  . EYE SURGERY  2011   cataract surgery [facility]   . TRANSTHORACIC ECHOCARDIOGRAM  10/09/2016   EF 60-65%, normal wall motion, grd I DD, mild/mod aortic regurg, mild mitral regurg.    Outpatient Medications Prior to Visit  Medication Sig Dispense Refill  . ALPHAGAN P 0.1 % SOLN     . amLODipine (NORVASC) 10 MG tablet TAKE 1 TABLET (10 MG TOTAL) BY MOUTH DAILY. 90 tablet 1  . AZOPT 1 % ophthalmic suspension Place 1 drop into both eyes 2 (two) times daily.    . hydrochlorothiazide (HYDRODIURIL) 25 MG tablet TAKE 1 TABLET DAILY 90 tablet 1  . hydrOXYzine (ATARAX/VISTARIL) 25 MG tablet Take 1 tablet at bedtime as needed for insomnia 30 tablet 1  . latanoprost (XALATAN) 0.005 % ophthalmic solution Place 1 drop into both eyes at bedtime.    Marland Kitchen losartan (COZAAR) 100 MG tablet TAKE 1 TABLET (100 MG TOTAL) BY MOUTH DAILY. 90 tablet 1  . metoprolol succinate (TOPROL-XL) 25 MG 24 hr tablet TAKE 1 TABLET (25 MG TOTAL) BY MOUTH EVERY MORNING. 90 tablet 1  . Multiple Vitamins-Minerals (ICAPS AREDS FORMULA PO) Take 1 capsule by mouth 2 (two) times daily.     . sertraline (ZOLOFT) 50 MG tablet TAKE 1 TABLET BY MOUTH AT BEDTIME 90 tablet 1  .  timolol (TIMOPTIC) 0.5 % ophthalmic solution      No facility-administered medications prior to visit.     Allergies  Allergen Reactions  . Atorvastatin Other (See Comments)    Leg myalgias  . Prednisone Other (See Comments)    Various cognitive/psychiatric side effects    ROS As per HPI  PE: Blood pressure 116/62, pulse 65, temperature 97.7 F (36.5 C), temperature source Oral, resp. rate 16, height 5' (1.524 m), weight 149 lb 8 oz (67.8 kg), SpO2 97 %. Gen: Alert, well appearing.  Patient is oriented to person, place, time, and situation. AFFECT: pleasant, lucid thought and speech. CV: RRR, no m/r/g.   LUNGS: CTA bilat, nonlabored resps, good aeration in all lung fields. EXT: trace to 1+ pitting edema bilat LLs/ankles  mostly  LABS:  Lab Results  Component Value Date   TSH 3.17 09/29/2016   Lab Results  Component Value Date   WBC 10.8 (H) 09/29/2016   HGB 14.5 09/29/2016   HCT 43.0 09/29/2016   MCV 95.3 09/29/2016   PLT 277.0 09/29/2016   Lab Results  Component Value Date   CREATININE 0.86 10/15/2017   BUN 17 10/15/2017   NA 138 10/15/2017   K 4.5 10/15/2017   CL 101 10/15/2017   CO2 29 10/15/2017   Lab Results  Component Value Date   ALT 12 09/29/2016   AST 16 09/29/2016   ALKPHOS 90 09/29/2016   BILITOT 0.8 09/29/2016   Lab Results  Component Value Date   CHOL 176 01/17/2014   Lab Results  Component Value Date   HDL 57.60 01/17/2014   Lab Results  Component Value Date   LDLCALC 91 01/17/2014   Lab Results  Component Value Date   TRIG 139.0 01/17/2014   Lab Results  Component Value Date   CHOLHDL 3 01/17/2014     IMPRESSION AND PLAN:  1) HTN: The current medical regimen is effective;  continue present plan and medications. Lytes/cr today.  2) GAD; I believe she is now over her exaggerated/abnormal grief response. Continue sertraline 50mg  qhs.  3) Preventative health care: bone density screening-->ordered DEXA today.   An After Visit Summary was printed and given to the patient.  FOLLOW UP: 6 mo RCI  Signed:  Crissie Sickles, MD           04/19/2018

## 2018-04-15 LAB — BASIC METABOLIC PANEL
BUN / CREAT RATIO: 24 (calc) — AB (ref 6–22)
BUN: 23 mg/dL (ref 7–25)
CHLORIDE: 106 mmol/L (ref 98–110)
CO2: 26 mmol/L (ref 20–32)
CREATININE: 0.96 mg/dL — AB (ref 0.60–0.88)
Calcium: 9.8 mg/dL (ref 8.6–10.4)
Glucose, Bld: 97 mg/dL (ref 65–99)
POTASSIUM: 4.2 mmol/L (ref 3.5–5.3)
SODIUM: 141 mmol/L (ref 135–146)

## 2018-07-06 ENCOUNTER — Other Ambulatory Visit: Payer: Self-pay | Admitting: Family Medicine

## 2018-08-05 DIAGNOSIS — Z23 Encounter for immunization: Secondary | ICD-10-CM | POA: Diagnosis not present

## 2018-08-10 DIAGNOSIS — H401122 Primary open-angle glaucoma, left eye, moderate stage: Secondary | ICD-10-CM | POA: Diagnosis not present

## 2018-08-10 DIAGNOSIS — H353132 Nonexudative age-related macular degeneration, bilateral, intermediate dry stage: Secondary | ICD-10-CM | POA: Diagnosis not present

## 2018-08-10 DIAGNOSIS — H524 Presbyopia: Secondary | ICD-10-CM | POA: Diagnosis not present

## 2018-08-10 DIAGNOSIS — H401113 Primary open-angle glaucoma, right eye, severe stage: Secondary | ICD-10-CM | POA: Diagnosis not present

## 2018-08-11 ENCOUNTER — Other Ambulatory Visit: Payer: Self-pay

## 2018-08-11 MED ORDER — LOSARTAN POTASSIUM 100 MG PO TABS: 100.0000 mg | ORAL_TABLET | Freq: Every day | ORAL | 1 refills | Status: DC

## 2018-08-19 ENCOUNTER — Other Ambulatory Visit: Payer: Self-pay | Admitting: Family Medicine

## 2019-01-18 ENCOUNTER — Other Ambulatory Visit: Payer: Self-pay | Admitting: Family Medicine

## 2019-02-02 ENCOUNTER — Other Ambulatory Visit: Payer: Self-pay | Admitting: Family Medicine

## 2019-02-03 ENCOUNTER — Other Ambulatory Visit: Payer: Self-pay

## 2019-02-03 MED ORDER — METOPROLOL SUCCINATE ER 25 MG PO TB24: 25.0000 mg | ORAL_TABLET | Freq: Every morning | ORAL | 0 refills | Status: DC

## 2019-02-11 ENCOUNTER — Other Ambulatory Visit: Payer: Self-pay | Admitting: Family Medicine

## 2019-02-13 ENCOUNTER — Other Ambulatory Visit: Payer: Self-pay

## 2019-02-13 ENCOUNTER — Ambulatory Visit (INDEPENDENT_AMBULATORY_CARE_PROVIDER_SITE_OTHER): Payer: Medicare Other | Admitting: Family Medicine

## 2019-02-13 ENCOUNTER — Ambulatory Visit: Payer: Medicare Other | Admitting: Family Medicine

## 2019-02-13 ENCOUNTER — Encounter: Payer: Self-pay | Admitting: Family Medicine

## 2019-02-13 DIAGNOSIS — I10 Essential (primary) hypertension: Secondary | ICD-10-CM

## 2019-02-13 DIAGNOSIS — F411 Generalized anxiety disorder: Secondary | ICD-10-CM | POA: Diagnosis not present

## 2019-02-13 MED ORDER — AMLODIPINE BESYLATE 10 MG PO TABS: 10.0000 mg | ORAL_TABLET | Freq: Every day | ORAL | 1 refills | Status: DC

## 2019-02-13 MED ORDER — SERTRALINE HCL 50 MG PO TABS: 50.0000 mg | ORAL_TABLET | Freq: Every day | ORAL | 1 refills | Status: DC

## 2019-02-13 MED ORDER — LOSARTAN POTASSIUM 100 MG PO TABS: 100.0000 mg | ORAL_TABLET | Freq: Every day | ORAL | 1 refills | Status: DC

## 2019-02-13 NOTE — Telephone Encounter (Signed)
Pt's last RCI follow up was July 2019. Called and spoke w/ pt to schedule appt for med refills. Appt later this afternoon

## 2019-02-13 NOTE — Progress Notes (Signed)
Virtual Visit via Video Note  I connected with Amy Turner on 02/13/19 at  2:00 PM EDT by telephone (Amy Turner does not have the technology required for video visit) and verified that I am speaking with the correct person using two identifiers.  Location patient: home Location provider:work or home office Persons participating in the virtual visit: patient, provider  I discussed the limitations of evaluation and management by telemedicine/telephone and the availability of in person appointments. The patient expressed understanding and agreed to proceed.  Telemedicine/telephone visit is a necessity given the COVID-19 restrictions in place at the current time.  HPI: 85 y/o WF with whom I am doing a telephone visit today (due to COVID-19 pandemic restrictions) for f/u HTN and GAD. Says she is doing well.  Staying in almost all the time b/c of quarantine/stay at home orders. She has no complaints.  HTN: she takes 4 bp meds->amlodipine, losartan, hctz, and metoprolol. No home bp monitoring at all lately.  She says she can tell when her bp is up or down. "I feel like I'm in pretty good shape.".  GAD/situational anxiety: she went through quite a prolonged/abnormal grief reaction to the death of her husband back in January 17, 2016.  She was essentially back to baseline at the time of last visit 04/14/18. I continued her on sertraline 50 mg qd at that time.  She still takes this and feels like her anxiety is stable and she denies depression.  ROS: no CP, no SOB, no wheezing, no cough, no dizziness, no HAs, no rashes, no melena/hematochezia.  No polyuria or polydipsia.  No myalgias or arthralgias.    Past Medical History:  Diagnosis Date  . Aortic insufficiency 09/2016   mild/mod aortic insufficiency, and mild mitral insufficiency.--Repeat echo 1 yr.  . Baker's cyst of knee 02/25/2012   LE Venous Doppler May 30, '13 - non-vascular swelling/mass left popliteal fossa c/w Baker's cyst   . CARCINOMA, BREAST, LEFT 1993   Lumpectomy, adjuvant XRT, then tamoxifen.  Gets annual mammograms, no hx of abnormals--as of 01-17-15, patient has chosen to stop getting screening mammograms.  . DEPRESSION, SITUATIONAL 03/26/2009   Qualifier: Diagnosis of  By: Linda Hedges MD, [place] 06/07/2007   Qualifier: Diagnosis of  By: Danny Lawless CMA, [place]    . HYPERLIPIDEMIA 06/07/2007   Qualifier: Diagnosis of  By: Danny Lawless CMA, [place]    . HYPERTENSION 06/07/2007   Qualifier: Diagnosis of  By: Danny Lawless CMA, [place]    . HYSTERECTOMY, HX OF 06/07/2007   Qualifier: Diagnosis of  By: Danny Lawless CMA, [place]    . Personal history of surgery to other organs 06/07/2007   Centricity Description: BLADDER REPAIR, HX OF Qualifier: Diagnosis of  By: [place], [place]   Centricity Description: LUMPECTOMY, BREAST, HX OF Qualifier: Diagnosis of  By: [place], [place]    . POSTMENOPAUSAL OSTEOPOROSIS 11/18/2010   Qualifier: Diagnosis of  By: Linda Hedges MD, Heinz Knuckles     Past Surgical History:  Procedure Laterality Date  . ABDOMINAL HYSTERECTOMY     Benign reasons per Amy Turner, but she can't recall exactly what dx.  Ovaries and tubes are still in.    Marland Kitchen BLADDER REPAIR    . BREAST SURGERY  1993   lumpectomy w/adjuvant XRT  . EYE SURGERY  Jan 16, 2010   cataract surgery [facility]   . TRANSTHORACIC ECHOCARDIOGRAM  10/09/2016   EF 60-65%, normal wall motion, grd I DD, mild/mod aortic regurg, mild mitral regurg.    Family History  Problem Relation Age of  Onset  . Hyperlipidemia Mother   . Hypertension Mother   . Cervical cancer Mother   . Coronary artery disease Mother   . Depression Father   . Colon cancer Neg Hx   . Diabetes Neg Hx     SOCIAL HX: Widow, retired from [place] [employer], no tob/alc.   Current Outpatient Medications:  .  ALPHAGAN P 0.1 % SOLN, , Disp: , Rfl:  .  amLODipine (NORVASC) 10 MG tablet, TAKE 1 TABLET (10 MG TOTAL) BY MOUTH DAILY., Disp: 90 tablet, Rfl: 1 .  AZOPT 1 % ophthalmic suspension, Place 1 drop into both eyes 2 (two) times  daily., Disp: , Rfl:  .  hydrochlorothiazide (HYDRODIURIL) 25 MG tablet, Take 1 tablet (25 mg total) by mouth daily. OFFICE VISIT NEEDED, Disp: 90 tablet, Rfl: 0 .  latanoprost (XALATAN) 0.005 % ophthalmic solution, Place 1 drop into both eyes at bedtime., Disp: , Rfl:  .  losartan (COZAAR) 100 MG tablet, Take 1 tablet (100 mg total) by mouth daily., Disp: 90 tablet, Rfl: 1 .  metoprolol succinate (TOPROL-XL) 25 MG 24 hr tablet, Take 1 tablet (25 mg total) by mouth every morning. OFFICE VISIT NEEDED, Disp: 30 tablet, Rfl: 0 .  Multiple Vitamins-Minerals (ICAPS AREDS FORMULA PO), Take 1 capsule by mouth 2 (two) times daily. , Disp: , Rfl:  .  sertraline (ZOLOFT) 50 MG tablet, Take 1 tablet (50 mg total) by mouth at bedtime. OFFICE VISIT NEEDED, Disp: 30 tablet, Rfl: 0 .  timolol (TIMOPTIC) 0.5 % ophthalmic solution, , Disp: , Rfl:  .  hydrOXYzine (ATARAX/VISTARIL) 25 MG tablet, Take 1 tablet at bedtime as needed for insomnia (Patient not taking: Reported on 02/13/2019), Disp: 30 tablet, Rfl: 1  EXAM:  VITALS per patient if applicable:  GENERAL: alert, oriented, sounds well and in no acute distress. Lucid thought and speech. No further exam b/c this was a telephone visit.    LABS: none today    Chemistry      Component Value Date/Time   NA 141 04/14/2018 1135   K 4.2 04/14/2018 1135   CL 106 04/14/2018 1135   CO2 26 04/14/2018 1135   BUN 23 04/14/2018 1135   CREATININE 0.96 (H) 04/14/2018 1135      Component Value Date/Time   CALCIUM 9.8 04/14/2018 1135   ALKPHOS 90 09/29/2016 1127   AST 16 09/29/2016 1127   ALT 12 09/29/2016 1127   BILITOT 0.8 09/29/2016 1127      ASSESSMENT AND PLAN:  Discussed the following assessment and plan:  1) HTN; The current medical regimen is effective;  continue present plan and medications. BMET-future.  2) GAD: The current medical regimen is effective;  continue present plan and medications.   I discussed the assessment and treatment plan  with the patient. The patient was provided an opportunity to ask questions and all were answered. The patient agreed with the plan and demonstrated an understanding of the instructions.   The patient was advised to call back or seek an in-person evaluation if the symptoms worsen or if the condition fails to improve as anticipated.  Spent 15 min with Amy Turner today, with >50% of this time spent in counseling and care coordination regarding the above problems.  F/u: 6 mo RCI  Tammi Sou, MD

## 2019-02-13 NOTE — Telephone Encounter (Signed)
Refills have been sent by PCP during or after pt's appt today.

## 2019-02-22 ENCOUNTER — Other Ambulatory Visit: Payer: Self-pay

## 2019-02-22 ENCOUNTER — Ambulatory Visit (INDEPENDENT_AMBULATORY_CARE_PROVIDER_SITE_OTHER): Payer: Medicare Other | Admitting: Family Medicine

## 2019-02-22 DIAGNOSIS — I10 Essential (primary) hypertension: Secondary | ICD-10-CM

## 2019-02-22 NOTE — Addendum Note (Signed)
Addended by: Ralph Dowdy on: 02/22/2019 09:39 AM   Modules accepted: Orders

## 2019-02-23 LAB — BASIC METABOLIC PANEL
BUN/Creatinine Ratio: 23 (calc) — ABNORMAL HIGH (ref 6–22)
BUN: 22 mg/dL (ref 7–25)
CO2: 25 mmol/L (ref 20–32)
Calcium: 9.9 mg/dL (ref 8.6–10.4)
Chloride: 106 mmol/L (ref 98–110)
Creat: 0.94 mg/dL — ABNORMAL HIGH (ref 0.60–0.88)
Glucose, Bld: 118 mg/dL — ABNORMAL HIGH (ref 65–99)
Potassium: 4.4 mmol/L (ref 3.5–5.3)
Sodium: 142 mmol/L (ref 135–146)

## 2019-02-25 ENCOUNTER — Other Ambulatory Visit: Payer: Self-pay | Admitting: Family Medicine

## 2019-04-27 ENCOUNTER — Other Ambulatory Visit: Payer: Self-pay | Admitting: Family Medicine

## 2019-05-26 ENCOUNTER — Other Ambulatory Visit: Payer: Self-pay | Admitting: Family Medicine

## 2019-06-23 ENCOUNTER — Other Ambulatory Visit: Payer: Self-pay | Admitting: Family Medicine

## 2019-07-26 DIAGNOSIS — H401122 Primary open-angle glaucoma, left eye, moderate stage: Secondary | ICD-10-CM | POA: Diagnosis not present

## 2019-07-26 DIAGNOSIS — H52203 Unspecified astigmatism, bilateral: Secondary | ICD-10-CM | POA: Diagnosis not present

## 2019-07-26 DIAGNOSIS — H353132 Nonexudative age-related macular degeneration, bilateral, intermediate dry stage: Secondary | ICD-10-CM | POA: Diagnosis not present

## 2019-07-26 DIAGNOSIS — H401113 Primary open-angle glaucoma, right eye, severe stage: Secondary | ICD-10-CM | POA: Diagnosis not present

## 2019-08-04 ENCOUNTER — Other Ambulatory Visit: Payer: Self-pay | Admitting: Family Medicine

## 2019-08-15 ENCOUNTER — Other Ambulatory Visit: Payer: Self-pay

## 2019-08-16 ENCOUNTER — Other Ambulatory Visit: Payer: Self-pay | Admitting: Family Medicine

## 2019-08-16 ENCOUNTER — Encounter: Payer: Self-pay | Admitting: Family Medicine

## 2019-08-16 ENCOUNTER — Ambulatory Visit (INDEPENDENT_AMBULATORY_CARE_PROVIDER_SITE_OTHER): Payer: Medicare Other | Admitting: Family Medicine

## 2019-08-16 VITALS — BP 146/77 | HR 77 | Temp 97.7°F | Resp 16 | Ht 60.0 in | Wt 150.2 lb

## 2019-08-16 DIAGNOSIS — Z23 Encounter for immunization: Secondary | ICD-10-CM | POA: Diagnosis not present

## 2019-08-16 DIAGNOSIS — I10 Essential (primary) hypertension: Secondary | ICD-10-CM

## 2019-08-16 DIAGNOSIS — F411 Generalized anxiety disorder: Secondary | ICD-10-CM | POA: Diagnosis not present

## 2019-08-16 LAB — BASIC METABOLIC PANEL
BUN: 17 mg/dL (ref 6–23)
CO2: 27 mEq/L (ref 19–32)
Calcium: 9.2 mg/dL (ref 8.4–10.5)
Chloride: 107 mEq/L (ref 96–112)
Creatinine, Ser: 0.8 mg/dL (ref 0.40–1.20)
GFR: 67.21 mL/min (ref >60.00)
Glucose, Bld: 117 mg/dL — ABNORMAL HIGH (ref 70–99)
Potassium: 3.4 mEq/L — ABNORMAL LOW (ref 3.5–5.1)
Sodium: 143 mEq/L (ref 135–145)

## 2019-08-16 MED ORDER — ZOSTER VAC RECOMB ADJUVANTED 50 MCG/0.5ML IM SUSR: 0.5000 mL | Freq: Once | INTRAMUSCULAR | 0 refills | Status: AC

## 2019-08-16 NOTE — Addendum Note (Signed)
Addended by: Deveron Furlong D on: 08/16/2019 10:12 AM   Modules accepted: Orders

## 2019-08-16 NOTE — Progress Notes (Signed)
OFFICE VISIT  08/16/2019   CC:  Chief Complaint  Patient presents with  . Follow-up    RCI, pt is not fasting     HPI:    Patient is a 85 y.o. Caucasian female who presents for 6 mo f/u GAD as well as HTN.  HTN: she takes 4 bp meds->amlodipine, losartan, hctz, and metoprolol.   GAD/situational anxiety: she went through quite a prolonged/abnormal grief reaction to the death of her husband back in January 18, 2016.  She was essentially back to baseline at the time of last visit 01/2019. I continued her on sertraline 50 mg qd at that time.   Says she is doing fine, coping with all the covid stress ok.  No depression, no panic.  Functioning/taking care of herself well.  No home bp monitoring.  Compliant with all meds.  ROS: no CP, no SOB, no wheezing, no cough, no dizziness, no HAs, no rashes, no melena/hematochezia.  No polyuria or polydipsia.  No myalgias or arthralgias.  No new complaints.    Past Medical History:  Diagnosis Date  . Aortic insufficiency 09/2016   mild/mod aortic insufficiency, and mild mitral insufficiency.--Repeat echo 1 yr.  . Baker's cyst of knee 02/25/2012   LE Venous Doppler May 30, '13 - non-vascular swelling/mass left popliteal fossa c/w Baker's cyst   . CARCINOMA, BREAST, LEFT 1993   Lumpectomy, adjuvant XRT, then tamoxifen.  Gets annual mammograms, no hx of abnormals--as of 01-18-15, patient has chosen to stop getting screening mammograms.  . DEPRESSION, SITUATIONAL 03/26/2009   Qualifier: Diagnosis of  By: Linda Hedges MD, [place] 06/07/2007   Qualifier: Diagnosis of  By: Danny Lawless CMA, [place]    . HYPERLIPIDEMIA 06/07/2007   Qualifier: Diagnosis of  By: Danny Lawless CMA, [place]    . HYPERTENSION 06/07/2007   Qualifier: Diagnosis of  By: Danny Lawless CMA, [place]    . HYSTERECTOMY, HX OF 06/07/2007   Qualifier: Diagnosis of  By: Danny Lawless CMA, [place]    . Personal history of surgery to other organs 06/07/2007   Centricity Description: BLADDER REPAIR, HX OF Qualifier:  Diagnosis of  By: [place], [place]   Centricity Description: LUMPECTOMY, BREAST, HX OF Qualifier: Diagnosis of  By: [place], [place]    . POSTMENOPAUSAL OSTEOPOROSIS 11/18/2010   Qualifier: Diagnosis of  By: Linda Hedges MD, Heinz Knuckles     Past Surgical History:  Procedure Laterality Date  . ABDOMINAL HYSTERECTOMY     Benign reasons per pt, but she can't recall exactly what dx.  Ovaries and tubes are still in.    Marland Kitchen BLADDER REPAIR    . BREAST SURGERY  1993   lumpectomy w/adjuvant XRT  . EYE SURGERY  January 17, 2010   cataract surgery [facility]   . TRANSTHORACIC ECHOCARDIOGRAM  10/09/2016   EF 60-65%, normal wall motion, grd I DD, mild/mod aortic regurg, mild mitral regurg.    Outpatient Medications Prior to Visit  Medication Sig Dispense Refill  . amLODipine (NORVASC) 10 MG tablet Take 1 tablet (10 mg total) by mouth daily. 90 tablet 1  . hydrochlorothiazide (HYDRODIURIL) 25 MG tablet Take 1 tablet (25 mg total) by mouth daily. 90 tablet 1  . latanoprost (XALATAN) 0.005 % ophthalmic solution Place 1 drop into both eyes at bedtime.    Marland Kitchen losartan (COZAAR) 50 MG tablet Take 100 mg by mouth daily.    . metoprolol succinate (TOPROL-XL) 25 MG 24 hr tablet TAKE 1 TABLET BY MOUTH EVERY DAY 90 tablet 1  . Multiple Vitamins-Minerals (ICAPS  AREDS FORMULA PO) Take 1 capsule by mouth 2 (two) times daily.     . sertraline (ZOLOFT) 50 MG tablet Take 1 tablet (50 mg total) by mouth at bedtime. OFFICE VISIT NEEDED 90 tablet 1  . timolol (TIMOPTIC) 0.5 % ophthalmic solution     . losartan (COZAAR) 100 MG tablet Take 1 tablet (100 mg total) by mouth daily. 90 tablet 1  . ALPHAGAN P 0.1 % SOLN     . AZOPT 1 % ophthalmic suspension Place 1 drop into both eyes 2 (two) times daily.    . hydrOXYzine (ATARAX/VISTARIL) 25 MG tablet Take 1 tablet at bedtime as needed for insomnia (Patient not taking: Reported on 02/13/2019) 30 tablet 1   No facility-administered medications prior to visit.     Allergies  Allergen Reactions   . Atorvastatin Other (See Comments)    Leg myalgias  . Prednisone Other (See Comments)    Various cognitive/psychiatric side effects    ROS As per HPI  PE: Blood pressure (!) 146/77, pulse 77, temperature 97.7 F (36.5 C), temperature source Temporal, resp. rate 16, height 5' (1.524 m), weight 150 lb 3.2 oz (68.1 kg), SpO2 97 %. Body mass index is 29.33 kg/m.  Gen: Alert, well appearing.  Patient is oriented to person, place, time, and situation. AFFECT: pleasant, lucid thought and speech. CV: RRR, [id number] systolic murmur heard best along LSB, no diastolic murmur.  No r/g.   LUNGS: CTA bilat, nonlabored resps, good aeration in all lung fields. EXT: no clubbing or cyanosis.  no edema.   LABS:    Chemistry      Component Value Date/Time   NA 142 02/22/2019 1420   K 4.4 02/22/2019 1420   CL 106 02/22/2019 1420   CO2 25 02/22/2019 1420   BUN 22 02/22/2019 1420   CREATININE 0.94 (H) 02/22/2019 1420      Component Value Date/Time   CALCIUM 9.9 02/22/2019 1420   ALKPHOS 90 09/29/2016 1127   AST 16 09/29/2016 1127   ALT 12 09/29/2016 1127   BILITOT 0.8 09/29/2016 1127     Lab Results  Component Value Date   CHOL 176 01/17/2014   HDL 57.60 01/17/2014   LDLCALC 91 01/17/2014   LDLDIRECT 124.7 11/14/2010   TRIG 139.0 01/17/2014   CHOLHDL 3 01/17/2014   Lab Results  Component Value Date   WBC 10.8 (H) 09/29/2016   HGB 14.5 09/29/2016   HCT 43.0 09/29/2016   MCV 95.3 09/29/2016   PLT 277.0 09/29/2016   IMPRESSION AND PLAN:  1) GAD: The current medical regimen is effective;  continue present plan and medications.  2) HTN: The current medical regimen is effective;  continue present plan and medications. BMET today.  3) Preventative health care: Flu vaccine->given today.   Shingrix->rx sent to pharmacy.  An After Visit Summary was printed and given to the patient.  FOLLOW UP: Return in about 6 months (around 02/13/2020) for routine chronic illness f/u.  Signed:   Crissie Sickles, MD           08/16/2019

## 2019-08-16 NOTE — Addendum Note (Signed)
Addended by: Marlene Bast A on: 08/16/2019 09:38 AM   Modules accepted: Orders

## 2019-08-17 ENCOUNTER — Other Ambulatory Visit: Payer: Self-pay | Admitting: Family Medicine

## 2019-08-23 ENCOUNTER — Other Ambulatory Visit: Payer: Self-pay

## 2019-11-03 ENCOUNTER — Other Ambulatory Visit: Payer: Self-pay | Admitting: Family Medicine

## 2019-11-17 ENCOUNTER — Other Ambulatory Visit: Payer: Self-pay

## 2019-11-17 MED ORDER — METOPROLOL SUCCINATE ER 25 MG PO TB24: 25.0000 mg | ORAL_TABLET | Freq: Every day | ORAL | 0 refills | Status: DC

## 2019-11-23 ENCOUNTER — Telehealth: Payer: Self-pay

## 2019-11-23 NOTE — Telephone Encounter (Signed)
Noted, thank you

## 2019-11-23 NOTE — Telephone Encounter (Signed)
Patient called in concerned about a phone call she got from a lady "with a foreign accent". Patient states she was asking her medical questions. Advised patient to please not give any private health information & that to please handle of her care with our office. No call back requested.

## 2020-01-24 DIAGNOSIS — H401113 Primary open-angle glaucoma, right eye, severe stage: Secondary | ICD-10-CM | POA: Diagnosis not present

## 2020-01-24 DIAGNOSIS — H401122 Primary open-angle glaucoma, left eye, moderate stage: Secondary | ICD-10-CM | POA: Diagnosis not present

## 2020-02-12 ENCOUNTER — Other Ambulatory Visit: Payer: Self-pay

## 2020-02-12 MED ORDER — AMLODIPINE BESYLATE 10 MG PO TABS: 10.0000 mg | ORAL_TABLET | Freq: Every day | ORAL | 0 refills | Status: DC

## 2020-02-12 MED ORDER — SERTRALINE HCL 50 MG PO TABS: 50.0000 mg | ORAL_TABLET | Freq: Every day | ORAL | 0 refills | Status: DC

## 2020-02-15 ENCOUNTER — Other Ambulatory Visit: Payer: Self-pay | Admitting: Family Medicine

## 2020-02-15 NOTE — Telephone Encounter (Signed)
LM for pt to return call, due for 6 month f/u appt.

## 2020-03-06 ENCOUNTER — Other Ambulatory Visit: Payer: Self-pay | Admitting: Family Medicine

## 2020-03-07 ENCOUNTER — Other Ambulatory Visit: Payer: Self-pay | Admitting: Family Medicine

## 2020-03-07 ENCOUNTER — Other Ambulatory Visit: Payer: Self-pay

## 2020-03-07 ENCOUNTER — Telehealth: Payer: Self-pay

## 2020-03-07 MED ORDER — METOPROLOL SUCCINATE ER 25 MG PO TB24: 25.0000 mg | ORAL_TABLET | Freq: Every day | ORAL | 0 refills | Status: DC

## 2020-03-07 MED ORDER — SERTRALINE HCL 50 MG PO TABS: 50.0000 mg | ORAL_TABLET | Freq: Every day | ORAL | 0 refills | Status: DC

## 2020-03-07 NOTE — Telephone Encounter (Signed)
Patient refill request   metoprolol succinate (TOPROL-XL) 25 MG 24 hr tablet [[id number]]   Patient states that she has #2 pills left --- patient wants pharmacy to call when ready for pick up because she has no other way of knowing, and she can never get thru to them when she calls.  Please.  She needs assistance.

## 2020-03-07 NOTE — Telephone Encounter (Signed)
Patient was contacted about scheduling an appt for routine f/u. She will call back to schedule, 30 day supply sent for Metoprolol and Zoloft.

## 2020-03-15 ENCOUNTER — Other Ambulatory Visit: Payer: Self-pay

## 2020-03-15 ENCOUNTER — Encounter: Payer: Self-pay | Admitting: Family Medicine

## 2020-03-15 ENCOUNTER — Ambulatory Visit (INDEPENDENT_AMBULATORY_CARE_PROVIDER_SITE_OTHER): Payer: Medicare Other | Admitting: Family Medicine

## 2020-03-15 VITALS — BP 145/76 | HR 68 | Temp 97.9°F | Resp 16 | Ht 60.0 in | Wt 149.0 lb

## 2020-03-15 DIAGNOSIS — F411 Generalized anxiety disorder: Secondary | ICD-10-CM

## 2020-03-15 DIAGNOSIS — H6121 Impacted cerumen, right ear: Secondary | ICD-10-CM | POA: Diagnosis not present

## 2020-03-15 DIAGNOSIS — H6122 Impacted cerumen, left ear: Secondary | ICD-10-CM

## 2020-03-15 DIAGNOSIS — I10 Essential (primary) hypertension: Secondary | ICD-10-CM

## 2020-03-15 NOTE — Progress Notes (Signed)
OFFICE VISIT  04/01/2020   CC:  Chief Complaint  Patient presents with  . Follow-up    RCI, pt is not fasting     HPI:    Patient is a 86 y.o. Caucasian female who presents for 6 mo f/u GAD and HTN.  GAD: feeling like she's doing well, no panic or dwelling on worries too much. No depression.  Taking sertraline daily.  HTN: goal bp for her is mid 140s or mid 6s. She is compliant with meds.  She walks some (no cane or walker needed). Diet is fair. No home bp monitoring.  ROS: no fevers, no CP, no SOB, no wheezing, no cough, no dizziness, no HAs, no rashes, no melena/hematochezia.  No polyuria or polydipsia.  No myalgias or arthralgias.  No focal weakness, paresthesias, or tremors.  No acute vision or hearing abnormalities. No n/v/d or abd pain.  No palpitations.   Left  Ear feels very stuffed up lately. She wears a hearing aid in L ear.   Past Medical History:  Diagnosis Date  . Aortic insufficiency 09/2016   mild/mod aortic insufficiency, and mild mitral insufficiency.--Repeat echo 1 yr.  . Baker's cyst of knee 02/25/2012   LE Venous Doppler May 30, '13 - non-vascular swelling/mass left popliteal fossa c/w Baker's cyst   . CARCINOMA, BREAST, LEFT 1993   Lumpectomy, adjuvant XRT, then tamoxifen.  Gets annual mammograms, no hx of abnormals--as of 2016, patient has chosen to stop getting screening mammograms.  . DEPRESSION, SITUATIONAL 03/26/2009   Qualifier: Diagnosis of  By: Linda Hedges MD, [place] 06/07/2007   Qualifier: Diagnosis of  By: Danny Lawless CMA, [place]    . HYPERLIPIDEMIA 06/07/2007   Qualifier: Diagnosis of  By: Danny Lawless CMA, [place]    . HYPERTENSION 06/07/2007   Qualifier: Diagnosis of  By: Danny Lawless CMA, [place]    . HYSTERECTOMY, HX OF 06/07/2007   Qualifier: Diagnosis of  By: Danny Lawless CMA, [place]    . Personal history of surgery to other organs 06/07/2007   Centricity Description: BLADDER REPAIR, HX OF Qualifier: Diagnosis of  By: [place], [place]   Centricity  Description: LUMPECTOMY, BREAST, HX OF Qualifier: Diagnosis of  By: [place], [place]    . POSTMENOPAUSAL OSTEOPOROSIS 11/18/2010   Qualifier: Diagnosis of  By: Linda Hedges MD, Heinz Knuckles     Past Surgical History:  Procedure Laterality Date  . ABDOMINAL HYSTERECTOMY     Benign reasons per pt, but she can't recall exactly what dx.  Ovaries and tubes are still in.    Marland Kitchen BLADDER REPAIR    . BREAST SURGERY  1993   lumpectomy w/adjuvant XRT  . EYE SURGERY  2011   cataract surgery [facility]   . TRANSTHORACIC ECHOCARDIOGRAM  10/09/2016   EF 60-65%, normal wall motion, grd I DD, mild/mod aortic regurg, mild mitral regurg.    Outpatient Medications Prior to Visit  Medication Sig Dispense Refill  . ALPHAGAN P 0.1 % SOLN     . amLODipine (NORVASC) 10 MG tablet Take 1 tablet (10 mg total) by mouth daily. 30 tablet 0  . AZOPT 1 % ophthalmic suspension Place 1 drop into both eyes 2 (two) times daily.    . hydrochlorothiazide (HYDRODIURIL) 25 MG tablet TAKE 1 TABLET BY MOUTH EVERY DAY 90 tablet 1  . latanoprost (XALATAN) 0.005 % ophthalmic solution Place 1 drop into both eyes at bedtime.    Marland Kitchen losartan (COZAAR) 50 MG tablet TAKE 2 TABLETS BY MOUTH EVERY DAY 180 tablet  1  . metoprolol succinate (TOPROL-XL) 25 MG 24 hr tablet Take 1 tablet (25 mg total) by mouth daily. Appointment needed 30 tablet 0  . Multiple Vitamins-Minerals (ICAPS AREDS FORMULA PO) Take 1 capsule by mouth 2 (two) times daily.     . sertraline (ZOLOFT) 50 MG tablet Take 1 tablet (50 mg total) by mouth at bedtime. 30 tablet 0  . timolol (TIMOPTIC) 0.5 % ophthalmic solution     . hydrOXYzine (ATARAX/VISTARIL) 25 MG tablet Take 1 tablet at bedtime as needed for insomnia (Patient not taking: Reported on 02/13/2019) 30 tablet 1   No facility-administered medications prior to visit.    Allergies  Allergen Reactions  . Atorvastatin Other (See Comments)    Leg myalgias  . Prednisone Other (See Comments)    Various cognitive/psychiatric side  effects    ROS As per HPI  PE: Vitals with BMI 03/15/2020 08/16/2019 04/14/2018  Height 5\' 0"  5\' 0"  5\' 0"   Weight 149 lbs 150 lbs 3 oz 149 lbs 8 oz  BMI 29.1 35.46 56.8  Systolic 127 517 001  Diastolic 76 77 62  Pulse 68 77 65  Some encounter information is confidential and restricted. Go to Review Flowsheets activity to see all data.    Gen: Alert, well appearing.  Patient is oriented to person, place, time, and situation. AFFECT: pleasant, lucid thought and speech. R ear EAC full of cerumen--this was cleared with irrigation and curette extraction. L ear EAC full of cerumen: unable to remove any substantial amount of cerumen. TM could not be visualized.  LABS:  Lab Results  Component Value Date   TSH 3.17 09/29/2016   Lab Results  Component Value Date   WBC 10.8 (H) 09/29/2016   HGB 14.5 09/29/2016   HCT 43.0 09/29/2016   MCV 95.3 09/29/2016   PLT 277.0 09/29/2016   Lab Results  Component Value Date   CREATININE 0.80 08/16/2019   BUN 17 08/16/2019   NA 143 08/16/2019   K 3.4 (L) 08/16/2019   CL 107 08/16/2019   CO2 27 08/16/2019   Lab Results  Component Value Date   ALT 12 09/29/2016   AST 16 09/29/2016   ALKPHOS 90 09/29/2016   BILITOT 0.8 09/29/2016   Lab Results  Component Value Date   CHOL 176 01/17/2014   Lab Results  Component Value Date   HDL 57.60 01/17/2014   Lab Results  Component Value Date   LDLCALC 91 01/17/2014   Lab Results  Component Value Date   TRIG 139.0 01/17/2014   Lab Results  Component Value Date   CHOLHDL 3 01/17/2014   IMPRESSION AND PLAN:  1) HTN: The current medical regimen is effective;  continue present plan and medications --hctz 25 qd, amlodipine 10mg  qd, toprol xl 25mg  qd, and losartan 50mg  tabs--2 tabs qd.  2) GAD: The current medical regimen is effective;  continue present plan and medications.  She should stay on sertraline 50mg  qd indefinitely.  3) Bilat EAC cerumen impactions: R side cleared by  irrigation and curette extraction today. L unable to clear-->refer to ENT.  An After Visit Summary was printed and given to the patient.  FOLLOW UP: Return in about 6 months (around 09/14/2020) for routine chronic illness f/u.  Signed:  Crissie Sickles, MD           04/01/2020

## 2020-03-31 ENCOUNTER — Other Ambulatory Visit: Payer: Self-pay | Admitting: Family Medicine

## 2020-04-06 ENCOUNTER — Other Ambulatory Visit: Payer: Self-pay | Admitting: Family Medicine

## 2020-04-30 ENCOUNTER — Other Ambulatory Visit: Payer: Self-pay | Admitting: Family Medicine

## 2020-05-01 ENCOUNTER — Other Ambulatory Visit: Payer: Self-pay | Admitting: Family Medicine

## 2020-05-06 ENCOUNTER — Other Ambulatory Visit: Payer: Self-pay | Admitting: Family Medicine

## 2020-05-29 ENCOUNTER — Other Ambulatory Visit: Payer: Self-pay | Admitting: Family Medicine

## 2020-06-17 ENCOUNTER — Telehealth: Payer: Self-pay

## 2020-06-17 ENCOUNTER — Other Ambulatory Visit: Payer: Self-pay

## 2020-06-17 ENCOUNTER — Encounter (HOSPITAL_BASED_OUTPATIENT_CLINIC_OR_DEPARTMENT_OTHER): Payer: Self-pay | Admitting: *Deleted

## 2020-06-17 ENCOUNTER — Emergency Department (HOSPITAL_BASED_OUTPATIENT_CLINIC_OR_DEPARTMENT_OTHER)
Admission: EM | Admit: 2020-06-17 | Discharge: 2020-06-17 | Disposition: A | Discharge status: Home / Self Care | Payer: Medicare Other | Attending: Emergency Medicine | Admitting: Emergency Medicine

## 2020-06-17 ENCOUNTER — Emergency Department (HOSPITAL_BASED_OUTPATIENT_CLINIC_OR_DEPARTMENT_OTHER): Payer: Medicare Other

## 2020-06-17 DIAGNOSIS — S99911A Unspecified injury of right ankle, initial encounter: Secondary | ICD-10-CM | POA: Diagnosis not present

## 2020-06-17 DIAGNOSIS — W010XXA Fall on same level from slipping, tripping and stumbling without subsequent striking against object, initial encounter: Secondary | ICD-10-CM | POA: Diagnosis not present

## 2020-06-17 DIAGNOSIS — Z5321 Procedure and treatment not carried out due to patient leaving prior to being seen by health care provider: Secondary | ICD-10-CM | POA: Insufficient documentation

## 2020-06-17 DIAGNOSIS — M7989 Other specified soft tissue disorders: Secondary | ICD-10-CM | POA: Diagnosis not present

## 2020-06-17 NOTE — ED Triage Notes (Signed)
She tripped 3 weeks ago and fell. Injury to her right ankle.

## 2020-06-17 NOTE — Telephone Encounter (Signed)
FYI  Please see below

## 2020-06-17 NOTE — Telephone Encounter (Signed)
Patient's brother-in-law called in to advise patient has hurt her hip. He is going to call 911 to take her to the hospital.

## 2020-06-18 DIAGNOSIS — S93491A Sprain of other ligament of right ankle, initial encounter: Secondary | ICD-10-CM | POA: Diagnosis not present

## 2020-06-19 ENCOUNTER — Emergency Department (HOSPITAL_COMMUNITY)
Admission: EM | Admit: 2020-06-19 | Discharge: 2020-06-19 | Disposition: A | Discharge status: Home / Self Care | Payer: Medicare Other | Attending: Emergency Medicine | Admitting: Emergency Medicine

## 2020-06-19 ENCOUNTER — Encounter (HOSPITAL_COMMUNITY): Payer: Self-pay

## 2020-06-19 DIAGNOSIS — I10 Essential (primary) hypertension: Secondary | ICD-10-CM | POA: Diagnosis not present

## 2020-06-19 DIAGNOSIS — W010XXA Fall on same level from slipping, tripping and stumbling without subsequent striking against object, initial encounter: Secondary | ICD-10-CM | POA: Insufficient documentation

## 2020-06-19 DIAGNOSIS — S93491A Sprain of other ligament of right ankle, initial encounter: Secondary | ICD-10-CM | POA: Diagnosis not present

## 2020-06-19 DIAGNOSIS — S93431A Sprain of tibiofibular ligament of right ankle, initial encounter: Secondary | ICD-10-CM | POA: Insufficient documentation

## 2020-06-19 DIAGNOSIS — T68XXXA Hypothermia, initial encounter: Secondary | ICD-10-CM | POA: Diagnosis not present

## 2020-06-19 DIAGNOSIS — Z79899 Other long term (current) drug therapy: Secondary | ICD-10-CM | POA: Diagnosis not present

## 2020-06-19 DIAGNOSIS — W19XXXA Unspecified fall, initial encounter: Secondary | ICD-10-CM | POA: Diagnosis not present

## 2020-06-19 DIAGNOSIS — M25571 Pain in right ankle and joints of right foot: Secondary | ICD-10-CM | POA: Diagnosis not present

## 2020-06-19 DIAGNOSIS — R52 Pain, unspecified: Secondary | ICD-10-CM | POA: Diagnosis not present

## 2020-06-19 DIAGNOSIS — S99911A Unspecified injury of right ankle, initial encounter: Secondary | ICD-10-CM | POA: Diagnosis present

## 2020-06-19 MED ORDER — ACETAMINOPHEN 500 MG PO TABS
1000.0000 mg | ORAL_TABLET | Freq: Once | ORAL | Status: AC
  Administered 2020-06-19: 1000 mg via ORAL
  Filled 2020-06-19: qty 2

## 2020-06-19 NOTE — ED Triage Notes (Signed)
Pt from home with family and pt fell maybe last week or three weeks ago and injured her ankle, went to [place] and wasn't seen Pt says that she can't put any weight on that right ankle

## 2020-06-19 NOTE — ED Provider Notes (Signed)
[facility] DEPT Provider Note  CSN: [id number] Arrival date & time: 06/19/20 4098  Chief Complaint(s) Ankle Pain  HPI Amy Turner is a 86 y.o. female with a past medical history listed below who presents to the emergency department with 1 week of right ankle pain. She reports that 3 weeks ago she stumbled over a stool which caused her to lose her balance and tripped. She denies falling onto the floor, stating that she caught herself on a dresser which "jerked her around." After that she noted some mild discomfort but was able to ambulate. 1 week ago the pain became gradually worse limiting her ability to walk. Her pain is a deep ache that is worse with ambulation and alleviated by mobility. Patient has not taken any pain medicine at home. She denies any additional trauma. No recent fevers or infections. No wounds. No prior history of gout.  Patient reports going to [place] yesterday to be evaluated however after waiting for 2 hours, she left prior to being evaluated. At that time she did get a plain film of her right ankle which was negative.  HPI  Past Medical History Past Medical History:  Diagnosis Date  . Aortic insufficiency 09/2016   mild/mod aortic insufficiency, and mild mitral insufficiency.--Repeat echo 1 yr.  . Baker's cyst of knee 02/25/2012   LE Venous Doppler May 30, '13 - non-vascular swelling/mass left popliteal fossa c/w Baker's cyst   . CARCINOMA, BREAST, LEFT 1993   Lumpectomy, adjuvant XRT, then tamoxifen.  Gets annual mammograms, no hx of abnormals--as of 2016, patient has chosen to stop getting screening mammograms.  . DEPRESSION, SITUATIONAL 03/26/2009   Qualifier: Diagnosis of  By: Linda Hedges MD, [place] 06/07/2007   Qualifier: Diagnosis of  By: Danny Lawless CMA, [place]    . HYPERLIPIDEMIA 06/07/2007   Qualifier: Diagnosis of  By: Danny Lawless CMA, [place]    . HYPERTENSION 06/07/2007   Qualifier: Diagnosis of  By:  Danny Lawless CMA, [place]    . HYSTERECTOMY, HX OF 06/07/2007   Qualifier: Diagnosis of  By: Danny Lawless CMA, [place]    . Personal history of surgery to other organs 06/07/2007   Centricity Description: BLADDER REPAIR, HX OF Qualifier: Diagnosis of  By: [place], [place]   Centricity Description: LUMPECTOMY, BREAST, HX OF Qualifier: Diagnosis of  By: [place], [place]    . POSTMENOPAUSAL OSTEOPOROSIS 11/18/2010   Qualifier: Diagnosis of  By: Linda Hedges MD, Heinz Knuckles    Patient Active Problem List   Diagnosis Date Noted  . Grief reaction 04/07/2016  . Medicare annual wellness visit, subsequent 10/21/2015  . Polypharmacy 10/03/2014  . Cervical muscle pain 02/12/2014  . Cerumen impaction 01/17/2014  . Routine health maintenance 03/08/2012  . POSTMENOPAUSAL OSTEOPOROSIS 11/18/2010  . DEPRESSION, SITUATIONAL 03/26/2009  . Hyperlipidemia 06/07/2007  . GLAUCOMA 06/07/2007  . Essential hypertension 06/07/2007   Home Medication(s) Prior to Admission medications   Medication Sig Start Date End Date Taking? Authorizing Provider  ALPHAGAN P 0.1 % SOLN  06/09/16   [provider]  amLODipine (NORVASC) 10 MG tablet TAKE 1 TABLET BY MOUTH EVERY DAY 04/08/20   McGowen, Adrian Blackwater, MD  AZOPT 1 % ophthalmic suspension Place 1 drop into both eyes 2 (two) times daily. 09/04/16   [provider]  hydrochlorothiazide (HYDRODIURIL) 25 MG tablet TAKE 1 TABLET BY MOUTH EVERY DAY 05/06/20   McGowen, Adrian Blackwater, MD  hydrOXYzine (ATARAX/VISTARIL) 25 MG tablet Take 1 tablet at bedtime as  needed for insomnia Patient not taking: Reported on 02/13/2019 11/09/16   Tammi Sou, MD  latanoprost (XALATAN) 0.005 % ophthalmic solution Place 1 drop into both eyes at bedtime.    [provider]  losartan (COZAAR) 50 MG tablet TAKE 2 TABLETS BY MOUTH EVERY DAY 08/16/19   McGowen, Adrian Blackwater, MD  metoprolol succinate (TOPROL-XL) 25 MG 24 hr tablet Take 1 tablet (25 mg total) by mouth daily. 05/01/20   McGowen, Adrian Blackwater,  MD  Multiple Vitamins-Minerals (ICAPS AREDS FORMULA PO) Take 1 capsule by mouth 2 (two) times daily.     [provider]  sertraline (ZOLOFT) 50 MG tablet TAKE 1 TABLET BY MOUTH EVERYDAY AT BEDTIME 05/29/20   McGowen, Adrian Blackwater, MD  timolol (TIMOPTIC) 0.5 % ophthalmic solution  06/12/16   [provider]                                                                                                                                    Past Surgical History Past Surgical History:  Procedure Laterality Date  . ABDOMINAL HYSTERECTOMY     Benign reasons per pt, but she can't recall exactly what dx.  Ovaries and tubes are still in.    Marland Kitchen BLADDER REPAIR    . BREAST SURGERY  1993   lumpectomy w/adjuvant XRT  . EYE SURGERY  2011   cataract surgery [facility]   . TRANSTHORACIC ECHOCARDIOGRAM  10/09/2016   EF 60-65%, normal wall motion, grd I DD, mild/mod aortic regurg, mild mitral regurg.   Family History Family History  Problem Relation Age of Onset  . Hyperlipidemia Mother   . Hypertension Mother   . Cervical cancer Mother   . Coronary artery disease Mother   . Depression Father   . Colon cancer Neg Hx   . Diabetes Neg Hx     Social History Social History   Tobacco Use  . Smoking status: Never Smoker  . Smokeless tobacco: Never Used  Vaping Use  . Vaping Use: Never used  Substance Use Topics  . Alcohol use: No  . Drug use: No   Allergies Atorvastatin and Prednisone  Review of Systems Review of Systems All other systems are reviewed and are negative for acute change except as noted in the HPI  Physical Exam Vital Signs  I have reviewed the triage vital signs BP (!) 112/55   Pulse 63   Temp 99.1 F (37.3 C) (Oral)   Resp 16   SpO2 95%   Physical Exam Vitals reviewed.  Constitutional:      General: She is not in acute distress.    Appearance: She is well-developed. She is not diaphoretic.  HENT:     Head: Normocephalic and atraumatic.     Right Ear:  External ear normal.     Left Ear: External ear normal.     Nose: Nose normal.  Eyes:     General:  No scleral icterus.    Conjunctiva/sclera: Conjunctivae normal.  Neck:     Trachea: Phonation normal.  Cardiovascular:     Rate and Rhythm: Normal rate and regular rhythm.  Pulmonary:     Effort: Pulmonary effort is normal. No respiratory distress.     Breath sounds: No stridor.  Abdominal:     General: There is no distension.  Musculoskeletal:        General: Normal range of motion.     Cervical back: Normal range of motion.     Right ankle: Tenderness present over the ATF ligament. Normal range of motion.     Right Achilles Tendon: No tenderness.  Neurological:     Mental Status: She is alert and oriented to person, place, and time.  Psychiatric:        Behavior: Behavior normal.     ED Results and Treatments Labs (all labs ordered are listed, but only abnormal results are displayed) Labs Reviewed - No data to display                                                                                                                       EKG  EKG Interpretation  Date/Time:    Ventricular Rate:    PR Interval:    QRS Duration:   QT Interval:    QTC Calculation:   R Axis:     Text Interpretation:        Radiology No results found.  Pertinent labs & imaging results that were available during my care of the patient were reviewed by me and considered in my medical decision making (see chart for details).  Medications Ordered in ED Medications  acetaminophen (TYLENOL) tablet 1,000 mg (1,000 mg Oral Given 06/19/20 0240)                                                                                                                                    Procedures Procedures  (including critical care time)  Medical Decision Making / ED Course I have reviewed the nursing notes for this encounter and the patient's prior records (if available in EHR or on provided  paperwork).   Amy Turner was evaluated in Emergency Department on 06/19/2020 for the symptoms described in the history of present illness. She was evaluated in the context of the global COVID-19 pandemic, which necessitated consideration that the patient might be at risk for  infection with the SARS-CoV-2 virus that causes COVID-19. Institutional protocols and algorithms that pertain to the evaluation of patients at risk for COVID-19 are in a state of rapid change based on information released by regulatory bodies including the CDC and federal and state organizations. These policies and algorithms were followed during the patient's care in the ED.  No evidence suggesting septic arthritis. Patient has tenderness over the ATFL. Likely sprain vs tendinitis.  Supportive management recommended.      Final Clinical Impression(s) / ED Diagnoses Final diagnoses:  Sprain of anterior talofibular ligament of right ankle, initial encounter    The patient appears reasonably screened and/or stabilized for discharge and I doubt any other medical condition or other [facility] requiring further screening, evaluation, or treatment in the ED at this time prior to discharge. Safe for discharge with strict return precautions.  Disposition: Discharge  Condition: Good  I have discussed the results, Dx and Tx plan with the patient/family who expressed understanding and agree(s) with the plan. Discharge instructions discussed at length. The patient/family was given strict return precautions who verbalized understanding of the instructions. No further questions at time of discharge.    ED Discharge Orders    None        Follow Up: Tammi Sou, MD [address] [place] [address] [phone]  Schedule an appointment as soon as possible for a visit  If symptoms do not improve in 1-2 weeks     This chart was dictated using voice recognition software.  Despite best efforts to proofread,   errors can occur which can change the documentation meaning.   Fatima Blank, MD 06/19/20 0730

## 2020-06-19 NOTE — Discharge Instructions (Addendum)
You may use Tylenol (1000 mg every 8 hours as needed) and/or topical creams such as Arnica for additional pain control.

## 2020-06-20 NOTE — Telephone Encounter (Signed)
Reviewed ED visit from 06/19/20 and it is a relief that it appears she simply sustained an ankle sprain. Signed:  Crissie Sickles, MD           06/20/2020

## 2020-06-28 ENCOUNTER — Other Ambulatory Visit: Payer: Self-pay

## 2020-07-01 ENCOUNTER — Encounter: Payer: Self-pay | Admitting: Family Medicine

## 2020-07-01 ENCOUNTER — Ambulatory Visit (INDEPENDENT_AMBULATORY_CARE_PROVIDER_SITE_OTHER): Payer: Medicare Other | Admitting: Family Medicine

## 2020-07-01 ENCOUNTER — Other Ambulatory Visit: Payer: Self-pay

## 2020-07-01 VITALS — BP 143/73 | HR 65 | Temp 98.2°F | Resp 16 | Ht 60.0 in | Wt 145.0 lb

## 2020-07-01 DIAGNOSIS — Z23 Encounter for immunization: Secondary | ICD-10-CM

## 2020-07-01 DIAGNOSIS — S93401D Sprain of unspecified ligament of right ankle, subsequent encounter: Secondary | ICD-10-CM | POA: Diagnosis not present

## 2020-07-01 NOTE — Progress Notes (Signed)
OFFICE VISIT  07/01/2020  CC:  Chief Complaint  Patient presents with  . Follow-up    ED visit from 9/22, right ankle pain    HPI:    Patient is a 86 y.o. Caucasian female who presents for f/u ED visit for R ankle pain. [facility] ED visit 06/19/20 (12 days ago) after having had several days of worsening R ankle pain. Tripped on a little stool in her home-fell forward into a dresser and ? Twisted/hurt that ankle in the week or two prior to presenting to the ED.  She did not actually fall.  X-ray R ankle normal 06/17/20 (she initially went to [facility] 9[facility]20 for this but left early b/c of long wait, but did end up getting her x-ray). Dx from 06/19/20 visit was ankle sprain, supportive care measures emphasized.  INTERIM HX: Doing well, minimal pain with ambulation, has been wearing cam walker some but also walking some w/out it, uses walker some and otherwise uses furniture in home for support/balance when walking.  Not taking any pain med, icing it, or elevating it anymore. No swelling.      HTN: she takes 4 bp meds->amlodipine, losartan, hctz, and metoprolol.    Past Medical History:  Diagnosis Date  . Aortic insufficiency 09/2016   mild/mod aortic insufficiency, and mild mitral insufficiency.--Repeat echo 1 yr.  . Baker's cyst of knee 02/25/2012   LE Venous Doppler May 30, '13 - non-vascular swelling/mass left popliteal fossa c/w Baker's cyst   . CARCINOMA, BREAST, LEFT 1993   Lumpectomy, adjuvant XRT, then tamoxifen.  Gets annual mammograms, no hx of abnormals--as of 2016, patient has chosen to stop getting screening mammograms.  . DEPRESSION, SITUATIONAL 03/26/2009   Qualifier: Diagnosis of  By: Linda Hedges MD, [place] 06/07/2007   Qualifier: Diagnosis of  By: Danny Lawless CMA, [place]    . HYPERLIPIDEMIA 06/07/2007   Qualifier: Diagnosis of  By: Danny Lawless CMA, [place]    . HYPERTENSION 06/07/2007   Qualifier: Diagnosis of  By: Danny Lawless CMA, [place]    . HYSTERECTOMY, HX OF 06/07/2007    Qualifier: Diagnosis of  By: Danny Lawless CMA, [place]    . Personal history of surgery to other organs 06/07/2007   Centricity Description: BLADDER REPAIR, HX OF Qualifier: Diagnosis of  By: [place], [place]   Centricity Description: LUMPECTOMY, BREAST, HX OF Qualifier: Diagnosis of  By: [place], [place]    . POSTMENOPAUSAL OSTEOPOROSIS 11/18/2010   Qualifier: Diagnosis of  By: Linda Hedges MD, Heinz Knuckles     Past Surgical History:  Procedure Laterality Date  . ABDOMINAL HYSTERECTOMY     Benign reasons per pt, but she can't recall exactly what dx.  Ovaries and tubes are still in.    Marland Kitchen BLADDER REPAIR    . BREAST SURGERY  1993   lumpectomy w/adjuvant XRT  . EYE SURGERY  2011   cataract surgery [facility]   . TRANSTHORACIC ECHOCARDIOGRAM  10/09/2016   EF 60-65%, normal wall motion, grd I DD, mild/mod aortic regurg, mild mitral regurg.    Outpatient Medications Prior to Visit  Medication Sig Dispense Refill  . ALPHAGAN P 0.1 % SOLN     . amLODipine (NORVASC) 10 MG tablet TAKE 1 TABLET BY MOUTH EVERY DAY 30 tablet 5  . AZOPT 1 % ophthalmic suspension Place 1 drop into both eyes 2 (two) times daily.    . hydrochlorothiazide (HYDRODIURIL) 25 MG tablet TAKE 1 TABLET BY MOUTH EVERY DAY 90 tablet 0  . latanoprost (  XALATAN) 0.005 % ophthalmic solution Place 1 drop into both eyes at bedtime.    Marland Kitchen losartan (COZAAR) 50 MG tablet TAKE 2 TABLETS BY MOUTH EVERY DAY 180 tablet 1  . metoprolol succinate (TOPROL-XL) 25 MG 24 hr tablet Take 1 tablet (25 mg total) by mouth daily. 90 tablet 1  . Multiple Vitamins-Minerals (ICAPS AREDS FORMULA PO) Take 1 capsule by mouth 2 (two) times daily.     Marland Kitchen ROCKLATAN 0.02-0.005 % SOLN Apply 1 drop to eye at bedtime.    . sertraline (ZOLOFT) 50 MG tablet TAKE 1 TABLET BY MOUTH EVERYDAY AT BEDTIME 30 tablet 5  . timolol (TIMOPTIC) 0.5 % ophthalmic solution     . hydrOXYzine (ATARAX/VISTARIL) 25 MG tablet Take 1 tablet at bedtime as needed for insomnia (Patient not taking: Reported  on 02/13/2019) 30 tablet 1   No facility-administered medications prior to visit.    Allergies  Allergen Reactions  . Atorvastatin Other (See Comments)    Leg myalgias  . Prednisone Other (See Comments)    Various cognitive/psychiatric side effects    ROS As per HPI  PE: Vitals with BMI 07/01/2020 06/19/2020 06/19/2020  Height 5\' 0"  - -  Weight 145 lbs - -  BMI 38.10 - -  Systolic 175 102 585  Diastolic 73 55 277  Pulse 65 63 72  Some encounter information is confidential and restricted. Go to Review Flowsheets activity to see all data.     Gen: Alert, well appearing.  Patient is oriented to person, place, time, and situation. AFFECT: pleasant, lucid thought and speech. R ankle w/out swelling, bruising, or erythema. No tenderness to touch in ankle or foot. Full ROM of right ankle: passive and active. No excessive laxity upon talar tilt or anterior drawer sign. She walks fairly stable with cam walker off in room.  LABS:    Chemistry      Component Value Date/Time   NA 143 08/16/2019 0925   K 3.4 (L) 08/16/2019 0925   CL 107 08/16/2019 0925   CO2 27 08/16/2019 0925   BUN 17 08/16/2019 0925   CREATININE 0.80 08/16/2019 0925   CREATININE 0.94 (H) 02/22/2019 1420      Component Value Date/Time   CALCIUM 9.2 08/16/2019 0925   ALKPHOS 90 09/29/2016 1127   AST 16 09/29/2016 1127   ALT 12 09/29/2016 1127   BILITOT 0.8 09/29/2016 1127      IMPRESSION AND PLAN:  Right ankle sprain; resolving nicely. OK to stay out of cam walker. Discussed home ankle ROM exercises against resistance for 15 min bid, use walker and/or furniture for support while walking until ankle feels back to completely normal. She declined formal PT today. She wants for "play it by ear" for f/u for this problem. Handout explaining theraband use with diagrams was given to pt today.  An After Visit Summary was printed and given to the patient.  FOLLOW UP: Return if symptoms worsen or fail to  improve.  Signed:  Crissie Sickles, MD           07/01/2020

## 2020-07-01 NOTE — Addendum Note (Signed)
Addended by: Deveron Furlong D on: 07/01/2020 11:16 AM   Modules accepted: Orders

## 2020-07-01 NOTE — Patient Instructions (Signed)
Use theraband as instructed---see diagram/handout

## 2020-08-09 ENCOUNTER — Other Ambulatory Visit: Payer: Self-pay | Admitting: Family Medicine

## 2020-08-21 ENCOUNTER — Telehealth: Payer: Self-pay | Admitting: Family Medicine

## 2020-08-21 NOTE — Telephone Encounter (Signed)
Patient would like to speak with someone regarding the Covid vaccination. She asked if it is absolutely necessary for her to get it. Please call patient to advise.

## 2020-08-21 NOTE — Telephone Encounter (Signed)
She does NOT absolutely have to get the covid vaccine.  It is RECOMMENDED that she get it due to her having a higher risk of complications if she were to get a covid infection.  If she does not feel comfortable getting the vaccine then she should not get it.--thx

## 2020-08-21 NOTE — Telephone Encounter (Signed)
Please advise if any known exclusions for pt

## 2020-08-23 NOTE — Telephone Encounter (Signed)
Pt has chosen to wait until she has appointment with Dr. Anitra Lauth before doing any vaccine

## 2020-09-12 ENCOUNTER — Other Ambulatory Visit: Payer: Self-pay

## 2020-09-13 ENCOUNTER — Ambulatory Visit (INDEPENDENT_AMBULATORY_CARE_PROVIDER_SITE_OTHER): Payer: Medicare Other | Admitting: Family Medicine

## 2020-09-13 ENCOUNTER — Encounter: Payer: Self-pay | Admitting: Family Medicine

## 2020-09-13 VITALS — BP 123/63 | HR 64 | Temp 97.6°F | Resp 16 | Ht 60.0 in | Wt 139.8 lb

## 2020-09-13 DIAGNOSIS — F411 Generalized anxiety disorder: Secondary | ICD-10-CM

## 2020-09-13 DIAGNOSIS — I10 Essential (primary) hypertension: Secondary | ICD-10-CM

## 2020-09-13 LAB — BASIC METABOLIC PANEL
BUN: 23 mg/dL (ref 6–23)
CO2: 28 mEq/L (ref 19–32)
Calcium: 9.5 mg/dL (ref 8.4–10.5)
Chloride: 106 mEq/L (ref 96–112)
Creatinine, Ser: 0.99 mg/dL (ref 0.40–1.20)
GFR: 49.59 mL/min — ABNORMAL LOW (ref >60.00)
Glucose, Bld: 100 mg/dL — ABNORMAL HIGH (ref 70–99)
Potassium: 3.7 mEq/L (ref 3.5–5.1)
Sodium: 141 mEq/L (ref 135–145)

## 2020-09-13 MED ORDER — AMLODIPINE BESYLATE 10 MG PO TABS
10.0000 mg | ORAL_TABLET | Freq: Every day | ORAL | 3 refills | Status: DC
Start: 2020-09-13 — End: 2021-11-14

## 2020-09-13 MED ORDER — LOSARTAN POTASSIUM 50 MG PO TABS
100.0000 mg | ORAL_TABLET | Freq: Every day | ORAL | 3 refills | Status: DC
Start: 2020-09-13 — End: 2021-10-10

## 2020-09-13 MED ORDER — HYDROCHLOROTHIAZIDE 25 MG PO TABS
25.0000 mg | ORAL_TABLET | Freq: Every day | ORAL | 3 refills | Status: DC
Start: 2020-09-13 — End: 2021-09-16

## 2020-09-13 NOTE — Progress Notes (Signed)
OFFICE VISIT  09/13/2020  CC:  Chief Complaint  Patient presents with  . Follow-up    RCI, pt is not fasting    HPI:    Patient is a 86 y.o. Caucasian female who presents for 6 mo f/u HTN and GAD.  HTN: goal bp for her is 140s/80s.  She's feeling well, says "I'm making it alright but you tell me". No home bp monitoring.  Goes to church. Has a family gathering for x-mas dinner planned tomorrow. Likes to color in adult coloring books, enjoys reading and cooking. Sleeping well, eating well. No pains.  Past Medical History:  Diagnosis Date  . Aortic insufficiency 09/2016   mild/mod aortic insufficiency, and mild mitral insufficiency.--Repeat echo 1 yr.  . Baker's cyst of knee 02/25/2012   LE Venous Doppler May 30, '13 - non-vascular swelling/mass left popliteal fossa c/w Baker's cyst   . CARCINOMA, BREAST, LEFT 1993   Lumpectomy, adjuvant XRT, then tamoxifen.  Gets annual mammograms, no hx of abnormals--as of 2016, patient has chosen to stop getting screening mammograms.  . DEPRESSION, SITUATIONAL 03/26/2009   Qualifier: Diagnosis of  By: Linda Hedges MD, [place] 06/07/2007   Qualifier: Diagnosis of  By: Danny Lawless CMA, [place]    . HYPERLIPIDEMIA 06/07/2007   Qualifier: Diagnosis of  By: Danny Lawless CMA, [place]    . HYPERTENSION 06/07/2007   Qualifier: Diagnosis of  By: Danny Lawless CMA, [place]    . HYSTERECTOMY, HX OF 06/07/2007   Qualifier: Diagnosis of  By: Danny Lawless CMA, [place]    . Personal history of surgery to other organs 06/07/2007   Centricity Description: BLADDER REPAIR, HX OF Qualifier: Diagnosis of  By: [place], [place]   Centricity Description: LUMPECTOMY, BREAST, HX OF Qualifier: Diagnosis of  By: [place], [place]    . POSTMENOPAUSAL OSTEOPOROSIS 11/18/2010   Qualifier: Diagnosis of  By: Linda Hedges MD, [place] Knuckles     Past Surgical History:  Procedure Laterality Date  . ABDOMINAL HYSTERECTOMY     Benign reasons per pt, but she can't recall exactly what dx.  Ovaries and  tubes are still in.    Marland Kitchen BLADDER REPAIR    . BREAST SURGERY  1993   lumpectomy w/adjuvant XRT  . EYE SURGERY  2011   cataract surgery [facility]   . TRANSTHORACIC ECHOCARDIOGRAM  10/09/2016   EF 60-65%, normal wall motion, grd I DD, mild/mod aortic regurg, mild mitral regurg.    Outpatient Medications Prior to Visit  Medication Sig Dispense Refill  . ALPHAGAN P 0.1 % SOLN     . AZOPT 1 % ophthalmic suspension Place 1 drop into both eyes 2 (two) times daily.    Marland Kitchen latanoprost (XALATAN) 0.005 % ophthalmic solution Place 1 drop into both eyes at bedtime.    . metoprolol succinate (TOPROL-XL) 25 MG 24 hr tablet Take 1 tablet (25 mg total) by mouth daily. 90 tablet 1  . Multiple Vitamins-Minerals (ICAPS AREDS FORMULA PO) Take 1 capsule by mouth 2 (two) times daily.     Marland Kitchen ROCKLATAN 0.02-0.005 % SOLN Apply 1 drop to eye at bedtime.    . sertraline (ZOLOFT) 50 MG tablet TAKE 1 TABLET BY MOUTH EVERYDAY AT BEDTIME 30 tablet 5  . timolol (TIMOPTIC) 0.5 % ophthalmic solution     . amLODipine (NORVASC) 10 MG tablet TAKE 1 TABLET BY MOUTH EVERY DAY 30 tablet 5  . hydrochlorothiazide (HYDRODIURIL) 25 MG tablet TAKE 1 TABLET BY MOUTH EVERY DAY 90 tablet 0  . losartan (COZAAR)  50 MG tablet TAKE 2 TABLETS BY MOUTH EVERY DAY 180 tablet 1  . hydrOXYzine (ATARAX/VISTARIL) 25 MG tablet Take 1 tablet at bedtime as needed for insomnia (Patient not taking: No sig reported) 30 tablet 1   No facility-administered medications prior to visit.    Allergies  Allergen Reactions  . Atorvastatin Other (See Comments)    Leg myalgias  . Prednisone Other (See Comments)    Various cognitive/psychiatric side effects    ROS As per HPI  PE: Vitals with BMI 09/13/2020 07/01/2020 06/19/2020  Height 5\' 0"  5\' 0"  -  Weight 139 lbs 13 oz 145 lbs -  BMI 74.9 35.52 -  Systolic 174 715 953  Diastolic 63 73 55  Pulse 64 65 63  Some encounter information is confidential and restricted. Go to Review Flowsheets activity to see  all data.    Gen: Alert, well appearing.  Patient is oriented to person, place, time, and situation. CV: RRR, no m/r/g.   LUNGS: CTA bilat, nonlabored resps, good aeration in all lung fields. EXT: no clubbing or cyanosis.  no edema.    LABS:    Chemistry      Component Value Date/Time   NA 143 08/16/2019 0925   K 3.4 (L) 08/16/2019 0925   CL 107 08/16/2019 0925   CO2 27 08/16/2019 0925   BUN 17 08/16/2019 0925   CREATININE 0.80 08/16/2019 0925   CREATININE 0.94 (H) 02/22/2019 1420      Component Value Date/Time   CALCIUM 9.2 08/16/2019 0925   ALKPHOS 90 09/29/2016 1127   AST 16 09/29/2016 1127   ALT 12 09/29/2016 1127   BILITOT 0.8 09/29/2016 1127     Lab Results  Component Value Date   WBC 10.8 (H) 09/29/2016   HGB 14.5 09/29/2016   HCT 43.0 09/29/2016   MCV 95.3 09/29/2016   PLT 277.0 09/29/2016     IMPRESSION AND PLAN:  1) HTN: good control. Con amlod 10, hctz 25, losartan 100mg  qd, toprol xl 25 qd. Lytes/cr today.  2) GAD: doing well on sertraline 50mg  qhs long term. Continue this.  An After Visit Summary was printed and given to the patient.  FOLLOW UP: Return in about 6 months (around 03/14/2021) for routine chronic illness f/u.  Signed:  Crissie Sickles, MD           09/13/2020

## 2020-10-22 ENCOUNTER — Other Ambulatory Visit: Payer: Self-pay | Admitting: Family Medicine

## 2020-12-17 ENCOUNTER — Telehealth: Payer: Self-pay | Admitting: Family Medicine

## 2020-12-17 NOTE — Telephone Encounter (Signed)
Attempted to schedule AWV. Unable to LVM.  Will try at later time.  

## 2021-01-03 ENCOUNTER — Other Ambulatory Visit: Payer: Self-pay | Admitting: Family Medicine

## 2021-01-26 ENCOUNTER — Other Ambulatory Visit: Payer: Self-pay | Admitting: Family Medicine

## 2021-03-14 ENCOUNTER — Encounter: Payer: Self-pay | Admitting: Family Medicine

## 2021-03-14 ENCOUNTER — Other Ambulatory Visit: Payer: Self-pay

## 2021-03-14 ENCOUNTER — Ambulatory Visit (INDEPENDENT_AMBULATORY_CARE_PROVIDER_SITE_OTHER): Payer: Medicare Other | Admitting: Family Medicine

## 2021-03-14 VITALS — BP 113/64 | HR 67 | Temp 99.3°F | Resp 16 | Ht 60.0 in | Wt 137.6 lb

## 2021-03-14 DIAGNOSIS — Z23 Encounter for immunization: Secondary | ICD-10-CM

## 2021-03-14 DIAGNOSIS — F411 Generalized anxiety disorder: Secondary | ICD-10-CM | POA: Diagnosis not present

## 2021-03-14 DIAGNOSIS — I10 Essential (primary) hypertension: Secondary | ICD-10-CM

## 2021-03-14 LAB — BASIC METABOLIC PANEL
BUN: 20 mg/dL (ref 6–23)
CO2: 28 mEq/L (ref 19–32)
Calcium: 9.4 mg/dL (ref 8.4–10.5)
Chloride: 104 mEq/L (ref 96–112)
Creatinine, Ser: 0.91 mg/dL (ref 0.40–1.20)
GFR: 54.68 mL/min — ABNORMAL LOW (ref >60.00)
Glucose, Bld: 102 mg/dL — ABNORMAL HIGH (ref 70–99)
Potassium: 4 mEq/L (ref 3.5–5.1)
Sodium: 141 mEq/L (ref 135–145)

## 2021-03-14 MED ORDER — METOPROLOL SUCCINATE ER 25 MG PO TB24
1.0000 | ORAL_TABLET | Freq: Every day | ORAL | 3 refills | Status: DC
Start: 2021-03-14 — End: 2021-12-30

## 2021-03-14 MED ORDER — TETANUS-DIPHTH-ACELL PERTUSSIS 5-2-15.5 LF-MCG/0.5 IM SUSP
0.5000 mL | Freq: Once | INTRAMUSCULAR | 0 refills | Status: AC
Start: 2021-03-14 — End: 2021-03-14

## 2021-03-14 NOTE — Progress Notes (Signed)
OFFICE VISIT  03/14/2021  CC:  Chief Complaint  Patient presents with   Follow-up    RCI; pt is fasting   HPI:    Patient is a 87 y.o. Caucasian female who presents for 6 mo f/u HTN and GAD. A/P as of last visit: "1) HTN: good control. Con amlod 10, hctz 25, losartan 100mg  qd, toprol xl 25 qd. Lytes/cr today.   2) GAD: doing well on sertraline 50mg  qhs long term. Continue this"  INTERIM HX: She is feeling well. Remaining active and completely independent. Son lives with her.   No home bp monitoring.  Compliant with bp meds. No HAs, dizziness, CP, SOB, palpitations, or changes in LEs.  GAD: feels good, not anxious or panicky.  Mood is good.  Takes sertraline 50mg  qd.   Past Medical History:  Diagnosis Date   Aortic insufficiency 09/2016   mild/mod aortic insufficiency, and mild mitral insufficiency.--Repeat echo 1 yr.   Baker's cyst of knee 02/25/2012   LE Venous Doppler May 30, '13 - non-vascular swelling/mass left popliteal fossa c/w Baker's cyst    CARCINOMA, BREAST, LEFT 1993   Lumpectomy, adjuvant XRT, then tamoxifen.  Gets annual mammograms, no hx of abnormals--as of 2016, patient has chosen to stop getting screening mammograms.   DEPRESSION, SITUATIONAL 03/26/2009   Qualifier: Diagnosis of  By: Linda Hedges MD, [place]inz Knuckles    GLAUCOMA 06/07/2007   Qualifier: Diagnosis of  By: [place], [place]     HYPERLIPIDEMIA 06/07/2007   Qualifier: Diagnosis of  By: [place], [place]     HYPERTENSION 06/07/2007   Qualifier: Diagnosis of  By: [place], [place]     HYSTERECTOMY, HX OF 06/07/2007   Qualifier: Diagnosis of  By: Danny Lawless CMA, [place]     Personal history of surgery to other organs 06/07/2007   Centricity Description: BLADDER REPAIR, HX OF Qualifier: Diagnosis of  By: Danny Lawless CMA, [place]   Centricity Description: LUMPECTOMY, BREAST, HX OF Qualifier: Diagnosis of  By: [place], [place]     POSTMENOPAUSAL OSTEOPOROSIS 11/18/2010   Qualifier: Diagnosis of  By: Linda Hedges MD, Heinz Knuckles     Past Surgical History:  Procedure Laterality Date   ABDOMINAL HYSTERECTOMY     Benign reasons per pt, but she can't recall exactly what dx.  Ovaries and tubes are still in.     BLADDER REPAIR     BREAST SURGERY  1993   lumpectomy w/adjuvant XRT   EYE SURGERY  2011   cataract surgery [facility]    TRANSTHORACIC ECHOCARDIOGRAM  10/09/2016   EF 60-65%, normal wall motion, grd I DD, mild/mod aortic regurg, mild mitral regurg.    Outpatient Medications Prior to Visit  Medication Sig Dispense Refill   ALPHAGAN P 0.1 % SOLN      amLODipine (NORVASC) 10 MG tablet Take 1 tablet (10 mg total) by mouth daily. 90 tablet 3   BRINZOLAMIDE OP Apply 1 % to eye in the morning and at bedtime.     hydrochlorothiazide (HYDRODIURIL) 25 MG tablet Take 1 tablet (25 mg total) by mouth daily. 90 tablet 3   latanoprost (XALATAN) 0.005 % ophthalmic solution Place 1 drop into both eyes at bedtime.     losartan (COZAAR) 50 MG tablet Take 2 tablets (100 mg total) by mouth daily. 180 tablet 3   Multiple Vitamins-Minerals (ICAPS AREDS FORMULA PO) Take 1 capsule by mouth 2 (two) times daily.      ROCKLATAN 0.02-0.005 % SOLN Apply 1 drop to eye at bedtime.  sertraline (ZOLOFT) 50 MG tablet TAKE 1 TABLET BY MOUTH EVERYDAY AT BEDTIME 90 tablet 0   timolol (TIMOPTIC) 0.5 % ophthalmic solution      metoprolol succinate (TOPROL-XL) 25 MG 24 hr tablet TAKE 1 TABLET BY MOUTH EVERY DAY 90 tablet 1   hydrOXYzine (ATARAX/VISTARIL) 25 MG tablet Take 1 tablet at bedtime as needed for insomnia (Patient not taking: No sig reported) 30 tablet 1   AZOPT 1 % ophthalmic suspension Place 1 drop into both eyes 2 (two) times daily.     No facility-administered medications prior to visit.    Allergies  Allergen Reactions   Atorvastatin Other (See Comments)    Leg myalgias   Prednisone Other (See Comments)    Various cognitive/psychiatric side effects    ROS As per HPI  PE: Vitals with BMI 03/14/2021 09/13/2020 07/01/2020   Height 5\' 0"  5\' 0"  5\' 0"   Weight 137 lbs 10 oz 139 lbs 13 oz 145 lbs  BMI 26.87 12.8 78.67  Systolic 672 094 709  Diastolic 64 63 73  Pulse 67 64 65  Some encounter information is confidential and restricted. Go to Review Flowsheets activity to see all data.    Gen: Alert, well appearing.  Patient is oriented to person, place, time, and situation. AFFECT: pleasant, lucid thought and speech. CV: RRR, no m/r/g.   LUNGS: CTA bilat, nonlabored resps, good aeration in all lung fields. EXT: no clubbing or cyanosis.  no edema.    LABS:  Lab Results  Component Value Date   TSH 3.17 09/29/2016   Lab Results  Component Value Date   WBC 10.8 (H) 09/29/2016   HGB 14.5 09/29/2016   HCT 43.0 09/29/2016   MCV 95.3 09/29/2016   PLT 277.0 09/29/2016   Lab Results  Component Value Date   CREATININE 0.99 09/13/2020   BUN 23 09/13/2020   NA 141 09/13/2020   K 3.7 09/13/2020   CL 106 09/13/2020   CO2 28 09/13/2020   Lab Results  Component Value Date   ALT 12 09/29/2016   AST 16 09/29/2016   ALKPHOS 90 09/29/2016   BILITOT 0.8 09/29/2016   Lab Results  Component Value Date   CHOL 176 01/17/2014   Lab Results  Component Value Date   HDL 57.60 01/17/2014   Lab Results  Component Value Date   LDLCALC 91 01/17/2014   Lab Results  Component Value Date   TRIG 139.0 01/17/2014   Lab Results  Component Value Date   CHOLHDL 3 01/17/2014    IMPRESSION AND PLAN:  1) HTN: well controlled. Cont toprol xl 25 qd, amlod 10 qd, losartan 100 qd, and hctz 25 qd.  2) GAD: doing well on sertraline 50mg  qd longterm. Continue this med indefinitely.  3) Preventative health care: Tdap due->rx given to pt to take to pharmacy. Shingrix discussed->she declines.  An After Visit Summary was printed and given to the patient.  FOLLOW UP: Return in about 6 months (around 09/13/2021) for routine chronic illness f/u.  Signed:  Crissie Sickles, MD           03/14/2021

## 2021-05-19 ENCOUNTER — Telehealth: Payer: Self-pay | Admitting: Family Medicine

## 2021-05-19 NOTE — Telephone Encounter (Signed)
Copied from [place]. Topic: Medicare AWV >> May 19, 2021  2:10 PM Cher Nakai R wrote: Reason for CRM: Left message re: [facility] schedule change - New appointment May 22, 2021 at 12:30 by phone

## 2021-05-21 ENCOUNTER — Ambulatory Visit: Payer: Medicare Other

## 2021-05-22 ENCOUNTER — Ambulatory Visit (INDEPENDENT_AMBULATORY_CARE_PROVIDER_SITE_OTHER): Payer: Medicare Other | Admitting: *Deleted

## 2021-05-22 DIAGNOSIS — Z0000 Encounter for general adult medical examination without abnormal findings: Secondary | ICD-10-CM

## 2021-05-22 NOTE — Progress Notes (Signed)
Subjective:   Amy Turner is a 87 y.o. female who presents for Medicare Annual (Subsequent) preventive examination.  I connected with  Amy Turner on 05/22/21 by a telephone enabled telemedicine application and verified that I am speaking with the correct person using two identifiers.   I discussed the limitations of evaluation and management by telemedicine. The patient expressed understanding and agreed to proceed.   Review of Systems     NA Cardiac Risk Factors include: advanced age (>68mn, >>66women);hypertension     Objective:    Today's Vitals   There is no height or weight on file to calculate BMI.  Advanced Directives 06/17/2020 06/04/2017 10/13/2016  Does Patient Have a Medical Advance Directive? No Yes No  Type of Advance Directive - Living will;Healthcare Power of Attorney -  CM[place]rowin Chart? - No - copy requested -  Would patient like information on creating a medical advance directive? - - Yes (ED - Information included in AVS)  Some encounter information is confidential and restricted. Go to Review Flowsheets activity to see all data.    Current Medications (verified) Outpatient Encounter Medications as of 05/22/2021  Medication Sig   ALPHAGAN P 0.1 % SOLN    amLODipine (NORVASC) 10 MG tablet Take 1 tablet (10 mg total) by mouth daily.   BRINZOLAMIDE OP Apply 1 % to eye in the morning and at bedtime.   hydrochlorothiazide (HYDRODIURIL) 25 MG tablet Take 1 tablet (25 mg total) by mouth daily.   latanoprost (XALATAN) 0.005 % ophthalmic solution Place 1 drop into both eyes at bedtime.   losartan (COZAAR) 50 MG tablet Take 2 tablets (100 mg total) by mouth daily.   metoprolol succinate (TOPROL-XL) 25 MG 24 hr tablet Take 1 tablet (25 mg total) by mouth daily.   Multiple Vitamins-Minerals (ICAPS AREDS FORMULA PO) Take 1 capsule by mouth 2 (two) times daily.    ROCKLATAN 0.02-0.005 % SOLN Apply 1 drop to eye at bedtime.   sertraline  (ZOLOFT) 50 MG tablet TAKE 1 TABLET BY MOUTH EVERYDAY AT BEDTIME   timolol (TIMOPTIC) 0.5 % ophthalmic solution    hydrOXYzine (ATARAX/VISTARIL) 25 MG tablet Take 1 tablet at bedtime as needed for insomnia (Patient not taking: No sig reported)   No facility-administered encounter medications on file as of 05/22/2021.    Allergies (verified) Atorvastatin and Prednisone   History: Past Medical History:  Diagnosis Date   Aortic insufficiency 09/2016   mild/mod aortic insufficiency, and mild mitral insufficiency.--Repeat echo 1 yr.   Baker's cyst of knee 02/25/2012   LE Venous Doppler May 30, '13 - non-vascular swelling/mass left popliteal fossa c/w Baker's cyst    CARCINOMA, BREAST, LEFT 1993   Lumpectomy, adjuvant XRT, then tamoxifen.  Gets annual mammograms, no hx of abnormals--as of 2016, patient has chosen to stop getting screening mammograms.   DEPRESSION, SITUATIONAL 03/26/2009   Qualifier: Diagnosis of  By: NLinda HedgesMD, MHeinz Knuckles   GLAUCOMA 06/07/2007   Qualifier: Diagnosis of  By: [place]    HYPERLIPIDEMIA 06/07/2007   Qualifier: Diagnosis of  By: SDanny LawlessCMA, KBur[place]    HYPERTENSION 06/07/2007   Qualifier: Diagnosis of  By: SMount Calm KBurundi    HYSTERECTOMY, HX OF 06/07/2007   Qualifier: Diagnosis of  By: SDanny LawlessCMA, KBur[place]    Personal history of surgery to other organs 06/07/2007   Centricity Description: BLADDER REPAIR, HX OF Qualifier: Diagnosis of  By: SDanny LawlessCMA,  [place]   Centricity Description: LUMPECTOMY, BREAST, HX OF Qualifier: Diagnosis of  By: Gove, [place]     POSTMENOPAUSAL OSTEOPOROSIS 11/18/2010   Qualifier: Diagnosis of  By: Linda Hedges MD, Heinz Knuckles    Past Surgical History:  Procedure Laterality Date   ABDOMINAL HYSTERECTOMY     Benign reasons per pt, but she can't recall exactly what dx.  Ovaries and tubes are still in.     BLADDER REPAIR     BREAST SURGERY  1993   lumpectomy w/adjuvant XRT   EYE SURGERY  Dec 29, 2009   cataract surgery [facility]     TRANSTHORACIC ECHOCARDIOGRAM  10/09/2016   EF 60-65%, normal wall motion, grd I DD, mild/mod aortic regurg, mild mitral regurg.   Family History  Problem Relation Age of Onset   Hyperlipidemia Mother    Hypertension Mother    Cervical cancer Mother    Coronary artery disease Mother    Depression Father    Colon cancer Neg Hx    Diabetes Neg Hx    Social History   Socioeconomic History   Marital status: Married    Spouse name: Not on file   Number of children: 2   Years of education: 12   Highest education level: Not on file  Occupational History   Occupation: homemaker  Tobacco Use   Smoking status: Never   Smokeless tobacco: Never  Vaping Use   Vaping Use: Never used  Substance and Sexual Activity   Alcohol use: No   Drug use: No   Sexual activity: Never  Other Topics Concern   Not on file  Social History Narrative   Married Dec 29, 2048. 2 sons-'58, '68 oldest died CAD/MI; 2 grandchildren.  From [place], [place] originally.   Homemaker, retired from [employer].   Widowed in the spring of 2017.    Little gardening, handcrafts. ACP/End-of-life Issues: Does not want CPR, mechanical ventilation or heroic measures. She has a living will.   Never smoker.  No alcohol.               Social Determinants of Health   Financial Resource Strain: Low Risk    Difficulty of Paying Living Expenses: Not hard at all  Food Insecurity: No Food Insecurity   Worried About Charity fundraiser in the Last Year: Never true   [place] in the Last Year: Never true  Transportation Needs: No Transportation Needs   Lack of Transportation (Medical): No   Lack of Transportation (Non-Medical): No  Physical Activity: Insufficiently Active   Days of Exercise per Week: 3 days   Minutes of Exercise per Session: 20 min  Stress: No Stress Concern Present   Feeling of Stress : Not at all  Social Connections: Moderately Integrated   Frequency of Communication with Friends and Family: More than  three times a week   Frequency of Social Gatherings with Friends and Family: Twice a week   Attends Religious Services: More than 4 times per year   Active Member of [employer] or Organizations: Yes   Attends Archivist Meetings: 1 to 4 times per year   Marital Status: Widowed    Tobacco Counseling Counseling given: Not Answered   Clinical Intake:  Pre-visit preparation completed: Yes  Pain : No/denies pain     Nutritional Risks: None Diabetes: No  How often do you need to have someone help you when you read instructions, pamphlets, or other written materials from your doctor or pharmacy?: 1 - Never  Diabetic?  n0  Interpreter Needed?: No  Information entered by :: Leroy Kennedy LPN   Activities of Daily Living In your present state of health, do you have any difficulty performing the following activities: 05/22/2021 09/13/2020  Hearing? N Y  Vision? N N  Difficulty concentrating or making decisions? N N  Walking or climbing stairs? N N  Dressing or bathing? N N  Doing errands, shopping? N N  Preparing Food and eating ? N -  Using the Toilet? N -  In the past six months, have you accidently leaked urine? N -  Do you have problems with loss of bowel control? N -  Managing your Medications? N -  Managing your Finances? N -  Housekeeping or managing your Housekeeping? N -  Some recent data might be hidden    Patient Care Team: Tammi Sou, MD as PCP - General (Family Medicine) Marygrace Drought, MD as Consulting Physician (Ophthalmology) Bridgett Larsson, DDS (Dental General Practice)  Indicate any recent Medical Services you may have received from other than [facility] providers in the past year (date may be approximate).     Assessment:   This is a routine wellness examination for Sala.  Hearing/Vision screen Hearing Screening - Comments:: No trouble hearing Vision Screening - Comments:: Up to date Dr. Satira Sark  Dietary issues and exercise activities  discussed: Current Exercise Habits: Home exercise routine, Type of exercise: walking, Time (Minutes): 15, Frequency (Times/Week): 3, Weekly Exercise (Minutes/Week): 45, Intensity: Mild   Goals Addressed             This Visit's Progress    Patient Stated       Maintain current lifestyle       Depression Screen PHQ 2/9 Scores 05/22/2021 03/15/2020 02/13/2019 04/14/2018 10/15/2017 06/04/2017 04/16/2017  PHQ - 2 Score 0 0 0 0 0 0 0  PHQ- 9 Score - - 0 0 1 - 1    Fall Risk Fall Risk  05/22/2021 09/13/2020 08/23/2019 06/04/2017 10/21/2015  Falls in the past year? 0 1 0 No No  Comment - - Emmi Telephone Survey: data to providers prior to load - -  Number falls in past yr: 0 0 - - -  Injury with Fall? 0 0 - - -  Follow up Falls evaluation completed;Falls prevention discussed Falls evaluation completed - - -    FALL RISK PREVENTION PERTAINING TO THE HOME:  Any stairs in or around the home? Yes  If so, are there any without handrails? No  Home free of loose throw rugs in walkways, pet beds, electrical cords, etc? Yes  Adequate lighting in your home to reduce risk of falls? Yes   ASSISTIVE DEVICES UTILIZED TO PREVENT FALLS:  Life alert? No  Use of a cane, walker or w/c? No  Grab bars in the bathroom? Yes  Shower chair or bench in shower? Yes  Elevated toilet seat or a handicapped toilet? No   TIMED UP AND GO:  Was the test performed? No .    Cognitive Function:    Hearing Screening - Comments:: No trouble hearing Vision Screening - Comments:: Up to date Dr. Satira Sark   MMSE - Mini Mental State Exam 06/04/2017  Orientation to time 5  Orientation to Place 5  Registration 3  Attention/ Calculation 5  Recall 2  Language- name 2 objects 2  Language- repeat 1  Language- follow 3 step command 3  Language- read & follow direction 1  Write a sentence 1  Copy design  1  Total score 29     6CIT Screen 05/22/2021  What Year? 0 points  What month? 0 points  What time? 0 points   Count back from 20 0 points  Months in reverse 0 points  Repeat phrase 0 points  Total Score 0    Immunizations Immunization History  Administered Date(s) Administered   Fluad Quad(high Dose 65+) 08/16/2019, 07/01/2020   Influenza Whole 06/25/2008, 07/29/2009   Influenza, High Dose Seasonal PF 08/14/2013, 08/19/2016, 07/09/2017, 08/05/2018   Influenza,inj,Quad PF,6+ Mos 06/25/2014   Influenza-Unspecified 07/09/2015   Pneumococcal Conjugate-13 01/31/2015   Pneumococcal Polysaccharide-23 08/28/2004   Td 11/14/2010   Tdap 03/18/2021    TDAP status: Up to date  Flu Vaccine status: Up to date  Pneumococcal vaccine status: Up to date  Covid-19 vaccine status: Declined, Education has been provided regarding the importance of this vaccine but patient still declined. Advised may receive this vaccine at local pharmacy or Health Dept.or vaccine clinic. Aware to provide a copy of the vaccination record if obtained from local pharmacy or Health Dept. Verbalized acceptance and understanding.  Qualifies for Shingles Vaccine? Yes   Zostavax completed No   Shingrix Completed?: No.    Education has been provided regarding the importance of this vaccine. Patient has been advised to call insurance company to determine out of pocket expense if they have not yet received this vaccine. Advised may also receive vaccine at local pharmacy or Health Dept. Verbalized acceptance and understanding.  Screening Tests Health Maintenance  Topic Date Due   COVID-19 Vaccine (1) Never done   Zoster Vaccines- Shingrix (1 of 2) Never done   INFLUENZA VACCINE  04/28/2021   TETANUS/TDAP  03/19/2031   DEXA SCAN  Completed   PNA vac Low Risk Adult  Completed   HPV VACCINES  Aged Out    Health Maintenance  Health Maintenance Due  Topic Date Due   COVID-19 Vaccine (1) Never done   Zoster Vaccines- Shingrix (1 of 2) Never done   INFLUENZA VACCINE  04/28/2021    Colorectal cancer screening: No longer  required.   Mammogram status: No longer required due to age.  Bone Density declined  Lung Cancer Screening: (Low Dose CT Chest recommended if Age 32-80 years, 30 pack-year currently smoking OR have quit w/in 15years.) does not qualify.   Lung Cancer Screening Referral:   Additional Screening:  Hepatitis C Screening: does not qualify; Completed   Vision Screening: Recommended annual ophthalmology exams for early detection of glaucoma and other disorders of the eye. Is the patient up to date with their annual eye exam?  Yes  Who is the provider or what is the name of the office in which the patient attends annual eye exams? Dr. Satira Sark If pt is not established with a provider, would they like to be referred to a provider to establish care? No .   Dental Screening: Recommended annual dental exams for proper oral hygiene  Community Resource Referral / Chronic Care Management: CRR required this visit?  No   CCM required this visit?  No      Plan:     I have personally reviewed and noted the following in the patient's chart:   Medical and social history Use of alcohol, tobacco or illicit drugs  Current medications and supplements including opioid prescriptions.  Functional ability and status Nutritional status Physical activity Advanced directives List of other physicians Hospitalizations, surgeries, and ER visits in previous 12 months Vitals Screenings to include cognitive, depression,  and falls Referrals and appointments  In addition, I have reviewed and discussed with patient certain preventive protocols, quality metrics, and best practice recommendations. A written personalized care plan for preventive services as well as general preventive health recommendations were provided to patient.     Leroy Kennedy, LPN   [phone]   Nurse Notes: na

## 2021-05-22 NOTE — Patient Instructions (Signed)
Amy Turner , Thank you for taking time to come for your Medicare Wellness Visit. I appreciate your ongoing commitment to your health goals. Please review the following plan we discussed and let me know if I can assist you in the future.   Screening recommendations/referrals: Colonoscopy: no longer required Mammogram: no longer required Bone Density: Education provided Recommended yearly ophthalmology/optometry visit for glaucoma screening and checkup Recommended yearly dental visit for hygiene and checkup  Vaccinations: Influenza vaccine: up to date Pneumococcal vaccine: up to date Tdap vaccine: up to date Shingles vaccine: Education provided    Advanced directives: Education provided  Conditions/risks identified: na     Preventive Care 37 Years and Older, Female Preventive care refers to lifestyle choices and visits with your health care provider that can promote health and wellness. What does preventive care include? A yearly physical exam. This is also called an annual well check. Dental exams once or twice a year. Routine eye exams. Ask your health care provider how often you should have your eyes checked. Personal lifestyle choices, including: Daily care of your teeth and gums. Regular physical activity. Eating a healthy diet. Avoiding tobacco and drug use. Limiting alcohol use. Practicing safe sex. Taking low-dose aspirin every day. Taking vitamin and mineral supplements as recommended by your health care provider. What happens during an annual well check? The services and screenings done by your health care provider during your annual well check will depend on your age, overall health, lifestyle risk factors, and family history of disease. Counseling  Your health care provider may ask you questions about your: Alcohol use. Tobacco use. Drug use. Emotional well-being. Home and relationship well-being. Sexual activity. Eating habits. History of falls. Memory  and ability to understand (cognition). Work and work Statistician. Reproductive health. Screening  You may have the following tests or measurements: Height, weight, and BMI. Blood pressure. Lipid and cholesterol levels. These may be checked every 5 years, or more frequently if you are over 35 years old. Skin check. Lung cancer screening. You may have this screening every year starting at age 27 if you have a 30-pack-year history of smoking and currently smoke or have quit within the past 15 years. Fecal occult blood test (FOBT) of the stool. You may have this test every year starting at age 48. Flexible sigmoidoscopy or colonoscopy. You may have a sigmoidoscopy every 5 years or a colonoscopy every 10 years starting at age 62. Hepatitis C blood test. Hepatitis B blood test. Sexually transmitted disease (STD) testing. Diabetes screening. This is done by checking your blood sugar (glucose) after you have not eaten for a while (fasting). You may have this done every 1-3 years. Bone density scan. This is done to screen for osteoporosis. You may have this done starting at age 60. Mammogram. This may be done every 1-2 years. Talk to your health care provider about how often you should have regular mammograms. Talk with your health care provider about your test results, treatment options, and if necessary, the need for more tests. Vaccines  Your health care provider may recommend certain vaccines, such as: Influenza vaccine. This is recommended every year. Tetanus, diphtheria, and acellular pertussis (Tdap, Td) vaccine. You may need a Td booster every 10 years. Zoster vaccine. You may need this after age 4. Pneumococcal 13-valent conjugate (PCV13) vaccine. One dose is recommended after age 20. Pneumococcal polysaccharide (PPSV23) vaccine. One dose is recommended after age 50. Talk to your health care provider about which screenings and vaccines you  need and how often you need them. This  information is not intended to replace advice given to you by your health care provider. Make sure you discuss any questions you have with your health care provider. Document Released: 10/11/2015 Document Revised: 06/03/2016 Document Reviewed: 07/16/2015 Elsevier Interactive Patient Education  2017 [place] Prevention in the Home Falls can cause injuries. They can happen to people of all ages. There are many things you can do to make your home safe and to help prevent falls. What can I do on the outside of my home? Regularly fix the edges of walkways and driveways and fix any cracks. Remove anything that might make you trip as you walk through a door, such as a raised step or threshold. Trim any bushes or trees on the path to your home. Use bright outdoor lighting. Clear any walking paths of anything that might make someone trip, such as rocks or tools. Regularly check to see if handrails are loose or broken. Make sure that both sides of any steps have handrails. Any raised decks and porches should have guardrails on the edges. Have any leaves, snow, or ice cleared regularly. Use sand or salt on walking paths during winter. Clean up any spills in your garage right away. This includes oil or grease spills. What can I do in the bathroom? Use night lights. Install grab bars by the toilet and in the tub and shower. Do not use towel bars as grab bars. Use non-skid mats or decals in the tub or shower. If you need to sit down in the shower, use a plastic, non-slip stool. Keep the floor dry. Clean up any water that spills on the floor as soon as it happens. Remove soap buildup in the tub or shower regularly. Attach bath mats securely with double-sided non-slip rug tape. Do not have throw rugs and other things on the floor that can make you trip. What can I do in the bedroom? Use night lights. Make sure that you have a light by your bed that is easy to reach. Do not use any sheets or  blankets that are too big for your bed. They should not hang down onto the floor. Have a firm chair that has side arms. You can use this for support while you get dressed. Do not have throw rugs and other things on the floor that can make you trip. What can I do in the kitchen? Clean up any spills right away. Avoid walking on wet floors. Keep items that you use a lot in easy-to-reach places. If you need to reach something above you, use a strong step stool that has a grab bar. Keep electrical cords out of the way. Do not use floor polish or wax that makes floors slippery. If you must use wax, use non-skid floor wax. Do not have throw rugs and other things on the floor that can make you trip. What can I do with my stairs? Do not leave any items on the stairs. Make sure that there are handrails on both sides of the stairs and use them. Fix handrails that are broken or loose. Make sure that handrails are as long as the stairways. Check any carpeting to make sure that it is firmly attached to the stairs. Fix any carpet that is loose or worn. Avoid having throw rugs at the top or bottom of the stairs. If you do have throw rugs, attach them to the floor with carpet tape. Make sure that  you have a light switch at the top of the stairs and the bottom of the stairs. If you do not have them, ask someone to add them for you. What else can I do to help prevent falls? Wear shoes that: Do not have high heels. Have rubber bottoms. Are comfortable and fit you well. Are closed at the toe. Do not wear sandals. If you use a stepladder: Make sure that it is fully opened. Do not climb a closed stepladder. Make sure that both sides of the stepladder are locked into place. Ask someone to hold it for you, if possible. Clearly mark and make sure that you can see: Any grab bars or handrails. First and last steps. Where the edge of each step is. Use tools that help you move around (mobility aids) if they are  needed. These include: Canes. Walkers. Scooters. Crutches. Turn on the lights when you go into a dark area. Replace any light bulbs as soon as they burn out. Set up your furniture so you have a clear path. Avoid moving your furniture around. If any of your floors are uneven, fix them. If there are any pets around you, be aware of where they are. Review your medicines with your doctor. Some medicines can make you feel dizzy. This can increase your chance of falling. Ask your doctor what other things that you can do to help prevent falls. This information is not intended to replace advice given to you by your health care provider. Make sure you discuss any questions you have with your health care provider. Document Released: 07/11/2009 Document Revised: 02/20/2016 Document Reviewed: 10/19/2014 Elsevier Interactive Patient Education  2017 [employer].

## 2021-05-31 DIAGNOSIS — Z23 Encounter for immunization: Secondary | ICD-10-CM | POA: Diagnosis not present

## 2021-06-04 ENCOUNTER — Other Ambulatory Visit: Payer: Self-pay | Admitting: Family Medicine

## 2021-06-18 DIAGNOSIS — H401122 Primary open-angle glaucoma, left eye, moderate stage: Secondary | ICD-10-CM | POA: Diagnosis not present

## 2021-06-18 DIAGNOSIS — H353134 Nonexudative age-related macular degeneration, bilateral, advanced atrophic with subfoveal involvement: Secondary | ICD-10-CM | POA: Diagnosis not present

## 2021-06-18 DIAGNOSIS — H52203 Unspecified astigmatism, bilateral: Secondary | ICD-10-CM | POA: Diagnosis not present

## 2021-06-18 DIAGNOSIS — H401113 Primary open-angle glaucoma, right eye, severe stage: Secondary | ICD-10-CM | POA: Diagnosis not present

## 2021-06-24 DIAGNOSIS — H401122 Primary open-angle glaucoma, left eye, moderate stage: Secondary | ICD-10-CM | POA: Diagnosis not present

## 2021-07-08 DIAGNOSIS — H401113 Primary open-angle glaucoma, right eye, severe stage: Secondary | ICD-10-CM | POA: Diagnosis not present

## 2021-08-17 ENCOUNTER — Encounter (HOSPITAL_BASED_OUTPATIENT_CLINIC_OR_DEPARTMENT_OTHER): Payer: Self-pay | Admitting: Emergency Medicine

## 2021-08-17 ENCOUNTER — Emergency Department (HOSPITAL_BASED_OUTPATIENT_CLINIC_OR_DEPARTMENT_OTHER)
Admission: EM | Admit: 2021-08-17 | Discharge: 2021-08-17 | Disposition: A | Discharge status: Home / Self Care | Payer: Medicare Other | Attending: Emergency Medicine | Admitting: Emergency Medicine

## 2021-08-17 ENCOUNTER — Other Ambulatory Visit: Payer: Self-pay

## 2021-08-17 ENCOUNTER — Emergency Department (HOSPITAL_BASED_OUTPATIENT_CLINIC_OR_DEPARTMENT_OTHER): Payer: Medicare Other

## 2021-08-17 DIAGNOSIS — R4182 Altered mental status, unspecified: Secondary | ICD-10-CM | POA: Diagnosis not present

## 2021-08-17 DIAGNOSIS — R5383 Other fatigue: Secondary | ICD-10-CM

## 2021-08-17 DIAGNOSIS — E876 Hypokalemia: Secondary | ICD-10-CM | POA: Insufficient documentation

## 2021-08-17 DIAGNOSIS — I10 Essential (primary) hypertension: Secondary | ICD-10-CM | POA: Diagnosis not present

## 2021-08-17 DIAGNOSIS — Z853 Personal history of malignant neoplasm of breast: Secondary | ICD-10-CM | POA: Diagnosis not present

## 2021-08-17 DIAGNOSIS — Z79899 Other long term (current) drug therapy: Secondary | ICD-10-CM | POA: Insufficient documentation

## 2021-08-17 LAB — URINALYSIS, ROUTINE W REFLEX MICROSCOPIC
Bilirubin Urine: NEGATIVE
Glucose, UA: NEGATIVE mg/dL
Hgb urine dipstick: NEGATIVE
Ketones, ur: NEGATIVE mg/dL
Nitrite: NEGATIVE
Protein, ur: NEGATIVE mg/dL
Specific Gravity, Urine: 1.014 (ref 1.005–1.030)
pH: 5.5 (ref 5.0–8.0)

## 2021-08-17 LAB — COMPREHENSIVE METABOLIC PANEL
ALT: 7 U/L (ref 0–44)
AST: 14 U/L — ABNORMAL LOW (ref 15–41)
Albumin: 3.9 g/dL (ref 3.5–5.0)
Alkaline Phosphatase: 98 U/L (ref 38–126)
Anion gap: 11 (ref 5–15)
BUN: 17 mg/dL (ref 8–23)
CO2: 25 mmol/L (ref 22–32)
Calcium: 9.5 mg/dL (ref 8.9–10.3)
Chloride: 105 mmol/L (ref 98–111)
Creatinine, Ser: 0.84 mg/dL (ref 0.44–1.00)
GFR, Estimated: >60 mL/min (ref >60)
Glucose, Bld: 154 mg/dL — ABNORMAL HIGH (ref 70–99)
Potassium: 3.4 mmol/L — ABNORMAL LOW (ref 3.5–5.1)
Sodium: 141 mmol/L (ref 135–145)
Total Bilirubin: 0.6 mg/dL (ref 0.3–1.2)
Total Protein: 6.8 g/dL (ref 6.5–8.1)

## 2021-08-17 LAB — CBC WITH DIFFERENTIAL/PLATELET
Abs Immature Granulocytes: 0.01 10*3/uL (ref 0.00–0.07)
Basophils Absolute: 0.1 10*3/uL (ref 0.0–0.1)
Basophils Relative: 1 %
Eosinophils Absolute: 0.4 10*3/uL (ref 0.0–0.5)
Eosinophils Relative: 4 %
HCT: 37.9 % (ref 36.0–46.0)
Hemoglobin: 12 g/dL (ref 12.0–15.0)
Immature Granulocytes: 0 %
Lymphocytes Relative: 19 %
Lymphs Abs: 1.7 10*3/uL (ref 0.7–4.0)
MCH: 31.1 pg (ref 26.0–34.0)
MCHC: 31.7 g/dL (ref 30.0–36.0)
MCV: 98.2 fL (ref 80.0–100.0)
Monocytes Absolute: 0.8 10*3/uL (ref 0.1–1.0)
Monocytes Relative: 9 %
Neutro Abs: 6 10*3/uL (ref 1.7–7.7)
Neutrophils Relative %: 67 %
Platelets: 269 10*3/uL (ref 150–400)
RBC: 3.86 MIL/uL — ABNORMAL LOW (ref 3.87–5.11)
RDW: 13.2 % (ref 11.5–15.5)
WBC: 8.9 10*3/uL (ref 4.0–10.5)
nRBC: 0 % (ref 0.0–0.2)

## 2021-08-17 LAB — LACTIC ACID, PLASMA: Lactic Acid, Venous: 2 mmol/L (ref 0.5–1.9)

## 2021-08-17 LAB — TROPONIN I (HIGH SENSITIVITY): Troponin I (High Sensitivity): 9 ng/L (ref <18)

## 2021-08-17 MED ORDER — LACTATED RINGERS IV BOLUS: 500.0000 mL | Freq: Once | INTRAVENOUS | Status: DC

## 2021-08-17 MED ORDER — METOPROLOL SUCCINATE ER 25 MG PO TB24
25.0000 mg | ORAL_TABLET | Freq: Every day | ORAL | Status: DC
  Administered 2021-08-17: 25 mg via ORAL
  Filled 2021-08-17: qty 1

## 2021-08-17 MED ORDER — AMLODIPINE BESYLATE 5 MG PO TABS
10.0000 mg | ORAL_TABLET | Freq: Once | ORAL | Status: AC
  Administered 2021-08-17: 10 mg via ORAL
  Filled 2021-08-17: qty 2

## 2021-08-17 MED ORDER — HYDROCHLOROTHIAZIDE 25 MG PO TABS
25.0000 mg | ORAL_TABLET | Freq: Once | ORAL | Status: AC
  Administered 2021-08-17: 25 mg via ORAL
  Filled 2021-08-17: qty 1

## 2021-08-17 MED ORDER — POTASSIUM CHLORIDE CRYS ER 20 MEQ PO TBCR
40.0000 meq | EXTENDED_RELEASE_TABLET | Freq: Once | ORAL | Status: AC
  Administered 2021-08-17: 40 meq via ORAL
  Filled 2021-08-17: qty 2

## 2021-08-17 NOTE — ED Triage Notes (Signed)
Pt states she doesn't feel right but cannot elaborate. Pt alert and oriented x4, able to stand and get on stretcher.

## 2021-08-17 NOTE — ED Provider Notes (Addendum)
[facility] EMERGENCY DEPT Provider Note   CSN: [id number] Arrival date & time: 08/17/21  7371     History Chief Complaint  Patient presents with   Altered Mental Status    Amy Turner is a 87 y.o. female.  HPI     87 year old female with a history of aortic insufficiency, left breast carcinoma status post lumpectomy, radiation and tamoxifen, hypertension, hyperlipidemia, who presents with concern for "feeling strange."   She reports that when she woke up today, she began feeling like "something's not right" "feeling strange"  She is unable to describe other symptoms, and on repeat ROS denies any other associated symptoms. Denies really feeling fatigued, just feels a little off. Denies lightheadedness, dizziness. Denies numbness, weakness, difficulty talking or walking, visual changes or facial droop.  Denies infectious symptoms, including no fever, cough, congestion, sore throat, urinary symptoms. Denies abdominal pain, chest pain, dyspnea.  Denies diarrhea, nausea, vomiting, black or bloody stools.  No fever. No medication changes. Has not taken medications this AM but normally does.  Just feels not quite herself and given age wanted to come and be checked out.  Reports she is not sure how she has been feeling this way exactly but it was most noticeable today.  Past Medical History:  Diagnosis Date   Aortic insufficiency 09/2016   mild/mod aortic insufficiency, and mild mitral insufficiency.--Repeat echo 1 yr.   Baker's cyst of knee 02/25/2012   LE Venous Doppler May 30, '13 - non-vascular swelling/mass left popliteal fossa c/w Baker's cyst    CARCINOMA, BREAST, LEFT 1993   Lumpectomy, adjuvant XRT, then tamoxifen.  Gets annual mammograms, no hx of abnormals--as of 2016, patient has chosen to stop getting screening mammograms.   DEPRESSION, SITUATIONAL 03/26/2009   Qualifier: Diagnosis of  By: Linda Hedges MD, Heinz Knuckles    GLAUCOMA 06/07/2007   Qualifier: Diagnosis of   By: [place], [place]     HYPERLIPIDEMIA 06/07/2007   Qualifier: Diagnosis of  By: [place], [place]     HYPERTENSION 06/07/2007   Qualifier: Diagnosis of  By: [place], [place]     HYSTERECTOMY, HX OF 06/07/2007   Qualifier: Diagnosis of  By: Danny Lawless CMA, [place]     Personal history of surgery to other organs 06/07/2007   Centricity Description: BLADDER REPAIR, HX OF Qualifier: Diagnosis of  By: [place], [place]   Centricity Description: LUMPECTOMY, BREAST, HX OF Qualifier: Diagnosis of  By: Danny Lawless CMA, [place]     POSTMENOPAUSAL OSTEOPOROSIS 11/18/2010   Qualifier: Diagnosis of  By: Linda Hedges MD, Heinz Knuckles     Patient Active Problem List   Diagnosis Date Noted   Grief reaction 04/07/2016   Medicare annual wellness visit, subsequent 10/21/2015   Polypharmacy 10/03/2014   Cervical muscle pain 02/12/2014   Cerumen impaction 01/17/2014   Routine health maintenance 03/08/2012   POSTMENOPAUSAL OSTEOPOROSIS 11/18/2010   DEPRESSION, SITUATIONAL 03/26/2009   Hyperlipidemia 06/07/2007   GLAUCOMA 06/07/2007   Essential hypertension 06/07/2007    Past Surgical History:  Procedure Laterality Date   ABDOMINAL HYSTERECTOMY     Benign reasons per pt, but she can't recall exactly what dx.  Ovaries and tubes are still in.     BLADDER REPAIR     BREAST SURGERY  1993   lumpectomy w/adjuvant XRT   EYE SURGERY  2011   cataract surgery [facility]    TRANSTHORACIC ECHOCARDIOGRAM  10/09/2016   EF 60-65%, normal wall motion, grd I DD, mild/mod aortic regurg, mild mitral regurg.  OB History   No obstetric history on file.     Family History  Problem Relation Age of Onset   Hyperlipidemia Mother    Hypertension Mother    Cervical cancer Mother    Coronary artery disease Mother    Depression Father    Colon cancer Neg Hx    Diabetes Neg Hx     Social History   Tobacco Use   Smoking status: Never   Smokeless tobacco: Never  Vaping Use   Vaping Use: Never used  Substance Use Topics    Alcohol use: No   Drug use: No    Home Medications Prior to Admission medications   Medication Sig Start Date End Date Taking? Authorizing Provider  ALPHAGAN P 0.1 % SOLN  06/09/16   [provider]  amLODipine (NORVASC) 10 MG tablet Take 1 tablet (10 mg total) by mouth daily. 09/13/20   McGowen, Adrian Blackwater, MD  BRINZOLAMIDE OP Apply 1 % to eye in the morning and at bedtime.    [provider]  hydrochlorothiazide (HYDRODIURIL) 25 MG tablet Take 1 tablet (25 mg total) by mouth daily. 09/13/20   McGowen, Adrian Blackwater, MD  hydrOXYzine (ATARAX/VISTARIL) 25 MG tablet Take 1 tablet at bedtime as needed for insomnia Patient not taking: No sig reported 11/09/16   McGowen, Adrian Blackwater, MD  latanoprost (XALATAN) 0.005 % ophthalmic solution Place 1 drop into both eyes at bedtime.    [provider]  losartan (COZAAR) 50 MG tablet Take 2 tablets (100 mg total) by mouth daily. 09/13/20   McGowen, Adrian Blackwater, MD  metoprolol succinate (TOPROL-XL) 25 MG 24 hr tablet Take 1 tablet (25 mg total) by mouth daily. 03/14/21   McGowen, Adrian Blackwater, MD  Multiple Vitamins-Minerals (ICAPS AREDS FORMULA PO) Take 1 capsule by mouth 2 (two) times daily.     [provider]  ROCKLATAN 0.02-0.005 % SOLN Apply 1 drop to eye at bedtime. 06/07/20   [provider]  sertraline (ZOLOFT) 50 MG tablet TAKE 1 TABLET BY MOUTH EVERYDAY AT BEDTIME 06/05/21   McGowen, Adrian Blackwater, MD  timolol (TIMOPTIC) 0.5 % ophthalmic solution  06/12/16   [provider]    Allergies    Atorvastatin and Prednisone  Review of Systems   Review of Systems  Physical Exam Updated Vital Signs BP (!) 181/69   Pulse 65   Temp 98 F (36.7 C) (Oral)   Resp 18   SpO2 100%   Physical Exam Vitals and nursing note reviewed.  Constitutional:      General: She is not in acute distress.    Appearance: Normal appearance. She is well-developed. She is not ill-appearing or diaphoretic.  HENT:     Head: Normocephalic and  atraumatic.  Eyes:     General: No visual field deficit.    Extraocular Movements: Extraocular movements intact.     Conjunctiva/sclera: Conjunctivae normal.     Pupils: Pupils are equal, round, and reactive to light.  Cardiovascular:     Rate and Rhythm: Normal rate and regular rhythm.     Pulses: Normal pulses.     Heart sounds: Normal heart sounds. No murmur heard.   No friction rub. No gallop.  Pulmonary:     Effort: Pulmonary effort is normal. No respiratory distress.     Breath sounds: Normal breath sounds. No wheezing or rales.  Abdominal:     General: There is no distension.     Palpations: Abdomen is soft.  Tenderness: There is no abdominal tenderness. There is no guarding.  Musculoskeletal:        General: No swelling or tenderness.     Cervical back: Normal range of motion.  Skin:    General: Skin is warm and dry.     Findings: No erythema or rash.  Neurological:     General: No focal deficit present.     Mental Status: She is alert and oriented to person, place, and time.     GCS: GCS eye subscore is 4. GCS verbal subscore is 5. GCS motor subscore is 6.     Cranial Nerves: No cranial nerve deficit, dysarthria or facial asymmetry.     Sensory: No sensory deficit.     Motor: No weakness or tremor.     Coordination: Coordination normal. Finger-Nose-Finger Test normal.     Gait: Gait normal.    ED Results / Procedures / Treatments   Labs (all labs ordered are listed, but only abnormal results are displayed) Labs Reviewed  CBC WITH DIFFERENTIAL/PLATELET - Abnormal; Notable for the following components:      Result Value   RBC 3.86 (*)    All other components within normal limits  COMPREHENSIVE METABOLIC PANEL - Abnormal; Notable for the following components:   Potassium 3.4 (*)    Glucose, Bld 154 (*)    AST 14 (*)    All other components within normal limits  LACTIC ACID, PLASMA - Abnormal; Notable for the following components:   Lactic Acid, Venous 2.0 (*)     All other components within normal limits  URINALYSIS, ROUTINE W REFLEX MICROSCOPIC - Abnormal; Notable for the following components:   Leukocytes,Ua SMALL (*)    All other components within normal limits  TROPONIN I (HIGH SENSITIVITY)    EKG EKG Interpretation  Date/Time:  Sunday August 17 2021 09:29:45 EST Ventricular Rate:  70 PR Interval:  136 QRS Duration: 86 QT Interval:  384 QTC Calculation: 415 R Axis:   35 Text Interpretation: Sinus rhythm No significant change since last tracing Confirmed by ,  ([id number]) on 08/17/2021 10:06:41 AM  Radiology DG Chest Portable 1 View  Result Date: 08/17/2021 CLINICAL DATA:  Altered mental status. EXAM: PORTABLE CHEST 1 VIEW COMPARISON:  None. FINDINGS: 0923 hours. The lungs are clear without focal pneumonia, edema, pneumothorax or pleural effusion. The cardiopericardial silhouette is within normal limits for size. The visualized bony structures of the thorax show no acute abnormality. Telemetry leads overlie the chest. IMPRESSION: No active disease. Electronically Signed   By: Eric  Mansell M.D.   On: 08/17/2021 09:38    Procedures Procedures   Medications Ordered in ED Medications  potassium chloride SA (KLOR-CON) CR tablet 40 mEq (40 mEq Oral Given 08/17/21 1207)  amLODipine (NORVASC) tablet 10 mg (10 mg Oral Given 08/17/21 1207)  hydrochlorothiazide (HYDRODIURIL) tablet 25 mg (25 mg Oral Given 08/17/21 1207)    ED Course  I have reviewed the triage vital signs and the nursing notes.  Pertinent labs & imaging results that were available during my care of the patient were reviewed by me and considered in my medical decision making (see chart for details).    MDM Rules/Calculators/A&P                            87  year old female with a history of aortic insufficiency, left breast carcinoma status post lumpectomy, radiation and tamoxifen, hypertension, hyperlipidemia, who presents with concern for "feeling  strange."  Denies any other symptoms. No significant anemia. Mild hypokalemia and given potassium. Labs without clinically significant acute abnormalities. Troponin negative. UA without infection. CXR without acute abnormality. No headache/head trauma/abnormalities on Neuro exam.  By history and exam doubt ICH, CVA, anemia, electrolyte abnormality, ACS, PE, dissection, AAA, medication changes, sepsis or severe infection.  Lactate borderline at 2, may be dehydration related. Offered IV fluids however she declines and do not see signs of acute pathology necessitating admission/surgery, and recommend continued monitoring of symptoms.     Final Clinical Impression(s) / ED Diagnoses Final diagnoses:  Other fatigue    Rx / DC Orders ED Discharge Orders     None        Gareth Morgan, MD 08/17/21 2325    Gareth Morgan, MD 10/10/21 1148

## 2021-08-17 NOTE — ED Notes (Signed)
CRITICAL VALUE STICKER  CRITICAL VALUE:Lactic acid 2.0  RECEIVER (on-site recipient of call):Shawnie Pons, RN  DATE & TIME NOTIFIED: 08-17-2021 1026  MESSENGER (representative from lab):  MD NOTIFIED: Dr. Billy Fischer  TIME OF NOTIFICATION:1026  RESPONSE:

## 2021-09-01 ENCOUNTER — Other Ambulatory Visit: Payer: Self-pay | Admitting: Family Medicine

## 2021-09-01 NOTE — Telephone Encounter (Signed)
LM for pt to return call. 30 day supply will be sent after appt made

## 2021-09-16 ENCOUNTER — Other Ambulatory Visit: Payer: Self-pay | Admitting: Family Medicine

## 2021-09-16 DIAGNOSIS — H401122 Primary open-angle glaucoma, left eye, moderate stage: Secondary | ICD-10-CM | POA: Diagnosis not present

## 2021-09-16 DIAGNOSIS — H401113 Primary open-angle glaucoma, right eye, severe stage: Secondary | ICD-10-CM | POA: Diagnosis not present

## 2021-10-10 ENCOUNTER — Other Ambulatory Visit: Payer: Self-pay | Admitting: Family Medicine

## 2021-10-24 ENCOUNTER — Other Ambulatory Visit: Payer: Self-pay | Admitting: Family Medicine

## 2021-11-01 ENCOUNTER — Other Ambulatory Visit: Payer: Self-pay | Admitting: Family Medicine

## 2021-11-04 NOTE — Telephone Encounter (Signed)
Tried calling pt to schedule appt. Unable to LVM

## 2021-11-13 ENCOUNTER — Other Ambulatory Visit: Payer: Self-pay | Admitting: Family Medicine

## 2021-11-22 ENCOUNTER — Other Ambulatory Visit: Payer: Self-pay | Admitting: Family Medicine

## 2021-11-28 ENCOUNTER — Other Ambulatory Visit: Payer: Self-pay | Admitting: Family Medicine

## 2021-12-26 ENCOUNTER — Ambulatory Visit: Payer: Medicare Other | Admitting: Family Medicine

## 2021-12-26 NOTE — Progress Notes (Deleted)
OFFICE VISIT ? ?12/26/2021 ? ?CC: No chief complaint on file. ? ?Patient is a 88 y.o. female who presents for decreased concentration. ? ?HPI: ?*** ?Patient was doing well when I last saw her in June 2022, good blood pressure control and anxiety level well controlled. ? ?Past Medical History:  ?Diagnosis Date  ? Aortic insufficiency 09/2016  ? mild/mod aortic insufficiency, and mild mitral insufficiency.--Repeat echo 1 yr.  ? Baker's cyst of knee 02/25/2012  ? LE Venous Doppler May 30, '13 - non-vascular swelling/mass left popliteal fossa c/w Baker's cyst   ? CARCINOMA, BREAST, LEFT 1993  ? Lumpectomy, adjuvant XRT, then tamoxifen.  Gets annual mammograms, no hx of abnormals--as of 2016, patient has chosen to stop getting screening mammograms.  ? DEPRESSION, SITUATIONAL 03/26/2009  ? Qualifier: Diagnosis of  By: Linda Hedges MD, Heinz Knuckles   ? GLAUCOMA 06/07/2007  ? Qualifier: Diagnosis of  By: [place], [place]    ? HYPERLIPIDEMIA 06/07/2007  ? Qualifier: Diagnosis of  By: [place], [place]    ? HYPERTENSION 06/07/2007  ? Qualifier: Diagnosis of  By: [place], [place]    ? HYSTERECTOMY, HX OF 06/07/2007  ? Qualifier: Diagnosis of  By: [place], [place]    ? Personal history of surgery to other organs 06/07/2007  ? Centricity Description: BLADDER REPAIR, HX OF Qualifier: Diagnosis of  By: Danny Lawless CMA, [place]   Centricity Description: LUMPECTOMY, BREAST, HX OF Qualifier: Diagnosis of  By: [place], [place]    ? POSTMENOPAUSAL OSTEOPOROSIS 11/18/2010  ? Qualifier: Diagnosis of  By: Linda Hedges MD, Heinz Knuckles   ? ? ?Past Surgical History:  ?Procedure Laterality Date  ? ABDOMINAL HYSTERECTOMY    ? Benign reasons per pt, but she can't recall exactly what dx.  Ovaries and tubes are still in.    ? BLADDER REPAIR    ? BREAST SURGERY  1993  ? lumpectomy w/adjuvant XRT  ? EYE SURGERY  2011  ? cataract surgery [facility]   ? TRANSTHORACIC ECHOCARDIOGRAM  10/09/2016  ? EF 60-65%, normal wall motion, grd I DD, mild/mod aortic regurg, mild mitral  regurg.  ? ? ?Outpatient Medications Prior to Visit  ?Medication Sig Dispense Refill  ? ALPHAGAN P 0.1 % SOLN     ? amLODipine (NORVASC) 10 MG tablet Take 1 tablet (10 mg total) by mouth daily. OFFICE VISIT NEEDED FOR FURTHER REFILLS 30 tablet 0  ? BRINZOLAMIDE OP Apply 1 % to eye in the morning and at bedtime.    ? hydrochlorothiazide (HYDRODIURIL) 25 MG tablet TAKE 1 TABLET (25 MG TOTAL) BY MOUTH DAILY. 90 tablet 0  ? hydrOXYzine (ATARAX/VISTARIL) 25 MG tablet Take 1 tablet at bedtime as needed for insomnia (Patient not taking: No sig reported) 30 tablet 1  ? latanoprost (XALATAN) 0.005 % ophthalmic solution Place 1 drop into both eyes at bedtime.    ? losartan (COZAAR) 50 MG tablet TAKE 2 TABLETS (100 MG TOTAL) BY MOUTH DAILY. OFFICE VISIT NEEDED FOR FURTHER REFILLS 180 tablet 1  ? metoprolol succinate (TOPROL-XL) 25 MG 24 hr tablet Take 1 tablet (25 mg total) by mouth daily. 90 tablet 3  ? Multiple Vitamins-Minerals (ICAPS AREDS FORMULA PO) Take 1 capsule by mouth 2 (two) times daily.     ? ROCKLATAN 0.02-0.005 % SOLN Apply 1 drop to eye at bedtime.    ? sertraline (ZOLOFT) 50 MG tablet Take 1 tablet (50 mg total) by mouth daily. OFFICE VISIT NEEDED FOR FURTHER REFILLS 30 tablet 0  ? timolol (TIMOPTIC) 0.5 %  ophthalmic solution     ? ?No facility-administered medications prior to visit.  ? ? ?Allergies  ?Allergen Reactions  ? Atorvastatin Other (See Comments)  ?  Leg myalgias  ? Prednisone Other (See Comments)  ?  Various cognitive/psychiatric side effects  ? ? ?ROS ?As per HPI ? ?PE: ? ?  08/17/2021  ? 12:15 PM 08/17/2021  ? 11:00 AM 08/17/2021  ? 10:00 AM  ?Vitals with BMI  ?Systolic 015 615 379  ?Diastolic 69 62 62  ?Pulse 65 65 64  ? ?Physical Exam ? ?*** ? ?LABS:  ?Last metabolic panel ?Lab Results  ?Component Value Date  ? GLUCOSE 154 (H) 08/17/2021  ? NA 141 08/17/2021  ? K 3.4 (L) 08/17/2021  ? CL 105 08/17/2021  ? CO2 25 08/17/2021  ? BUN 17 08/17/2021  ? CREATININE 0.84 08/17/2021  ? GFRNONAA >60  08/17/2021  ? CALCIUM 9.5 08/17/2021  ? PROT 6.8 08/17/2021  ? ALBUMIN 3.9 08/17/2021  ? BILITOT 0.6 08/17/2021  ? ALKPHOS 98 08/17/2021  ? AST 14 (L) 08/17/2021  ? ALT 7 08/17/2021  ? ANIONGAP 11 08/17/2021  ? ? ?IMPRESSION AND PLAN: ? ?No problem-specific Assessment & Plan notes found for this encounter. ? ? ?An After Visit Summary was printed and given to the patient. ? ?FOLLOW UP: No follow-ups on file. ? ?Signed:  Crissie Sickles, MD           12/26/2021 ? ?

## 2021-12-27 DIAGNOSIS — R7303 Prediabetes: Secondary | ICD-10-CM

## 2021-12-27 HISTORY — DX: Prediabetes: R73.03

## 2021-12-28 ENCOUNTER — Other Ambulatory Visit: Payer: Self-pay | Admitting: Family Medicine

## 2021-12-29 ENCOUNTER — Other Ambulatory Visit: Payer: Self-pay | Admitting: Family Medicine

## 2021-12-30 ENCOUNTER — Encounter: Payer: Self-pay | Admitting: Family Medicine

## 2021-12-30 ENCOUNTER — Ambulatory Visit (INDEPENDENT_AMBULATORY_CARE_PROVIDER_SITE_OTHER): Payer: Medicare Other | Admitting: Family Medicine

## 2021-12-30 VITALS — BP 154/79 | HR 85 | Temp 97.5°F | Ht 60.0 in | Wt 133.8 lb

## 2021-12-30 DIAGNOSIS — R4189 Other symptoms and signs involving cognitive functions and awareness: Secondary | ICD-10-CM

## 2021-12-30 DIAGNOSIS — I10 Essential (primary) hypertension: Secondary | ICD-10-CM | POA: Diagnosis not present

## 2021-12-30 DIAGNOSIS — R32 Unspecified urinary incontinence: Secondary | ICD-10-CM | POA: Diagnosis not present

## 2021-12-30 DIAGNOSIS — F411 Generalized anxiety disorder: Secondary | ICD-10-CM | POA: Diagnosis not present

## 2021-12-30 DIAGNOSIS — N3001 Acute cystitis with hematuria: Secondary | ICD-10-CM | POA: Diagnosis not present

## 2021-12-30 LAB — BASIC METABOLIC PANEL
BUN: 17 mg/dL (ref 6–23)
CO2: 25 mEq/L (ref 19–32)
Calcium: 10 mg/dL (ref 8.4–10.5)
Chloride: 103 mEq/L (ref 96–112)
Creatinine, Ser: 1.11 mg/dL (ref 0.40–1.20)
GFR: 42.84 mL/min — ABNORMAL LOW (ref >60.00)
Glucose, Bld: 163 mg/dL — ABNORMAL HIGH (ref 70–99)
Potassium: 4 mEq/L (ref 3.5–5.1)
Sodium: 140 mEq/L (ref 135–145)

## 2021-12-30 LAB — CBC
HCT: 38 % (ref 36.0–46.0)
Hemoglobin: 12.5 g/dL (ref 12.0–15.0)
MCHC: 32.9 g/dL (ref 30.0–36.0)
MCV: 96.9 fl (ref 78.0–100.0)
Platelets: 333 10*3/uL (ref 150.0–400.0)
RBC: 3.92 Mil/uL (ref 3.87–5.11)
RDW: 13.1 % (ref 11.5–15.5)
WBC: 9.6 10*3/uL (ref 4.0–10.5)

## 2021-12-30 LAB — POCT URINALYSIS DIPSTICK
Bilirubin, UA: NEGATIVE
Glucose, UA: NEGATIVE
Ketones, UA: NEGATIVE
Protein, UA: POSITIVE — AB
Spec Grav, UA: 1.025 (ref 1.010–1.025)
Urobilinogen, UA: 0.2 E.U./dL
pH, UA: 5 (ref 5.0–8.0)

## 2021-12-30 MED ORDER — AMLODIPINE BESYLATE 10 MG PO TABS: 10.0000 mg | ORAL_TABLET | Freq: Every day | ORAL | 1 refills | Status: DC

## 2021-12-30 MED ORDER — METOPROLOL SUCCINATE ER 25 MG PO TB24: 25.0000 mg | ORAL_TABLET | Freq: Every day | ORAL | 1 refills | Status: DC

## 2021-12-30 MED ORDER — SERTRALINE HCL 50 MG PO TABS: 50.0000 mg | ORAL_TABLET | Freq: Every day | ORAL | 1 refills | Status: DC

## 2021-12-30 MED ORDER — SULFAMETHOXAZOLE-TRIMETHOPRIM 800-160 MG PO TABS
1.0000 | ORAL_TABLET | Freq: Two times a day (BID) | ORAL | 0 refills | Status: DC
Start: 2021-12-30 — End: 2022-01-15

## 2021-12-30 NOTE — Progress Notes (Signed)
OFFICE VISIT ? ?12/30/2021 ? ?CC: Hypertension follow-up, anxiety follow-up, cognitive impairment. ? ?HPI:   ? ?Patient is a 88 y.o. female who presents accompanied by [name] and her niece for gradually declining cognitive functioning, increased anxiety, HTN. ?I last saw her for routine f/u 03/14/22 and all was stable. ? ?INTERIM HX: ?Gradual decline in memory and functioning. ?Appears she has not been taking her antihypertensives or sertraline much lately. ?More irritable and anxious lately.  Seems to be more preoccupied with planning her funeral.  She states she feels well, though. ?She lives alone but [name] checks in on her daily. ? ?She does not check any blood pressure measurements at home. ?She denies depression. ?She has some chronic urge incontinence but says this has not been any worse lately.  No dysuria ?No known fevers. ? ?ROS as above, plus--> no CP, no SOB, no wheezing, no cough, no dizziness, no HAs, no rashes, no melena/hematochezia.  No polyuria or polydipsia.  No myalgias or arthralgias.  No focal weakness, paresthesias, or tremors.  No acute vision or hearing abnormalities.  No dysuria or unusual/new urinary urgency or frequency.  No recent changes in lower legs. ?No n/v/d or abd pain.  No palpitations.   ? ?Past Medical History:  ?Diagnosis Date  ? Aortic insufficiency 09/2016  ? mild/mod aortic insufficiency, and mild mitral insufficiency.--Repeat echo 1 yr.  ? Baker's cyst of knee 02/25/2012  ? LE Venous Doppler May 30, '13 - non-vascular swelling/mass left popliteal fossa c/w Baker's cyst   ? CARCINOMA, BREAST, LEFT 1993  ? Lumpectomy, adjuvant XRT, then tamoxifen.  Gets annual mammograms, no hx of abnormals--as of 2016, patient has chosen to stop getting screening mammograms.  ? DEPRESSION, SITUATIONAL 03/26/2009  ? Qualifier: Diagnosis of  By: Linda Hedges MD, Heinz Knuckles   ? GLAUCOMA 06/07/2007  ? Qualifier: Diagnosis of  By: [place], [place]    ? HYPERLIPIDEMIA 06/07/2007  ?  Qualifier: Diagnosis of  By: [place], [place]    ? HYPERTENSION 06/07/2007  ? Qualifier: Diagnosis of  By: [place], [place]    ? HYSTERECTOMY, HX OF 06/07/2007  ? Qualifier: Diagnosis of  By: [place], [place]    ? Personal history of surgery to other organs 06/07/2007  ? Centricity Description: BLADDER REPAIR, HX OF Qualifier: Diagnosis of  By: Danny Lawless CMA, [place]   Centricity Description: LUMPECTOMY, BREAST, HX OF Qualifier: Diagnosis of  By: [place], [place]    ? POSTMENOPAUSAL OSTEOPOROSIS 11/18/2010  ? Qualifier: Diagnosis of  By: Linda Hedges MD, Heinz Knuckles   ? ? ?Past Surgical History:  ?Procedure Laterality Date  ? ABDOMINAL HYSTERECTOMY    ? Benign reasons per pt, but she can't recall exactly what dx.  Ovaries and tubes are still in.    ? BLADDER REPAIR    ? BREAST SURGERY  1993  ? lumpectomy w/adjuvant XRT  ? EYE SURGERY  2011  ? cataract surgery [facility]   ? TRANSTHORACIC ECHOCARDIOGRAM  10/09/2016  ? EF 60-65%, normal wall motion, grd I DD, mild/mod aortic regurg, mild mitral regurg.  ? ? ?Outpatient Medications Prior to Visit  ?Medication Sig Dispense Refill  ? hydrochlorothiazide (HYDRODIURIL) 25 MG tablet TAKE 1 TABLET (25 MG TOTAL) BY MOUTH DAILY. 90 tablet 0  ? latanoprost (XALATAN) 0.005 % ophthalmic solution Place 1 drop into both eyes at bedtime.    ? losartan (COZAAR) 50 MG tablet TAKE 2 TABLETS (100 MG TOTAL) BY MOUTH DAILY. OFFICE VISIT NEEDED FOR FURTHER REFILLS  180 tablet 1  ? Multiple Vitamins-Minerals (ICAPS AREDS FORMULA PO) Take 1 capsule by mouth 2 (two) times daily.     ? ROCKLATAN 0.02-0.005 % SOLN Apply 1 drop to eye at bedtime.    ? amLODipine (NORVASC) 10 MG tablet Take 1 tablet (10 mg total) by mouth daily. OFFICE VISIT NEEDED FOR FURTHER REFILLS 30 tablet 0  ? metoprolol succinate (TOPROL-XL) 25 MG 24 hr tablet Take 1 tablet (25 mg total) by mouth daily. 90 tablet 3  ? sertraline (ZOLOFT) 50 MG tablet Take 1 tablet (50 mg total) by mouth daily. OFFICE VISIT NEEDED FOR FURTHER REFILLS  30 tablet 0  ? ALPHAGAN P 0.1 % SOLN  (Patient not taking: Reported on 12/30/2021)    ? BRINZOLAMIDE OP Apply 1 % to eye in the morning and at bedtime. (Patient not taking: Reported on 12/30/2021)    ? hydrOXYzine (ATARAX/VISTARIL) 25 MG tablet Take 1 tablet at bedtime as needed for insomnia (Patient not taking: Reported on 02/13/2019) 30 tablet 1  ? timolol (TIMOPTIC) 0.5 % ophthalmic solution  (Patient not taking: Reported on 12/30/2021)    ? ?No facility-administered medications prior to visit.  ? ? ?Allergies  ?Allergen Reactions  ? Atorvastatin Other (See Comments)  ?  Leg myalgias  ? Prednisone Other (See Comments)  ?  Various cognitive/psychiatric side effects  ? ? ?ROS ?As per HPI ? ?PE: ? ?  12/30/2021  ? 11:35 AM 08/17/2021  ? 12:15 PM 08/17/2021  ? 11:00 AM  ?Vitals with BMI  ?Height '5\' 0"'$     ?Weight 133 lbs 13 oz    ?BMI 26.13    ?Systolic 315 400 867  ?Diastolic 79 69 62  ?Pulse 85 65 65  ? ? ? ?Physical Exam ? ?Gen: Alert, well appearing.  Patient is oriented to person, place, time, and situation. ?AFFECT: pleasant, lucid thought and speech. ?CV: RRR, soft systolic murmur, no diastolic murmur.  No r/g.   ?LUNGS: CTA bilat, nonlabored resps, good aeration in all lung fields. ?EXT: no clubbing or cyanosis.  No pitting edema.  ? ? ?LABS:  ?Last CBC ?Lab Results  ?Component Value Date  ? WBC 8.9 08/17/2021  ? HGB 12.0 08/17/2021  ? HCT 37.9 08/17/2021  ? MCV 98.2 08/17/2021  ? MCH 31.1 08/17/2021  ? RDW 13.2 08/17/2021  ? PLT 269 08/17/2021  ? ?Last metabolic panel ?Lab Results  ?Component Value Date  ? GLUCOSE 154 (H) 08/17/2021  ? NA 141 08/17/2021  ? K 3.4 (L) 08/17/2021  ? CL 105 08/17/2021  ? CO2 25 08/17/2021  ? BUN 17 08/17/2021  ? CREATININE 0.84 08/17/2021  ? GFRNONAA >60 08/17/2021  ? CALCIUM 9.5 08/17/2021  ? PROT 6.8 08/17/2021  ? ALBUMIN 3.9 08/17/2021  ? BILITOT 0.6 08/17/2021  ? ALKPHOS 98 08/17/2021  ? AST 14 (L) 08/17/2021  ? ALT 7 08/17/2021  ? ANIONGAP 11 08/17/2021  ? ?POC CC UA today: UA today  with blood and LEUKs ? ?IMPRESSION AND PLAN: ? ?#1 hypertension, BP elevated mildly today. ?Has been noncompliant with blood pressure medication. ?We will restart her Toprol-XL 25 a day, amlodipine 10 mg a day, losartan 100 mg a day and HCTZ 25 mg a day. ?Electrolytes and creatinine today. ? ?2.  GAD + mild cognitive impairment that is gradually progressing. ?We will get home health assessment for needs.  She definitely at least needs some assistance with medications at this time. ?We will get her restarted on sertraline 50 mg a  day. ? ?3.  UTI.  This certainly could be contributing some to her overall cognition. ?Start Bactrim double strength 1 twice daily x5 days. ?Sent urine for culture and sensitivity ? ?An After Visit Summary was printed and given to the patient. ? ?FOLLOW UP: No follow-ups on file. ? ?Signed:  Crissie Sickles, MD           12/30/2021 ? ?

## 2021-12-31 ENCOUNTER — Encounter: Payer: Self-pay | Admitting: Family Medicine

## 2021-12-31 ENCOUNTER — Other Ambulatory Visit (INDEPENDENT_AMBULATORY_CARE_PROVIDER_SITE_OTHER): Payer: Medicare Other

## 2021-12-31 DIAGNOSIS — R7301 Impaired fasting glucose: Secondary | ICD-10-CM | POA: Diagnosis not present

## 2021-12-31 LAB — HEMOGLOBIN A1C: Hgb A1c MFr Bld: 6.1 % (ref 4.6–6.5)

## 2022-01-02 LAB — URINE CULTURE
MICRO NUMBER:: 13223578
SPECIMEN QUALITY:: ADEQUATE

## 2022-01-11 ENCOUNTER — Other Ambulatory Visit: Payer: Self-pay

## 2022-01-11 ENCOUNTER — Emergency Department (HOSPITAL_BASED_OUTPATIENT_CLINIC_OR_DEPARTMENT_OTHER)
Admission: EM | Admit: 2022-01-11 | Discharge: 2022-01-11 | Discharge status: Against Medical Advice | Payer: Medicare Other | Attending: Emergency Medicine | Admitting: Emergency Medicine

## 2022-01-11 ENCOUNTER — Encounter (HOSPITAL_BASED_OUTPATIENT_CLINIC_OR_DEPARTMENT_OTHER): Payer: Self-pay | Admitting: Emergency Medicine

## 2022-01-11 DIAGNOSIS — Z5321 Procedure and treatment not carried out due to patient leaving prior to being seen by health care provider: Secondary | ICD-10-CM | POA: Insufficient documentation

## 2022-01-11 DIAGNOSIS — R5383 Other fatigue: Secondary | ICD-10-CM | POA: Diagnosis not present

## 2022-01-11 DIAGNOSIS — H9192 Unspecified hearing loss, left ear: Secondary | ICD-10-CM | POA: Diagnosis not present

## 2022-01-11 NOTE — ED Triage Notes (Signed)
Pt c/o feeling lethargic for about 3 weeks and decreased hearing to left ear. ?

## 2022-01-15 ENCOUNTER — Encounter: Payer: Self-pay | Admitting: Family Medicine

## 2022-01-15 ENCOUNTER — Ambulatory Visit (INDEPENDENT_AMBULATORY_CARE_PROVIDER_SITE_OTHER): Payer: Medicare Other | Admitting: Family Medicine

## 2022-01-15 VITALS — BP 103/57 | HR 67 | Temp 97.9°F | Ht 60.0 in | Wt 132.2 lb

## 2022-01-15 DIAGNOSIS — H6123 Impacted cerumen, bilateral: Secondary | ICD-10-CM | POA: Diagnosis not present

## 2022-01-15 DIAGNOSIS — R531 Weakness: Secondary | ICD-10-CM

## 2022-01-15 DIAGNOSIS — N3000 Acute cystitis without hematuria: Secondary | ICD-10-CM

## 2022-01-15 DIAGNOSIS — R2681 Unsteadiness on feet: Secondary | ICD-10-CM

## 2022-01-15 DIAGNOSIS — R4189 Other symptoms and signs involving cognitive functions and awareness: Secondary | ICD-10-CM

## 2022-01-15 NOTE — Patient Instructions (Signed)
Stop hctz (hydrochlorothiazide) ? ?Continue all other medications as-is. ?

## 2022-01-15 NOTE — Progress Notes (Signed)
OFFICE VISIT ? ?01/15/2022 ? ?CC:  ?Chief Complaint  ?Patient presents with  ? Hypertension  ? Urinary Tract Infection  ? ?Patient is a 88 y.o. female who presents accompanied by her sister Ernestine Conrad and niece Selinda Eon for 2 wk f/u cognitive impairment, uncontrolled HTN, and UTI. ?A/P as of last visit: ?"#1 hypertension, BP elevated mildly today. ?Has been noncompliant with blood pressure medication. ?We will restart her Toprol-XL 25 a day, amlodipine 10 mg a day, losartan 100 mg a day and HCTZ 25 mg a day. ?Electrolytes and creatinine today. ? ?2.  GAD + mild cognitive impairment that is gradually progressing. ?We will get home health assessment for needs.  She definitely at least needs some assistance with medications at this time. ?We will get her restarted on sertraline 50 mg a day. ?  ?3.  UTI.  This certainly could be contributing some to her overall cognition. ?Start Bactrim double strength 1 twice daily x5 days. ?Sent urine for culture and sensitivity" ? ?INTERIM HX: ?Seems to be doing better. ?Labs last visit remarkable for glucose 163, hemoglobin A1c 6.1%.  Her urine grew E. coli, pansensitive. ?Otherwise labs were normal. ? ?She is back on her meds. ?No diff/no side effects from recent start of sertraline. ?She lives with her son.   ?In daytime she is with one of her sisters. ?She has generalized weakness and unsteady gait.  No recent falls. ? ?ROS as above, plus--> no fevers, no CP, no SOB, no wheezing, no cough, no dizziness, no HAs, no rashes, no melena/hematochezia.  No polyuria or polydipsia.  No myalgias or arthralgias.  No focal weakness, paresthesias, or tremors.  No acute vision or hearing abnormalities.  No dysuria or unusual/new urinary urgency or frequency.  No recent changes in lower legs. ?No n/v/d or abd pain.  No palpitations.   ? ? ?Past Medical History:  ?Diagnosis Date  ? Aortic insufficiency 09/2016  ? mild/mod aortic insufficiency, and mild mitral insufficiency.--Repeat echo 1 yr.  ?  Baker's cyst of knee 02/25/2012  ? LE Venous Doppler May 30, '13 - non-vascular swelling/mass left popliteal fossa c/w Baker's cyst   ? CARCINOMA, BREAST, LEFT 1993  ? Lumpectomy, adjuvant XRT, then tamoxifen.  Gets annual mammograms, no hx of abnormals--as of 2016, patient has chosen to stop getting screening mammograms.  ? Chronic renal insufficiency, stage III (moderate) (HCC)   ? DEPRESSION, SITUATIONAL 03/26/2009  ? Qualifier: Diagnosis of  By: Linda Hedges MD, Heinz Knuckles   ? GLAUCOMA 06/07/2007  ? Qualifier: Diagnosis of  By: Rosa Sanchez, [place]    ? HYPERLIPIDEMIA 06/07/2007  ? Qualifier: Diagnosis of  By: [place], [place]    ? HYPERTENSION 06/07/2007  ? Qualifier: Diagnosis of  By: [place], [place]    ? HYSTERECTOMY, HX OF 06/07/2007  ? Qualifier: Diagnosis of  By: [place], [place]    ? Personal history of surgery to other organs 06/07/2007  ? Centricity Description: BLADDER REPAIR, HX OF Qualifier: Diagnosis of  By: Danny Lawless CMA, [place]   Centricity Description: LUMPECTOMY, BREAST, HX OF Qualifier: Diagnosis of  By: [place], [place]    ? POSTMENOPAUSAL OSTEOPOROSIS 11/18/2010  ? Qualifier: Diagnosis of  By: Linda Hedges MD, Heinz Knuckles   ? Prediabetes 12/2021  ? April 2023 a1c 6.1%  ? ? ?Past Surgical History:  ?Procedure Laterality Date  ? ABDOMINAL HYSTERECTOMY    ? Benign reasons per pt, but she can't recall exactly what dx.  Ovaries and tubes are still in.    ?  BLADDER REPAIR    ? BREAST SURGERY  1993  ? lumpectomy w/adjuvant XRT  ? EYE SURGERY  2011  ? cataract surgery [facility]   ? TRANSTHORACIC ECHOCARDIOGRAM  10/09/2016  ? EF 60-65%, normal wall motion, grd I DD, mild/mod aortic regurg, mild mitral regurg.  ? ? ?Outpatient Medications Prior to Visit  ?Medication Sig Dispense Refill  ? amLODipine (NORVASC) 10 MG tablet Take 1 tablet (10 mg total) by mouth daily. 90 tablet 1  ? latanoprost (XALATAN) 0.005 % ophthalmic solution Place 1 drop into both eyes at bedtime.    ? losartan (COZAAR) 50 MG tablet TAKE 2  TABLETS (100 MG TOTAL) BY MOUTH DAILY. OFFICE VISIT NEEDED FOR FURTHER REFILLS 180 tablet 1  ? metoprolol succinate (TOPROL-XL) 25 MG 24 hr tablet Take 1 tablet (25 mg total) by mouth daily. 90 tablet 1  ? Multiple Vitamins-Minerals (ICAPS AREDS FORMULA PO) Take 1 capsule by mouth 2 (two) times daily.     ? ROCKLATAN 0.02-0.005 % SOLN Apply 1 drop to eye at bedtime.    ? sertraline (ZOLOFT) 50 MG tablet Take 1 tablet (50 mg total) by mouth daily. 90 tablet 1  ? timolol (TIMOPTIC) 0.5 % ophthalmic solution     ? hydrochlorothiazide (HYDRODIURIL) 25 MG tablet TAKE 1 TABLET (25 MG TOTAL) BY MOUTH DAILY. 90 tablet 0  ? sulfamethoxazole-trimethoprim (BACTRIM DS) 800-160 MG tablet Take 1 tablet by mouth 2 (two) times daily. 10 tablet 0  ? ALPHAGAN P 0.1 % SOLN  (Patient not taking: Reported on 12/30/2021)    ? BRINZOLAMIDE OP Apply 1 % to eye in the morning and at bedtime. (Patient not taking: Reported on 12/30/2021)    ? hydrOXYzine (ATARAX/VISTARIL) 25 MG tablet Take 1 tablet at bedtime as needed for insomnia (Patient not taking: Reported on 02/13/2019) 30 tablet 1  ? ?No facility-administered medications prior to visit.  ? ? ?Allergies  ?Allergen Reactions  ? Atorvastatin Other (See Comments)  ?  Leg myalgias  ? Prednisone Other (See Comments)  ?  Various cognitive/psychiatric side effects  ? ? ?ROS ?As per HPI ? ?PE: ? ?  01/15/2022  ?  9:49 AM 01/11/2022  ?  1:44 PM 01/11/2022  ?  1:43 PM  ?Vitals with BMI  ?Height '5\' 0"'$   '5\' 0"'$   ?Weight 132 lbs 3 oz  133 lbs 13 oz  ?BMI 25.82  26.13  ?Systolic 030 092   ?Diastolic 57 54   ?Pulse 67 64   ?02 sat 97% RA today ? ?Physical Exam ? ?Gen: Alert, well appearing.  Patient is oriented to person, place, time, and situation. ?AFFECT: pleasant, lucid thought and speech. ?EACs: cerumen impactions bilat ?CV: RRR, no m/r/g.   ?LUNGS: CTA bilat, nonlabored resps, good aeration in all lung fields. ?EXT: no clubbing or cyanosis.  no edema.  ?MMSE: 2/3 recall, otherwise all good. ?Neuro: She is  deliberate in her general movement.  Requires some balancing and mild assistance to get up on the exam table.  CN 2-12 intact bilaterally, strength 5/5 in proximal and distal upper extremities and lower extremities bilaterally.No tremor.   No ataxia.  ? ?LABS:  ?Last CBC ?Lab Results  ?Component Value Date  ? WBC 9.6 12/30/2021  ? HGB 12.5 12/30/2021  ? HCT 38.0 12/30/2021  ? MCV 96.9 12/30/2021  ? MCH 31.1 08/17/2021  ? RDW 13.1 12/30/2021  ? PLT 333.0 12/30/2021  ? ?Last metabolic panel ?Lab Results  ?Component Value Date  ? GLUCOSE 163 (H)  12/30/2021  ? NA 140 12/30/2021  ? K 4.0 12/30/2021  ? CL 103 12/30/2021  ? CO2 25 12/30/2021  ? BUN 17 12/30/2021  ? CREATININE 1.11 12/30/2021  ? GFRNONAA >60 08/17/2021  ? CALCIUM 10.0 12/30/2021  ? PROT 6.8 08/17/2021  ? ALBUMIN 3.9 08/17/2021  ? BILITOT 0.6 08/17/2021  ? ALKPHOS 98 08/17/2021  ? AST 14 (L) 08/17/2021  ? ALT 7 08/17/2021  ? ANIONGAP 11 08/17/2021  ? ?Last lipids ?Lab Results  ?Component Value Date  ? CHOL 176 01/17/2014  ? HDL 57.60 01/17/2014  ? [place] 91 01/17/2014  ? LDLDIRECT 124.7 11/14/2010  ? TRIG 139.0 01/17/2014  ? CHOLHDL 3 01/17/2014  ? ?Last hemoglobin A1c ?Lab Results  ?Component Value Date  ? HGBA1C 6.1 12/31/2021  ? ?Last thyroid functions ?Lab Results  ?Component Value Date  ? TSH 3.17 09/29/2016  ? ?IMPRESSION AND PLAN: ? ?#1 hypertension.  Blood pressure little low now back on all medications. ?We will discontinue HCTZ at this time.   ? ?#2 GAD. ?No improvement yet on 50 mg a day sertraline. ?We will give this a few more weeks before considering increased dose. ? ?#3 UTI.  I think treating this made her feel a lot better. ? ?#4 bilateral cerumen impaction. ?Consent obtained. ?Procedure: Cerumen Disimpaction  ?Warm water was applied and gentle ear lavage performed on both sides. There were no complications.  TMs intact/normal following the disimpaction. ?uditory canals are normal.  The patient reported relief of symptoms after removal of  cerumen  ? ?#5 generalized weakness, debilitation, unsteady gait, mild cognitive impairment. ?I do think she benefit from a home health assessment for medication assistance as well as for PT. ?She is getti

## 2022-01-21 DIAGNOSIS — H401122 Primary open-angle glaucoma, left eye, moderate stage: Secondary | ICD-10-CM | POA: Diagnosis not present

## 2022-01-21 DIAGNOSIS — H401113 Primary open-angle glaucoma, right eye, severe stage: Secondary | ICD-10-CM | POA: Diagnosis not present

## 2022-01-24 DIAGNOSIS — N183 Chronic kidney disease, stage 3 unspecified: Secondary | ICD-10-CM | POA: Diagnosis not present

## 2022-01-24 DIAGNOSIS — I129 Hypertensive chronic kidney disease with stage 1 through stage 4 chronic kidney disease, or unspecified chronic kidney disease: Secondary | ICD-10-CM | POA: Diagnosis not present

## 2022-01-24 DIAGNOSIS — E785 Hyperlipidemia, unspecified: Secondary | ICD-10-CM | POA: Diagnosis not present

## 2022-01-24 DIAGNOSIS — I351 Nonrheumatic aortic (valve) insufficiency: Secondary | ICD-10-CM | POA: Diagnosis not present

## 2022-01-24 DIAGNOSIS — H6123 Impacted cerumen, bilateral: Secondary | ICD-10-CM | POA: Diagnosis not present

## 2022-01-24 DIAGNOSIS — G3184 Mild cognitive impairment, so stated: Secondary | ICD-10-CM | POA: Diagnosis not present

## 2022-01-24 DIAGNOSIS — N3000 Acute cystitis without hematuria: Secondary | ICD-10-CM | POA: Diagnosis not present

## 2022-01-24 DIAGNOSIS — F4321 Adjustment disorder with depressed mood: Secondary | ICD-10-CM | POA: Diagnosis not present

## 2022-01-24 DIAGNOSIS — F411 Generalized anxiety disorder: Secondary | ICD-10-CM | POA: Diagnosis not present

## 2022-01-24 DIAGNOSIS — Z853 Personal history of malignant neoplasm of breast: Secondary | ICD-10-CM | POA: Diagnosis not present

## 2022-01-24 DIAGNOSIS — R7303 Prediabetes: Secondary | ICD-10-CM | POA: Diagnosis not present

## 2022-01-27 ENCOUNTER — Telehealth: Payer: Self-pay

## 2022-01-27 DIAGNOSIS — I129 Hypertensive chronic kidney disease with stage 1 through stage 4 chronic kidney disease, or unspecified chronic kidney disease: Secondary | ICD-10-CM | POA: Diagnosis not present

## 2022-01-27 DIAGNOSIS — N3000 Acute cystitis without hematuria: Secondary | ICD-10-CM | POA: Diagnosis not present

## 2022-01-27 DIAGNOSIS — H6123 Impacted cerumen, bilateral: Secondary | ICD-10-CM | POA: Diagnosis not present

## 2022-01-27 DIAGNOSIS — F411 Generalized anxiety disorder: Secondary | ICD-10-CM | POA: Diagnosis not present

## 2022-01-27 DIAGNOSIS — I351 Nonrheumatic aortic (valve) insufficiency: Secondary | ICD-10-CM | POA: Diagnosis not present

## 2022-01-27 DIAGNOSIS — N183 Chronic kidney disease, stage 3 unspecified: Secondary | ICD-10-CM | POA: Diagnosis not present

## 2022-01-27 NOTE — Telephone Encounter (Signed)
Signed and put in box to go up front. ?Signed:  Crissie Sickles, MD           01/27/2022 ? ?

## 2022-01-27 NOTE — Telephone Encounter (Signed)
Home health orders(SN) received 01/23/22 for [facility] ?Home health initiation orders: Yes.  ?Home health re-certification orders: No. ?Patient last seen by ordering physician for this condition: 01/15/22. Must be less than 90 days for re-certification and less than 30 days prior for initiation. Visit must have been for the condition the orders are being placed.  ?Patient meets criteria for Physician to sign orders: Yes.  ?      Current med list has been attached: No  ?      Orders placed on physicians desk for signature: 01/27/22 (date) ?If patient does not meet criteria for orders to be signed: pt was called to schedule appt. Appt is scheduled for 02/13/22.  ? ?Ebrahim Deremer D [facility] ? ?

## 2022-01-28 NOTE — Telephone Encounter (Signed)
Signed form faxed back.

## 2022-01-29 DIAGNOSIS — F411 Generalized anxiety disorder: Secondary | ICD-10-CM | POA: Diagnosis not present

## 2022-01-29 DIAGNOSIS — I351 Nonrheumatic aortic (valve) insufficiency: Secondary | ICD-10-CM | POA: Diagnosis not present

## 2022-01-29 DIAGNOSIS — N183 Chronic kidney disease, stage 3 unspecified: Secondary | ICD-10-CM | POA: Diagnosis not present

## 2022-01-29 DIAGNOSIS — I129 Hypertensive chronic kidney disease with stage 1 through stage 4 chronic kidney disease, or unspecified chronic kidney disease: Secondary | ICD-10-CM | POA: Diagnosis not present

## 2022-01-29 DIAGNOSIS — N3000 Acute cystitis without hematuria: Secondary | ICD-10-CM | POA: Diagnosis not present

## 2022-01-29 DIAGNOSIS — H6123 Impacted cerumen, bilateral: Secondary | ICD-10-CM | POA: Diagnosis not present

## 2022-02-02 DIAGNOSIS — N3000 Acute cystitis without hematuria: Secondary | ICD-10-CM | POA: Diagnosis not present

## 2022-02-02 DIAGNOSIS — I351 Nonrheumatic aortic (valve) insufficiency: Secondary | ICD-10-CM | POA: Diagnosis not present

## 2022-02-02 DIAGNOSIS — N183 Chronic kidney disease, stage 3 unspecified: Secondary | ICD-10-CM | POA: Diagnosis not present

## 2022-02-02 DIAGNOSIS — I129 Hypertensive chronic kidney disease with stage 1 through stage 4 chronic kidney disease, or unspecified chronic kidney disease: Secondary | ICD-10-CM | POA: Diagnosis not present

## 2022-02-02 DIAGNOSIS — F411 Generalized anxiety disorder: Secondary | ICD-10-CM | POA: Diagnosis not present

## 2022-02-02 DIAGNOSIS — H6123 Impacted cerumen, bilateral: Secondary | ICD-10-CM | POA: Diagnosis not present

## 2022-02-09 DIAGNOSIS — I129 Hypertensive chronic kidney disease with stage 1 through stage 4 chronic kidney disease, or unspecified chronic kidney disease: Secondary | ICD-10-CM | POA: Diagnosis not present

## 2022-02-09 DIAGNOSIS — N183 Chronic kidney disease, stage 3 unspecified: Secondary | ICD-10-CM | POA: Diagnosis not present

## 2022-02-09 DIAGNOSIS — H6123 Impacted cerumen, bilateral: Secondary | ICD-10-CM | POA: Diagnosis not present

## 2022-02-09 DIAGNOSIS — N3000 Acute cystitis without hematuria: Secondary | ICD-10-CM | POA: Diagnosis not present

## 2022-02-09 DIAGNOSIS — F411 Generalized anxiety disorder: Secondary | ICD-10-CM | POA: Diagnosis not present

## 2022-02-09 DIAGNOSIS — I351 Nonrheumatic aortic (valve) insufficiency: Secondary | ICD-10-CM | POA: Diagnosis not present

## 2022-02-13 ENCOUNTER — Ambulatory Visit (INDEPENDENT_AMBULATORY_CARE_PROVIDER_SITE_OTHER): Payer: Medicare Other | Admitting: Family Medicine

## 2022-02-13 ENCOUNTER — Encounter: Payer: Self-pay | Admitting: Family Medicine

## 2022-02-13 VITALS — BP 133/79 | HR 75 | Temp 97.8°F | Ht 60.0 in | Wt 132.6 lb

## 2022-02-13 DIAGNOSIS — I10 Essential (primary) hypertension: Secondary | ICD-10-CM

## 2022-02-13 DIAGNOSIS — R531 Weakness: Secondary | ICD-10-CM

## 2022-02-13 DIAGNOSIS — R5381 Other malaise: Secondary | ICD-10-CM

## 2022-02-13 DIAGNOSIS — F411 Generalized anxiety disorder: Secondary | ICD-10-CM | POA: Diagnosis not present

## 2022-02-13 NOTE — Progress Notes (Signed)
OFFICE VISIT  02/13/2022  CC: f/u weakness and bp  Patient is a 88 y.o. female who presents for 1 mo f/u generalized weakness/debilitation, GAD, and HTN. A/P as of last visit: "#1 hypertension.  Blood pressure little low now back on all medications. We will discontinue HCTZ at this time.     #2 GAD. No improvement yet on 50 mg a day sertraline. We will give this a few more weeks before considering increased dose.   #3 UTI.  I think treating this made her feel a lot better.   #4 bilateral cerumen impaction. Consent obtained. Procedure: Cerumen Disimpaction  Warm water was applied and gentle ear lavage performed on both sides. There were no complications.  TMs intact/normal following the disimpaction. uditory canals are normal.  The patient reported relief of symptoms after removal of cerumen    #5 generalized weakness, debilitation, unsteady gait, mild cognitive impairment. I do think she benefit from a home health assessment for medication assistance as well as for PT. She is getting excellent 24/7 care by her son and sisters and niece."  INTERIM HX: Amy Turner is here with her sister and niece today. She is doing well, makes herself her meals and does a devotional every day and picks weeds in her garden quite a bit.  She no longer has to be supervised 24/7. She does not require a walker for ambulation but does have one on hand if needed.  Her sister checks her blood pressure periodically and it is consistently 130/80 or better.  ROS as above, plus--> no fevers, no CP, no SOB, no wheezing, no cough, no dizziness, no HAs, no rashes, no melena/hematochezia.  No polyuria or polydipsia.  No myalgias or arthralgias.  No focal weakness, paresthesias, or tremors.  No acute vision or hearing abnormalities.  No dysuria or unusual/new urinary urgency or frequency.  No recent changes in lower legs. No n/v/d or abd pain.  No palpitations.    Past Medical History:  Diagnosis Date   Aortic  insufficiency 09/2016   mild/mod aortic insufficiency, and mild mitral insufficiency.--Repeat echo 1 yr.   Baker's cyst of knee 02/25/2012   LE Venous Doppler May 30, '13 - non-vascular swelling/mass left popliteal fossa c/w Baker's cyst    CARCINOMA, BREAST, LEFT 1993   Lumpectomy, adjuvant XRT, then tamoxifen.  Gets annual mammograms, no hx of abnormals--as of 2016, patient has chosen to stop getting screening mammograms.   Chronic renal insufficiency, stage III (moderate) (HCC)    DEPRESSION, SITUATIONAL 03/26/2009   Qualifier: Diagnosis of  By: Linda Hedges MD, Heinz Knuckles    GLAUCOMA 06/07/2007   Qualifier: Diagnosis of  By: [place], [place]     HYPERLIPIDEMIA 06/07/2007   Qualifier: Diagnosis of  By: Danny Lawless CMA, [place]     HYPERTENSION 06/07/2007   Qualifier: Diagnosis of  By: Danny Lawless [place], [place]     HYSTERECTOMY, HX OF 06/07/2007   Qualifier: Diagnosis of  By: Danny Lawless CMA, [place]     Personal history of surgery to other organs 06/07/2007   Centricity Description: BLADDER REPAIR, HX OF Qualifier: Diagnosis of  By: Danny Lawless CMA, [place]   Centricity Description: LUMPECTOMY, BREAST, HX OF Qualifier: Diagnosis of  By: Danny Lawless CMA, [place]     POSTMENOPAUSAL OSTEOPOROSIS 11/18/2010   Qualifier: Diagnosis of  By: Linda Hedges MD, Heinz Knuckles    Prediabetes 12/2021   April 2023 a1c 6.1%    Past Surgical History:  Procedure Laterality Date   ABDOMINAL HYSTERECTOMY     Benign reasons per  pt, but she can't recall exactly what dx.  Ovaries and tubes are still in.     BLADDER REPAIR     BREAST SURGERY  1993   lumpectomy w/adjuvant XRT   EYE SURGERY  2011   cataract surgery [facility]    TRANSTHORACIC ECHOCARDIOGRAM  10/09/2016   EF 60-65%, normal wall motion, grd I DD, mild/mod aortic regurg, mild mitral regurg.    Outpatient Medications Prior to Visit  Medication Sig Dispense Refill   amLODipine (NORVASC) 10 MG tablet Take 1 tablet (10 mg total) by mouth daily. 90 tablet 1   brinzolamide (AZOPT) 1  % ophthalmic suspension 1 drop 2 (two) times daily.     latanoprost (XALATAN) 0.005 % ophthalmic solution Place 1 drop into both eyes at bedtime.     losartan (COZAAR) 50 MG tablet TAKE 2 TABLETS (100 MG TOTAL) BY MOUTH DAILY. OFFICE VISIT NEEDED FOR FURTHER REFILLS 180 tablet 1   metoprolol succinate (TOPROL-XL) 25 MG 24 hr tablet Take 1 tablet (25 mg total) by mouth daily. 90 tablet 1   Multiple Vitamins-Minerals (ICAPS AREDS FORMULA PO) Take 1 capsule by mouth 2 (two) times daily.      ROCKLATAN 0.02-0.005 % SOLN Apply 1 drop to eye at bedtime.     sertraline (ZOLOFT) 50 MG tablet Take 1 tablet (50 mg total) by mouth daily. 90 tablet 1   timolol (TIMOPTIC) 0.5 % ophthalmic solution      No facility-administered medications prior to visit.    Allergies  Allergen Reactions   Atorvastatin Other (See Comments)    Leg myalgias   Prednisone Other (See Comments)    Various cognitive/psychiatric side effects    ROS As per HPI  PE:    02/13/2022   10:29 AM 01/15/2022    9:49 AM 01/11/2022    1:44 PM  Vitals with BMI  Height '5\' 0"'$  '5\' 0"'$    Weight 132 lbs 10 oz 132 lbs 3 oz   BMI 76.7 20.94   Systolic 709 628 366  Diastolic 79 57 54  Pulse 75 67 64   Physical Exam  Gen: Alert, well appearing.  Patient is oriented to person, place, time, and situation. AFFECT: pleasant, lucid thought and speech. CV: RRR, no m/r/g.   LUNGS: CTA bilat, nonlabored resps, good aeration in all lung fields. EXT: no clubbing or cyanosis.  Trace bilat LL doughy-type edema but no pitting edema.    LABS:  Last CBC Lab Results  Component Value Date   WBC 9.6 12/30/2021   HGB 12.5 12/30/2021   HCT 38.0 12/30/2021   MCV 96.9 12/30/2021   MCH 31.1 08/17/2021   RDW 13.1 12/30/2021   PLT 333.0 29/47/6546   Last metabolic panel Lab Results  Component Value Date   GLUCOSE 163 (H) 12/30/2021   NA 140 12/30/2021   K 4.0 12/30/2021   CL 103 12/30/2021   CO2 25 12/30/2021   BUN 17 12/30/2021    CREATININE 1.11 12/30/2021   GFRNONAA >60 08/17/2021   CALCIUM 10.0 12/30/2021   PROT 6.8 08/17/2021   ALBUMIN 3.9 08/17/2021   BILITOT 0.6 08/17/2021   ALKPHOS 98 08/17/2021   AST 14 (L) 08/17/2021   ALT 7 08/17/2021   ANIONGAP 11 08/17/2021   Last lipids Lab Results  Component Value Date   CHOL 176 01/17/2014   HDL 57.60 01/17/2014   LDLCALC 91 01/17/2014   LDLDIRECT 124.7 11/14/2010   TRIG 139.0 01/17/2014   CHOLHDL 3 01/17/2014   Last hemoglobin  A1c Lab Results  Component Value Date   HGBA1C 6.1 12/31/2021   Last thyroid functions Lab Results  Component Value Date   TSH 3.17 09/29/2016   IMPRESSION AND PLAN:  #1 hypertension, well controlled on losartan 50 mg a day and amlodipine 10 mg a day. Kidney function and electrolytes were normal about 6 weeks ago. No labs needed today.  #2 GAD, she is gradually showing improvement since getting back on sertraline '50mg'$  qd--->continue this.  #3 Generalized weakness, debilitation, unsteady gait, mild cognitive impairment. This is all significantly improved now. She is getting some home health care. She does get checked on daily by family members but is no longer requiring 24/7 supervision.  She is progressing very well.  An After Visit Summary was printed and given to the patient.  FOLLOW UP: Return in about 2 months (around 04/15/2022) for routine chronic illness f/u.  Signed:  Crissie Sickles, MD           02/13/2022

## 2022-02-16 DIAGNOSIS — N183 Chronic kidney disease, stage 3 unspecified: Secondary | ICD-10-CM | POA: Diagnosis not present

## 2022-02-16 DIAGNOSIS — I129 Hypertensive chronic kidney disease with stage 1 through stage 4 chronic kidney disease, or unspecified chronic kidney disease: Secondary | ICD-10-CM | POA: Diagnosis not present

## 2022-02-16 DIAGNOSIS — I351 Nonrheumatic aortic (valve) insufficiency: Secondary | ICD-10-CM | POA: Diagnosis not present

## 2022-02-16 DIAGNOSIS — H6123 Impacted cerumen, bilateral: Secondary | ICD-10-CM | POA: Diagnosis not present

## 2022-02-16 DIAGNOSIS — N3000 Acute cystitis without hematuria: Secondary | ICD-10-CM | POA: Diagnosis not present

## 2022-02-16 DIAGNOSIS — F411 Generalized anxiety disorder: Secondary | ICD-10-CM | POA: Diagnosis not present

## 2022-02-18 ENCOUNTER — Telehealth: Payer: Self-pay

## 2022-02-18 NOTE — Telephone Encounter (Signed)
Home health orders received 02/17/22 for [facility] initiation orders: Yes.  Home health re-certification orders: No. Patient last seen by ordering physician for this condition: 02/13/22. Must be less than 90 days for re-certification and less than 30 days prior for initiation. Visit must have been for the condition the orders are being placed.  Patient meets criteria for Physician to sign orders: Yes.        Current med list has been attached: No        Orders placed on physicians desk for signature: 02/18/22 (date) If patient does not meet criteria for orders to be signed: pt was called to schedule appt. Appt is scheduled for 04/15/22.   Emilee Hero

## 2022-02-18 NOTE — Telephone Encounter (Signed)
Signed and put in box to go up front. Signed:  Crissie Sickles, MD           02/18/2022

## 2022-02-23 DIAGNOSIS — N3000 Acute cystitis without hematuria: Secondary | ICD-10-CM | POA: Diagnosis not present

## 2022-02-23 DIAGNOSIS — I129 Hypertensive chronic kidney disease with stage 1 through stage 4 chronic kidney disease, or unspecified chronic kidney disease: Secondary | ICD-10-CM | POA: Diagnosis not present

## 2022-02-23 DIAGNOSIS — N183 Chronic kidney disease, stage 3 unspecified: Secondary | ICD-10-CM | POA: Diagnosis not present

## 2022-02-23 DIAGNOSIS — E785 Hyperlipidemia, unspecified: Secondary | ICD-10-CM | POA: Diagnosis not present

## 2022-02-23 DIAGNOSIS — I351 Nonrheumatic aortic (valve) insufficiency: Secondary | ICD-10-CM | POA: Diagnosis not present

## 2022-02-23 DIAGNOSIS — F411 Generalized anxiety disorder: Secondary | ICD-10-CM | POA: Diagnosis not present

## 2022-02-23 DIAGNOSIS — H6123 Impacted cerumen, bilateral: Secondary | ICD-10-CM | POA: Diagnosis not present

## 2022-02-23 DIAGNOSIS — Z853 Personal history of malignant neoplasm of breast: Secondary | ICD-10-CM | POA: Diagnosis not present

## 2022-02-23 DIAGNOSIS — F4321 Adjustment disorder with depressed mood: Secondary | ICD-10-CM | POA: Diagnosis not present

## 2022-02-23 DIAGNOSIS — R7303 Prediabetes: Secondary | ICD-10-CM | POA: Diagnosis not present

## 2022-02-23 DIAGNOSIS — G3184 Mild cognitive impairment, so stated: Secondary | ICD-10-CM | POA: Diagnosis not present

## 2022-03-02 DIAGNOSIS — F411 Generalized anxiety disorder: Secondary | ICD-10-CM | POA: Diagnosis not present

## 2022-03-02 DIAGNOSIS — H6123 Impacted cerumen, bilateral: Secondary | ICD-10-CM | POA: Diagnosis not present

## 2022-03-02 DIAGNOSIS — N3000 Acute cystitis without hematuria: Secondary | ICD-10-CM | POA: Diagnosis not present

## 2022-03-02 DIAGNOSIS — I351 Nonrheumatic aortic (valve) insufficiency: Secondary | ICD-10-CM | POA: Diagnosis not present

## 2022-03-02 DIAGNOSIS — I129 Hypertensive chronic kidney disease with stage 1 through stage 4 chronic kidney disease, or unspecified chronic kidney disease: Secondary | ICD-10-CM | POA: Diagnosis not present

## 2022-03-02 DIAGNOSIS — N183 Chronic kidney disease, stage 3 unspecified: Secondary | ICD-10-CM | POA: Diagnosis not present

## 2022-03-04 DIAGNOSIS — H401122 Primary open-angle glaucoma, left eye, moderate stage: Secondary | ICD-10-CM | POA: Diagnosis not present

## 2022-03-04 DIAGNOSIS — H401113 Primary open-angle glaucoma, right eye, severe stage: Secondary | ICD-10-CM | POA: Diagnosis not present

## 2022-03-09 DIAGNOSIS — I351 Nonrheumatic aortic (valve) insufficiency: Secondary | ICD-10-CM | POA: Diagnosis not present

## 2022-03-09 DIAGNOSIS — N183 Chronic kidney disease, stage 3 unspecified: Secondary | ICD-10-CM | POA: Diagnosis not present

## 2022-03-09 DIAGNOSIS — F411 Generalized anxiety disorder: Secondary | ICD-10-CM | POA: Diagnosis not present

## 2022-03-09 DIAGNOSIS — N3000 Acute cystitis without hematuria: Secondary | ICD-10-CM | POA: Diagnosis not present

## 2022-03-09 DIAGNOSIS — I129 Hypertensive chronic kidney disease with stage 1 through stage 4 chronic kidney disease, or unspecified chronic kidney disease: Secondary | ICD-10-CM | POA: Diagnosis not present

## 2022-03-09 DIAGNOSIS — H6123 Impacted cerumen, bilateral: Secondary | ICD-10-CM | POA: Diagnosis not present

## 2022-03-16 DIAGNOSIS — F411 Generalized anxiety disorder: Secondary | ICD-10-CM | POA: Diagnosis not present

## 2022-03-16 DIAGNOSIS — I129 Hypertensive chronic kidney disease with stage 1 through stage 4 chronic kidney disease, or unspecified chronic kidney disease: Secondary | ICD-10-CM | POA: Diagnosis not present

## 2022-03-16 DIAGNOSIS — I351 Nonrheumatic aortic (valve) insufficiency: Secondary | ICD-10-CM | POA: Diagnosis not present

## 2022-03-16 DIAGNOSIS — N3000 Acute cystitis without hematuria: Secondary | ICD-10-CM | POA: Diagnosis not present

## 2022-03-16 DIAGNOSIS — H6123 Impacted cerumen, bilateral: Secondary | ICD-10-CM | POA: Diagnosis not present

## 2022-03-16 DIAGNOSIS — N183 Chronic kidney disease, stage 3 unspecified: Secondary | ICD-10-CM | POA: Diagnosis not present

## 2022-04-15 ENCOUNTER — Encounter: Payer: Self-pay | Admitting: Family Medicine

## 2022-04-15 ENCOUNTER — Ambulatory Visit (INDEPENDENT_AMBULATORY_CARE_PROVIDER_SITE_OTHER): Payer: Medicare Other | Admitting: Family Medicine

## 2022-04-15 VITALS — BP 134/68 | HR 66 | Temp 98.2°F | Wt 136.2 lb

## 2022-04-15 DIAGNOSIS — I10 Essential (primary) hypertension: Secondary | ICD-10-CM | POA: Diagnosis not present

## 2022-04-15 DIAGNOSIS — F411 Generalized anxiety disorder: Secondary | ICD-10-CM

## 2022-04-15 MED ORDER — LOSARTAN POTASSIUM 50 MG PO TABS: 100.0000 mg | ORAL_TABLET | Freq: Every day | ORAL | 1 refills | Status: DC

## 2022-04-15 NOTE — Progress Notes (Signed)
OFFICE VISIT  04/15/2022  CC:  Chief Complaint  Patient presents with   Hypertension    Pt is not fasting    Patient is a 88 y.o. female who presents for 81mof/u GAD, HTN, and generalized weakness/debilitation. A/P as of last visit: "#1 hypertension, well controlled on losartan 50 mg a day and amlodipine 10 mg a day. Kidney function and electrolytes were normal about 6 weeks ago. No labs needed today.   #2 GAD, she is gradually showing improvement since getting back on sertraline '50mg'$  qd--->continue this.   #3 Generalized weakness, debilitation, unsteady gait, mild cognitive impairment. This is all significantly improved now. She is getting some home health care. She does get checked on daily by family members but is no longer requiring 24/7 supervision. She is progressing very well."  INTERIM HX: She is doing great.  Energy level is up and appetite is good. No urinary complaints. No headache, chest pain, shortness of breath, or lower extremity swelling.  She is compliant with her sertraline, amlodipine, metoprolol, and losartan.  Anxiety level is stable.  Past Medical History:  Diagnosis Date   Aortic insufficiency 09/2016   mild/mod aortic insufficiency, and mild mitral insufficiency.--Repeat echo 1 yr.   Baker's cyst of knee 02/25/2012   LE Venous Doppler May 30, '13 - non-vascular swelling/mass left popliteal fossa c/w Baker's cyst    CARCINOMA, BREAST, LEFT 1993   Lumpectomy, adjuvant XRT, then tamoxifen.  Gets annual mammograms, no hx of abnormals--as of 2016, patient has chosen to stop getting screening mammograms.   Chronic renal insufficiency, stage III (moderate) (HCC)    DEPRESSION, SITUATIONAL 03/26/2009   Qualifier: Diagnosis of  By: NLinda HedgesMD, MHeinz Knuckles   GLAUCOMA 06/07/2007   Qualifier: Diagnosis of  By: [place]    HYPERLIPIDEMIA 06/07/2007   Qualifier: Diagnosis of  By: SDanny LawlessCMA, [place]    HYPERTENSION 06/07/2007   Qualifier: Diagnosis  of  By: [place]anny Lawless[place]    HYSTERECTOMY, HX OF 06/07/2007   Qualifier: Diagnosis of  By: SDanny LawlessCMA, [place]    Personal history of surgery to other organs 06/07/2007   Centricity Description: BLADDER REPAIR, HX OF Qualifier: Diagnosis of  By: SDanny LawlessCMA, [place]  Centricity Description: LUMPECTOMY, BREAST, HX OF Qualifier: Diagnosis of  By: SDanny LawlessCMA, [place]    POSTMENOPAUSAL OSTEOPOROSIS 11/18/2010   Qualifier: Diagnosis of  By: NLinda HedgesMD, MHeinz Knuckles   Prediabetes 12/2021   April 2023 a1c 6.1%    Past Surgical History:  Procedure Laterality Date   ABDOMINAL HYSTERECTOMY     Benign reasons per pt, but she can't recall exactly what dx.  Ovaries and tubes are still in.     BLADDER REPAIR     BREAST SURGERY  1993   lumpectomy w/adjuvant XRT   EYE SURGERY  2011   cataract surgery [facility]    TRANSTHORACIC ECHOCARDIOGRAM  10/09/2016   EF 60-65%, normal wall motion, grd I DD, mild/mod aortic regurg, mild mitral regurg.    Outpatient Medications Prior to Visit  Medication Sig Dispense Refill   amLODipine (NORVASC) 10 MG tablet Take 1 tablet (10 mg total) by mouth daily. 90 tablet 1   brinzolamide (AZOPT) 1 % ophthalmic suspension 1 drop 2 (two) times daily.     latanoprost (XALATAN) 0.005 % ophthalmic solution Place 1 drop into both eyes at bedtime.     metoprolol succinate (TOPROL-XL) 25 MG 24 hr tablet Take 1 tablet (25 mg  total) by mouth daily. 90 tablet 1   Multiple Vitamins-Minerals (ICAPS AREDS FORMULA PO) Take 1 capsule by mouth 2 (two) times daily.      ROCKLATAN 0.02-0.005 % SOLN Apply 1 drop to eye at bedtime.     sertraline (ZOLOFT) 50 MG tablet Take 1 tablet (50 mg total) by mouth daily. 90 tablet 1   timolol (TIMOPTIC) 0.5 % ophthalmic solution      losartan (COZAAR) 50 MG tablet TAKE 2 TABLETS (100 MG TOTAL) BY MOUTH DAILY. OFFICE VISIT NEEDED FOR FURTHER REFILLS 180 tablet 1   No facility-administered medications prior to visit.    Allergies  Allergen  Reactions   Atorvastatin Other (See Comments)    Leg myalgias   Prednisone Other (See Comments)    Various cognitive/psychiatric side effects    ROS As per HPI  PE:    04/15/2022   10:05 AM 04/15/2022    9:58 AM 02/13/2022   10:29 AM  Vitals with BMI  Height   '5\' 0"'$   Weight  136 lbs 3 oz 132 lbs 10 oz  BMI   53.9  Systolic 767 341 937  Diastolic 68 67 79  Pulse  66 75     Physical Exam  Gen: Alert, well appearing.  Patient is oriented to person, place, time, and situation.a AFFECT: pleasant, lucid thought and speech. CV: RRR, no m/r/g.   LUNGS: CTA bilat, nonlabored resps, good aeration in all lung fields. EXT: no clubbing or cyanosis.  no edema.   LABS:  Last CBC Lab Results  Component Value Date   WBC 9.6 12/30/2021   HGB 12.5 12/30/2021   HCT 38.0 12/30/2021   MCV 96.9 12/30/2021   MCH 31.1 08/17/2021   RDW 13.1 12/30/2021   PLT 333.0 90/24/0973   Last metabolic panel Lab Results  Component Value Date   GLUCOSE 163 (H) 12/30/2021   NA 140 12/30/2021   K 4.0 12/30/2021   CL 103 12/30/2021   CO2 25 12/30/2021   BUN 17 12/30/2021   CREATININE 1.11 12/30/2021   GFRNONAA >60 08/17/2021   CALCIUM 10.0 12/30/2021   PROT 6.8 08/17/2021   ALBUMIN 3.9 08/17/2021   BILITOT 0.6 08/17/2021   ALKPHOS 98 08/17/2021   AST 14 (L) 08/17/2021   ALT 7 08/17/2021   ANIONGAP 11 08/17/2021   Last lipids Lab Results  Component Value Date   CHOL 176 01/17/2014   HDL 57.60 01/17/2014   LDLCALC 91 01/17/2014   LDLDIRECT 124.7 11/14/2010   TRIG 139.0 01/17/2014   CHOLHDL 3 01/17/2014   Last hemoglobin A1c Lab Results  Component Value Date   HGBA1C 6.1 12/31/2021   Last thyroid functions Lab Results  Component Value Date   TSH 3.17 09/29/2016   IMPRESSION AND PLAN:  #1 hypertension, well controlled on amlodipine 10 mg a day, losartan 100 mg a day, and Toprol-XL 25 mg a day. No labs needed today.  #2 GAD, doing well on sertraline 50 mg a day.  #3   Generalized weakness/debilitation. She has come along way in the last few months. Ambulating without a walker, independent of all ADLs.  An After Visit Summary was printed and given to the patient.  FOLLOW UP: Return in about 4 months (around 08/16/2022) for routine chronic illness f/u.  Signed:  Crissie Sickles, MD           04/15/2022

## 2022-05-27 ENCOUNTER — Ambulatory Visit: Payer: Medicare Other

## 2022-05-27 ENCOUNTER — Ambulatory Visit (INDEPENDENT_AMBULATORY_CARE_PROVIDER_SITE_OTHER): Payer: Medicare Other

## 2022-05-27 DIAGNOSIS — Z0000 Encounter for general adult medical examination without abnormal findings: Secondary | ICD-10-CM

## 2022-05-27 NOTE — Progress Notes (Signed)
Subjective:   Amy Turner is a 88 y.o. female who presents for Medicare Annual (Subsequent) preventive examination.   I connected with  [name] on 05/27/22 by an audio only telemedicine application and verified that I am speaking with the correct person using two identifiers.   I discussed the limitations, risks, security and privacy concerns of performing an evaluation and management service by telephone and the availability of in person appointments. I also discussed with the patient that there may be a patient responsible charge related to this service. The patient expressed understanding and verbally consented to this telephonic visit.  Location of Patient: home Location of Provider:office  List any persons and their role that are participating in the visit with the patient.     Review of Systems    Defer to PCP Cardiac Risk Factors include: advanced age (>39mn, >>34women);hypertension     Objective:    There were no vitals filed for this visit. There is no height or weight on file to calculate BMI.     05/27/2022    9:59 AM 01/11/2022    1:44 PM 08/17/2021    8:40 AM 06/17/2020    1:41 PM 06/04/2017   10:03 AM 11/05/2016    4:01 PM 10/13/2016   10:36 AM  Advanced Directives  Does Patient Have a Medical Advance Directive? Yes Yes No No Yes  No  Type of Advance Directive Living will Living will   Living will;Healthcare Power of Attorney    Does patient want to make changes to medical advance directive?  No - Patient declined       Copy of [place] Chart?     No - copy requested    Would patient like information on creating a medical advance directive?   No - Patient declined    Yes (ED - Information included in AVS)     Information is confidential and restricted. Go to Review Flowsheets to unlock data.    Current Medications (verified) Outpatient Encounter Medications as of 05/27/2022  Medication Sig   amLODipine (NORVASC) 10 MG tablet  Take 1 tablet (10 mg total) by mouth daily.   brinzolamide (AZOPT) 1 % ophthalmic suspension 1 drop 2 (two) times daily.   latanoprost (XALATAN) 0.005 % ophthalmic solution Place 1 drop into both eyes at bedtime.   losartan (COZAAR) 50 MG tablet Take 2 tablets (100 mg total) by mouth daily. OFFICE VISIT NEEDED FOR FURTHER REFILLS   metoprolol succinate (TOPROL-XL) 25 MG 24 hr tablet Take 1 tablet (25 mg total) by mouth daily.   Multiple Vitamins-Minerals (ICAPS AREDS FORMULA PO) Take 1 capsule by mouth 2 (two) times daily.    ROCKLATAN 0.02-0.005 % SOLN Apply 1 drop to eye at bedtime.   sertraline (ZOLOFT) 50 MG tablet Take 1 tablet (50 mg total) by mouth daily.   timolol (TIMOPTIC) 0.5 % ophthalmic solution    No facility-administered encounter medications on file as of 05/27/2022.    Allergies (verified) Atorvastatin and Prednisone   History: Past Medical History:  Diagnosis Date   Aortic insufficiency 09/2016   mild/mod aortic insufficiency, and mild mitral insufficiency.--Repeat echo 1 yr.   Baker's cyst of knee 02/25/2012   LE Venous Doppler May 30, '13 - non-vascular swelling/mass left popliteal fossa c/w Baker's cyst    CARCINOMA, BREAST, LEFT 1993   Lumpectomy, adjuvant XRT, then tamoxifen.  Gets annual mammograms, no hx of abnormals--as of 2016, patient has chosen to stop getting  screening mammograms.   Chronic renal insufficiency, stage III (moderate) (HCC)    DEPRESSION, SITUATIONAL 03/26/2009   Qualifier: Diagnosis of  By: Linda Hedges MD, Heinz Knuckles    GLAUCOMA 06/07/2007   Qualifier: Diagnosis of  By: [place], [place]     HYPERLIPIDEMIA 06/07/2007   Qualifier: Diagnosis of  By: Danny Lawless CMA, [place]     HYPERTENSION 06/07/2007   Qualifier: Diagnosis of  By: Danny Lawless [place], [place]     HYSTERECTOMY, HX OF 06/07/2007   Qualifier: Diagnosis of  By: Danny Lawless CMA, [place]     Personal history of surgery to other organs 06/07/2007   Centricity Description: BLADDER REPAIR, HX OF  Qualifier: Diagnosis of  By: Danny Lawless CMA, [place]   Centricity Description: LUMPECTOMY, BREAST, HX OF Qualifier: Diagnosis of  By: Danny Lawless CMA, [place]     POSTMENOPAUSAL OSTEOPOROSIS 11/18/2010   Qualifier: Diagnosis of  By: Linda Hedges MD, Heinz Knuckles    Prediabetes 12/2021   April 2023 a1c 6.1%   Past Surgical History:  Procedure Laterality Date   ABDOMINAL HYSTERECTOMY     Benign reasons per pt, but she can't recall exactly what dx.  Ovaries and tubes are still in.     BLADDER REPAIR     BREAST SURGERY  1993   lumpectomy w/adjuvant XRT   EYE SURGERY  Dec 25, 2009   cataract surgery [facility]    TRANSTHORACIC ECHOCARDIOGRAM  10/09/2016   EF 60-65%, normal wall motion, grd I DD, mild/mod aortic regurg, mild mitral regurg.   Family History  Problem Relation Age of Onset   Hyperlipidemia Mother    Hypertension Mother    Cervical cancer Mother    Coronary artery disease Mother    Depression Father    Colon cancer Neg Hx    Diabetes Neg Hx    Social History   Socioeconomic History   Marital status: Married    Spouse name: Not on file   Number of children: 2   Years of education: 12   Highest education level: Not on file  Occupational History   Occupation: homemaker  Tobacco Use   Smoking status: Never   Smokeless tobacco: Never  Vaping Use   Vaping Use: Never used  Substance and Sexual Activity   Alcohol use: No   Drug use: No   Sexual activity: Never  Other Topics Concern   Not on file  Social History Narrative   Married December 25, 2048. 2 sons-'58, '64 oldest died CAD/MI; 2 grandchildren.  From [place], [place] originally.   Homemaker, retired from [employer].   Widowed in the spring of 2017.    Little gardening, handcrafts. ACP/End-of-life Issues: Does not want CPR, mechanical ventilation or heroic measures. She has a living will.   Never smoker.  No alcohol.               Social Determinants of Health   Financial Resource Strain: Low Risk  (05/27/2022)   Overall Financial Resource  Strain (CARDIA)    Difficulty of Paying Living Expenses: Not hard at all  Food Insecurity: No Food Insecurity (05/27/2022)   Hunger Vital Sign    Worried About Running Out of Food in the Last Year: Never true    Ran Out of Food in the Last Year: Never true  Transportation Needs: No Transportation Needs (05/27/2022)   PRAPARE - Hydrologist (Medical): No    Lack of Transportation (Non-Medical): No  Physical Activity: Insufficiently Active (05/27/2022)   Exercise Vital Sign  Days of Exercise per Week: 3 days    Minutes of Exercise per Session: 20 min  Stress: No Stress Concern Present (05/27/2022)   [place]    Feeling of Stress : Not at all  Social Connections: Moderately Integrated (05/27/2022)   Social Connection and Isolation Panel [NHANES]    Frequency of Communication with Friends and Family: More than three times a week    Frequency of Social Gatherings with Friends and Family: Twice a week    Attends Religious Services: More than 4 times per year    Active Member of [employer] or Organizations: Yes    Attends Archivist Meetings: 1 to 4 times per year    Marital Status: Widowed    Tobacco Counseling Counseling given: Not Answered   Clinical Intake:  Pre-visit preparation completed: No  Pain : No/denies pain     Diabetes: No  How often do you need to have someone help you when you read instructions, pamphlets, or other written materials from your doctor or pharmacy?: 1 - Never  Diabetic?no  Interpreter Needed?: No      Activities of Daily Living    05/27/2022   10:00 AM  In your present state of health, do you have any difficulty performing the following activities:  Hearing? 0  Vision? 0  Difficulty concentrating or making decisions? 0  Walking or climbing stairs? 0  Dressing or bathing? 0  Doing errands, shopping? 0  Preparing Food and eating ? N  Using  the Toilet? N  In the past six months, have you accidently leaked urine? N  Do you have problems with loss of bowel control? N  Managing your Medications? N  Managing your Finances? N  Housekeeping or managing your Housekeeping? N    Patient Care Team: Tammi Sou, MD as PCP - General (Family Medicine) Marygrace Drought, MD as Consulting Physician (Ophthalmology) Bridgett Larsson, DDS (Dental General Practice)  Indicate any recent Medical Services you may have received from other than [facility] providers in the past year (date may be approximate).     Assessment:   This is a routine wellness examination for [place].  Hearing/Vision screen No results found.  Dietary issues and exercise activities discussed: Current Exercise Habits: The patient does not participate in regular exercise at present   Goals Addressed   None   Depression Screen    05/27/2022    9:58 AM 12/30/2021   11:38 AM 05/22/2021   12:49 PM 03/15/2020   11:31 AM 02/13/2019    1:58 PM 04/14/2018   11:34 AM 10/15/2017   12:02 PM  PHQ 2/9 Scores  PHQ - 2 Score 0 4 0 0 0 0 0  PHQ- 9 Score     0 0 1    Fall Risk    05/27/2022   10:00 AM 12/30/2021   11:41 AM 05/22/2021   12:41 PM 09/13/2020    9:25 AM 08/23/2019   10:30 AM  Fall Risk   Falls in the past year? 0 0 0 1 0  Comment     Emmi Telephone Survey: data to providers prior to load  Number falls in past yr: 0 0 0 0   Injury with Fall? 0 0 0 0   Follow up Falls evaluation completed Falls evaluation completed Falls evaluation completed;Falls prevention discussed Falls evaluation completed     FALL RISK PREVENTION PERTAINING TO THE HOME:  Any stairs in or around the home?  Yes  If so, are there any without handrails? Yes  Home free of loose throw rugs in walkways, pet beds, electrical cords, etc? Yes  Adequate lighting in your home to reduce risk of falls? Yes   ASSISTIVE DEVICES UTILIZED TO PREVENT FALLS:  Life alert? No  Use of a cane, walker or w/c? No   Grab bars in the bathroom? Yes  Shower chair or bench in shower? Yes  Elevated toilet seat or a handicapped toilet? Yes   TIMED UP AND GO:  Was the test performed? No .  Length of time to ambulate 10 feet: n/a sec.     Cognitive Function:    06/04/2017   10:05 AM  MMSE - Mini Mental State Exam  Orientation to time 5  Orientation to Place 5  Registration 3  Attention/ Calculation 5  Recall 2  Language- name 2 objects 2  Language- repeat 1  Language- follow 3 step command 3  Language- read & follow direction 1  Write a sentence 1  Copy design 1  Total score 29        05/27/2022   10:01 AM 05/22/2021   12:42 PM  6CIT Screen  What Year? 0 points 0 points  What month? 0 points 0 points  What time? 0 points 0 points  Count back from 20 0 points 0 points  Months in reverse 0 points 0 points  Repeat phrase 0 points 0 points  Total Score 0 points 0 points    Immunizations Immunization History  Administered Date(s) Administered   Fluad Quad(high Dose 65+) 08/16/2019, 07/01/2020   Influenza Whole 06/25/2008, 07/29/2009   Influenza, High Dose Seasonal PF 08/14/2013, 08/19/2016, 07/09/2017, 08/05/2018, 05/31/2021   Influenza,inj,Quad PF,6+ Mos 06/25/2014   Influenza-Unspecified 07/09/2015   Pneumococcal Conjugate-13 01/31/2015   Pneumococcal Polysaccharide-23 08/28/2004   Td 11/14/2010   Tdap 03/18/2021    TDAP status: Up to date  Flu Vaccine status: Due, Education has been provided regarding the importance of this vaccine. Advised may receive this vaccine at local pharmacy or Health Dept. Aware to provide a copy of the vaccination record if obtained from local pharmacy or Health Dept. Verbalized acceptance and understanding.  Pneumococcal vaccine status: Up to date  Covid-19 vaccine status: Information provided on how to obtain vaccines.   Qualifies for Shingles Vaccine? Yes   Zostavax completed No   Shingrix Completed?: No.    Education has been provided  regarding the importance of this vaccine. Patient has been advised to call insurance company to determine out of pocket expense if they have not yet received this vaccine. Advised may also receive vaccine at local pharmacy or Health Dept. Verbalized acceptance and understanding.  Screening Tests Health Maintenance  Topic Date Due   COVID-19 Vaccine (1) Never done   Zoster Vaccines- Shingrix (1 of 2) Never done   INFLUENZA VACCINE  04/28/2022   TETANUS/TDAP  03/19/2031   Pneumonia Vaccine 66+ Years old  Completed   DEXA SCAN  Completed   HPV VACCINES  Aged Out    Health Maintenance  Health Maintenance Due  Topic Date Due   COVID-19 Vaccine (1) Never done   Zoster Vaccines- Shingrix (1 of 2) Never done   INFLUENZA VACCINE  04/28/2022    Colorectal cancer screening: No longer required.   Mammogram status: No longer required due to age.  Bone Density status: Completed 11/19/2010. Results reflect: Bone density results: NORMAL. Repeat every n/a years.  Lung Cancer Screening: (Low Dose CT Chest recommended  if Age 44-80 years, 55 pack-year currently smoking OR have quit w/in 15years.) does not qualify.   Lung Cancer Screening Referral: n/a  Additional Screening:  Hepatitis C Screening: does not qualify; Completed n/a  Vision Screening: Recommended annual ophthalmology exams for early detection of glaucoma and other disorders of the eye. Is the patient up to date with their annual eye exam?  No  Who is the provider or what is the name of the office in which the patient attends annual eye exams? Dr Satira Sark If pt is not established with a provider, would they like to be referred to a provider to establish care? No .   Dental Screening: Recommended annual dental exams for proper oral hygiene  Community Resource Referral / Chronic Care Management: CRR required this visit?  No   CCM required this visit?  No      Plan:     I have personally reviewed and noted the following in  the patient's chart:   Medical and social history Use of alcohol, tobacco or illicit drugs  Current medications and supplements including opioid prescriptions. Patient is not currently taking opioid prescriptions. Functional ability and status Nutritional status Physical activity Advanced directives List of other physicians Hospitalizations, surgeries, and ER visits in previous 12 months Vitals Screenings to include cognitive, depression, and falls Referrals and appointments  In addition, I have reviewed and discussed with patient certain preventive protocols, quality metrics, and best practice recommendations. A written personalized care plan for preventive services as well as general preventive health recommendations were provided to patient.     Octaviano Glow, CMA   05/27/2022   Nurse Notes: Non-Face to Face or Face to Face 12 minute visit Encounter   Ms. Herrle , Thank you for taking time to come for your Medicare Wellness Visit. I appreciate your ongoing commitment to your health goals. Please review the following plan we discussed and let me know if I can assist you in the future.   These are the goals we discussed:  Goals       patient (pt-stated)      Maintain current health      Patient Stated      Maintain current lifestyle        This is a list of the screening recommended for you and due dates:  Health Maintenance  Topic Date Due   COVID-19 Vaccine (1) Never done   Zoster (Shingles) Vaccine (1 of 2) Never done   Flu Shot  04/28/2022   Tetanus Vaccine  03/19/2031   Pneumonia Vaccine  Completed   DEXA scan (bone density measurement)  Completed   HPV Vaccine  Aged Out

## 2022-05-27 NOTE — Patient Instructions (Signed)

## 2022-06-07 ENCOUNTER — Other Ambulatory Visit: Payer: Self-pay | Admitting: Family Medicine

## 2022-06-12 IMAGING — DX DG CHEST 1V PORT
1 series · 1 of 1 positions shown · non-contrast
Comparison: None.

CLINICAL DATA: Altered mental status.

EXAM:
PORTABLE CHEST 1 VIEW

[chest]
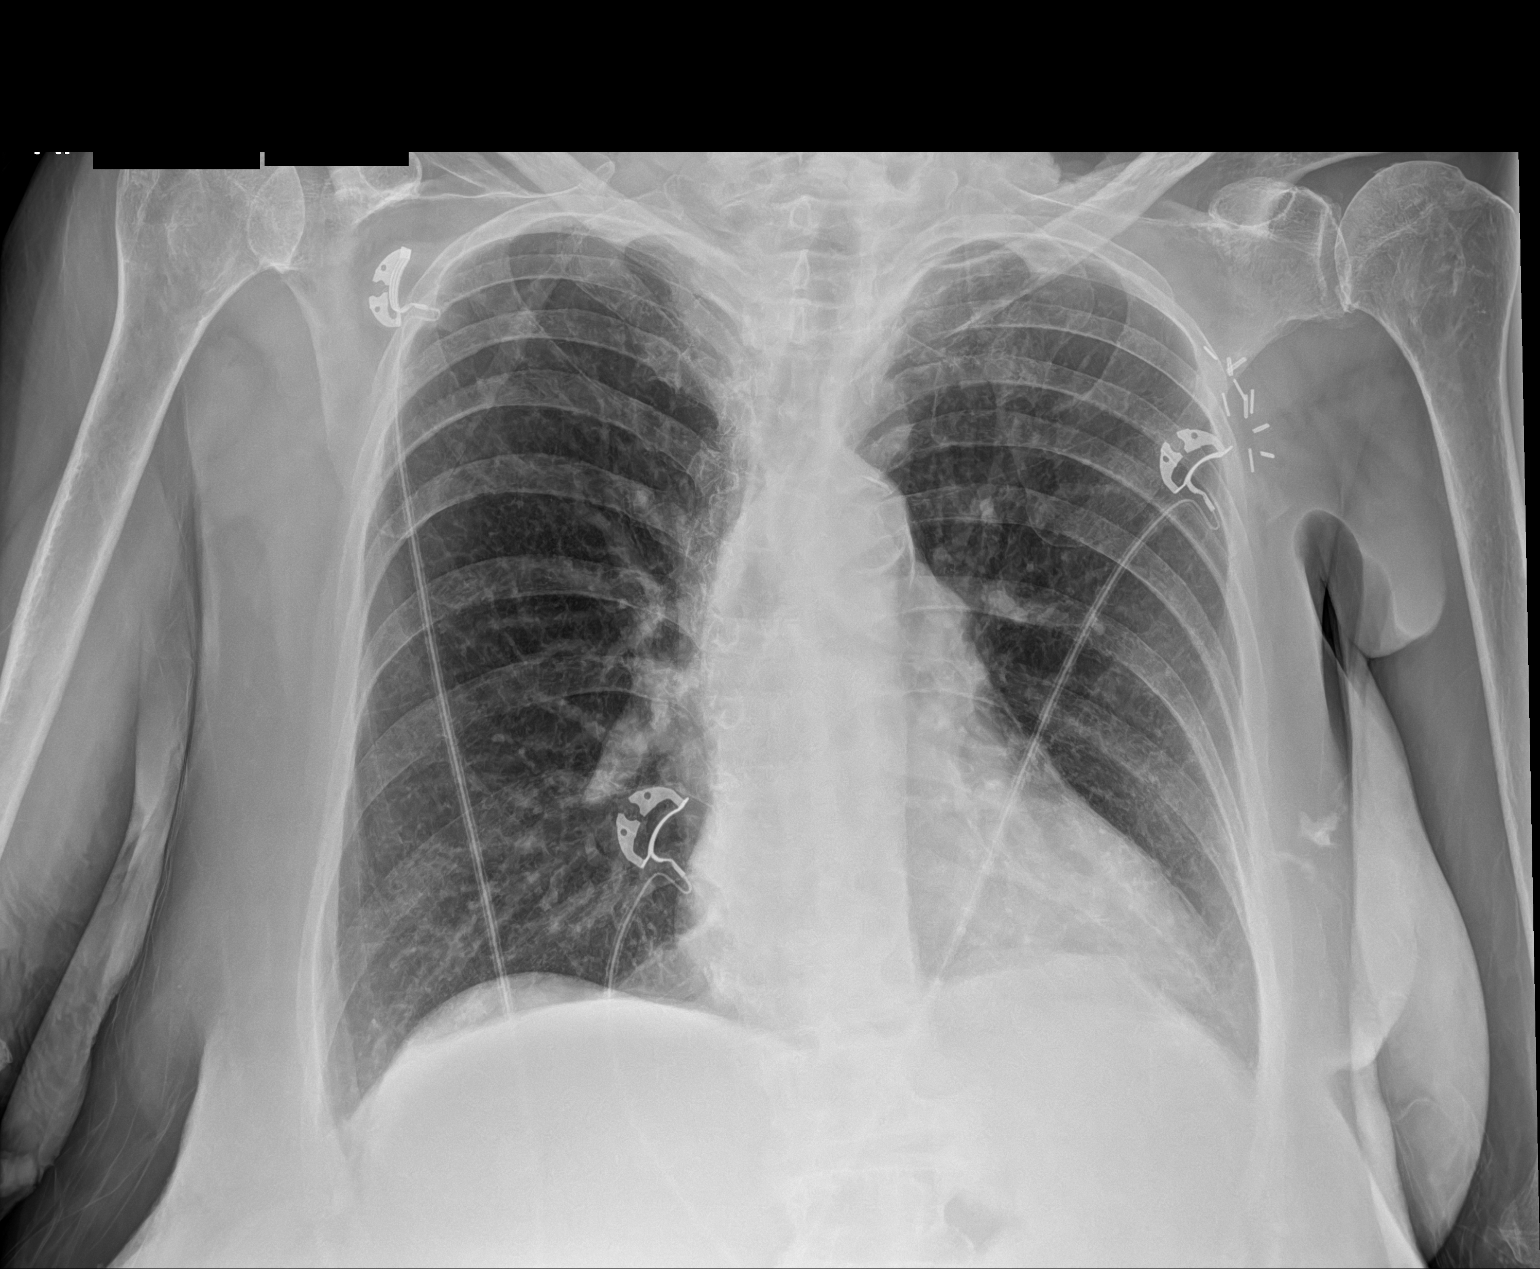

[1 of 1 positions shown; findings below may reference images not displayed]

FINDINGS: 1482 hours. The lungs are clear without focal pneumonia, edema,
pneumothorax or pleural effusion. The cardiopericardial silhouette
is within normal limits for size. The visualized bony structures of
the thorax show no acute abnormality. Telemetry leads overlie the
chest.
IMPRESSION: No active disease.

## 2022-06-16 DIAGNOSIS — H353134 Nonexudative age-related macular degeneration, bilateral, advanced atrophic with subfoveal involvement: Secondary | ICD-10-CM | POA: Diagnosis not present

## 2022-06-16 DIAGNOSIS — H52203 Unspecified astigmatism, bilateral: Secondary | ICD-10-CM | POA: Diagnosis not present

## 2022-06-16 DIAGNOSIS — H26493 Other secondary cataract, bilateral: Secondary | ICD-10-CM | POA: Diagnosis not present

## 2022-06-16 DIAGNOSIS — H401113 Primary open-angle glaucoma, right eye, severe stage: Secondary | ICD-10-CM | POA: Diagnosis not present

## 2022-06-16 DIAGNOSIS — H33302 Unspecified retinal break, left eye: Secondary | ICD-10-CM | POA: Diagnosis not present

## 2022-06-16 DIAGNOSIS — H524 Presbyopia: Secondary | ICD-10-CM | POA: Diagnosis not present

## 2022-06-16 DIAGNOSIS — H02102 Unspecified ectropion of right lower eyelid: Secondary | ICD-10-CM | POA: Diagnosis not present

## 2022-06-16 DIAGNOSIS — H02105 Unspecified ectropion of left lower eyelid: Secondary | ICD-10-CM | POA: Diagnosis not present

## 2022-08-10 ENCOUNTER — Telehealth: Payer: Self-pay

## 2022-08-10 ENCOUNTER — Encounter: Payer: Self-pay | Admitting: Family Medicine

## 2022-08-10 ENCOUNTER — Ambulatory Visit (INDEPENDENT_AMBULATORY_CARE_PROVIDER_SITE_OTHER): Payer: Medicare Other | Admitting: Family Medicine

## 2022-08-10 VITALS — BP 194/75 | HR 77 | Temp 97.2°F | Ht 60.0 in | Wt 133.2 lb

## 2022-08-10 DIAGNOSIS — L03211 Cellulitis of face: Secondary | ICD-10-CM

## 2022-08-10 DIAGNOSIS — R5383 Other fatigue: Secondary | ICD-10-CM | POA: Diagnosis not present

## 2022-08-10 DIAGNOSIS — I10 Essential (primary) hypertension: Secondary | ICD-10-CM | POA: Diagnosis not present

## 2022-08-10 DIAGNOSIS — Z91148 Patient's other noncompliance with medication regimen for other reason: Secondary | ICD-10-CM | POA: Diagnosis not present

## 2022-08-10 DIAGNOSIS — R4182 Altered mental status, unspecified: Secondary | ICD-10-CM

## 2022-08-10 DIAGNOSIS — N390 Urinary tract infection, site not specified: Secondary | ICD-10-CM | POA: Diagnosis not present

## 2022-08-10 LAB — POCT URINALYSIS DIPSTICK
Bilirubin, UA: NEGATIVE
Glucose, UA: NEGATIVE
Nitrite, UA: POSITIVE
Protein, UA: POSITIVE — AB
Spec Grav, UA: 1.025 (ref 1.010–1.025)
Urobilinogen, UA: 1 E.U./dL
pH, UA: 6 (ref 5.0–8.0)

## 2022-08-10 MED ORDER — CEFDINIR 300 MG PO CAPS: 300.0000 mg | ORAL_CAPSULE | Freq: Two times a day (BID) | ORAL | 0 refills | Status: DC

## 2022-08-10 MED ORDER — CEFTRIAXONE SODIUM 1 G IJ SOLR
1.0000 g | Freq: Once | INTRAMUSCULAR | Status: AC
  Administered 2022-08-10: 1 g via INTRAMUSCULAR

## 2022-08-10 NOTE — Addendum Note (Signed)
Addended by: Deveron Furlong D on: 08/10/2022 05:07 PM   Modules accepted: Orders

## 2022-08-10 NOTE — Progress Notes (Signed)
OFFICE VISIT  08/10/2022  CC:  Chief Complaint  Patient presents with   Not feeling well    Does not feel like herself; she is not as alert and on top of everything but would like to be.     Patient is a 88 y.o. female who presents accompanied by her son for "not feeling well, does not feel like herself ".  HPI: About 1 week history of not feeling like herself. Son says she has been very irritable at times during the last week. There was a red bump on her temple and it started to open up and now the whole area around her left eye is red and swollen.  There is a large scab present. She denies any eye pain or itching. Denies fevers, nausea, vomiting, or vision abnormality.  States she is not having any trouble with her urine. She states she is eating and drinking fine.  Past Medical History:  Diagnosis Date   Aortic insufficiency 09/2016   mild/mod aortic insufficiency, and mild mitral insufficiency.--Repeat echo 1 yr.   Baker's cyst of knee 02/25/2012   LE Venous Doppler May 30, '13 - non-vascular swelling/mass left popliteal fossa c/w Baker's cyst    CARCINOMA, BREAST, LEFT 1993   Lumpectomy, adjuvant XRT, then tamoxifen.  Gets annual mammograms, no hx of abnormals--as of 2016, patient has chosen to stop getting screening mammograms.   Chronic renal insufficiency, stage III (moderate) (HCC)    DEPRESSION, SITUATIONAL 03/26/2009   Qualifier: Diagnosis of  By: Linda Hedges MD, Heinz Knuckles    GLAUCOMA 06/07/2007   Qualifier: Diagnosis of  By: [place], [place]     HYPERLIPIDEMIA 06/07/2007   Qualifier: Diagnosis of  By: Danny Lawless CMA, [place]     HYPERTENSION 06/07/2007   Qualifier: Diagnosis of  By: Danny Lawless [place], [place]     HYSTERECTOMY, HX OF 06/07/2007   Qualifier: Diagnosis of  By: Danny Lawless CMA, [place]     Personal history of surgery to other organs 06/07/2007   Centricity Description: BLADDER REPAIR, HX OF Qualifier: Diagnosis of  By: Danny Lawless CMA, [place]   Centricity Description:  LUMPECTOMY, BREAST, HX OF Qualifier: Diagnosis of  By: Danny Lawless CMA, [place]     POSTMENOPAUSAL OSTEOPOROSIS 11/18/2010   Qualifier: Diagnosis of  By: Linda Hedges MD, Heinz Knuckles    Prediabetes 12/2021   April 2023 a1c 6.1%    Past Surgical History:  Procedure Laterality Date   ABDOMINAL HYSTERECTOMY     Benign reasons per pt, but she can't recall exactly what dx.  Ovaries and tubes are still in.     BLADDER REPAIR     BREAST SURGERY  1993   lumpectomy w/adjuvant XRT   EYE SURGERY  2011   cataract surgery [facility]    TRANSTHORACIC ECHOCARDIOGRAM  10/09/2016   EF 60-65%, normal wall motion, grd I DD, mild/mod aortic regurg, mild mitral regurg.    Outpatient Medications Prior to Visit  Medication Sig Dispense Refill   amLODipine (NORVASC) 10 MG tablet TAKE 1 TABLET BY MOUTH EVERY DAY 90 tablet 0   brinzolamide (AZOPT) 1 % ophthalmic suspension 1 drop 2 (two) times daily.     latanoprost (XALATAN) 0.005 % ophthalmic solution Place 1 drop into both eyes at bedtime.     losartan (COZAAR) 50 MG tablet Take 2 tablets (100 mg total) by mouth daily. OFFICE VISIT NEEDED FOR FURTHER REFILLS 180 tablet 1   metoprolol succinate (TOPROL-XL) 25 MG 24 hr tablet Take 1 tablet (25 mg total) by mouth daily.  90 tablet 1   Multiple Vitamins-Minerals (ICAPS AREDS FORMULA PO) Take 1 capsule by mouth 2 (two) times daily.      ROCKLATAN 0.02-0.005 % SOLN Apply 1 drop to eye at bedtime.     sertraline (ZOLOFT) 50 MG tablet TAKE 1 TABLET BY MOUTH EVERY DAY 90 tablet 0   timolol (TIMOPTIC) 0.5 % ophthalmic solution      No facility-administered medications prior to visit.    Allergies  Allergen Reactions   Atorvastatin Other (See Comments)    Leg myalgias   Prednisone Other (See Comments)    Various cognitive/psychiatric side effects    ROS As per HPI  PE:    08/10/2022    3:53 PM 04/15/2022   10:05 AM 04/15/2022    9:58 AM  Vitals with BMI  Height '5\' 0"'$     Weight 133 lbs 3 oz  136 lbs 3 oz  BMI 88.50     Systolic [phone]  Diastolic 75 68 67  Pulse 77  66   Physical Exam  Gen: Alert, anxious-appearing.  No distress.  She attends and interacts appropriately but has to defer often to her son to answer specific questions.   Just lateral to the left eye there is a large scab that has some surrounding erythema involving the upper and lower lid.  There is no nodularity or fluctuance. EOMI, PERRL. With cutting back some of the scab this loose this releases a little bit of yellowish mucus. Mouth: lips without lesion/swelling.  Oral mucosa pink and moist. Oropharynx without erythema, exudate, or swelling.  Cardiovascular shows regular rhythm and rate, no murmur. Lungs are clear bilaterally, breathing is nonlabored. Extremities: No cyanosis or edema.   LABS:  Last CBC Lab Results  Component Value Date   WBC 9.6 12/30/2021   HGB 12.5 12/30/2021   HCT 38.0 12/30/2021   MCV 96.9 12/30/2021   MCH 31.1 08/17/2021   RDW 13.1 12/30/2021   PLT 333.0 67/67/2094   Last metabolic panel Lab Results  Component Value Date   GLUCOSE 163 (H) 12/30/2021   NA 140 12/30/2021   K 4.0 12/30/2021   CL 103 12/30/2021   CO2 25 12/30/2021   BUN 17 12/30/2021   CREATININE 1.11 12/30/2021   GFRNONAA >60 08/17/2021   CALCIUM 10.0 12/30/2021   PROT 6.8 08/17/2021   ALBUMIN 3.9 08/17/2021   BILITOT 0.6 08/17/2021   ALKPHOS 98 08/17/2021   AST 14 (L) 08/17/2021   ALT 7 08/17/2021   ANIONGAP 11 08/17/2021   Last hemoglobin A1c Lab Results  Component Value Date   HGBA1C 6.1 12/31/2021   Last thyroid functions Lab Results  Component Value Date   TSH 3.17 09/29/2016   IMPRESSION AND PLAN:  #1 periorbital cellulitis.  Sounds like this started with a small infected papule. I cut the loose part of the scab off.  Needs to soak with this area. We will try to remove any loose scab again when she returns for recheck in 2 days. Rocephin 1 g IM in office today.  Tomorrow Start cefdinir 300 bid x  6d  #2 fatigue and nonspecific worsening of mild cognitive impairment. In the past this is how she is acted when she has a UTI. CC UA today: Blood, nitrite positive, leukocytes all present. Sent urine for culture and sensitivity. Rocephin and cefdinir as per #1 above.  #3 uncontrolled hypertension. Son states that she is noncompliant with medications frequently. We will get her feeling better from an infection  standpoint and then discuss this issue with her in more detail. We made need to get home health nursing back out to help her briefly.  Spent 45 min with pt today reviewing HPI, reviewing relevant past history, doing exam, reviewing and discussing lab data, trimming scab near her eye, discussing wound care, and formulating plans.  Signs/symptoms to call or return for were reviewed and pt and her son expressed understanding.  An After Visit Summary was printed and given to the patient.  FOLLOW UP: Return for Keep follow-up appointment already scheduled for 11/15.  Signed:  Crissie Sickles, MD           08/10/2022

## 2022-08-10 NOTE — Telephone Encounter (Signed)
No record of patient going to ED. Pt has appt with PCP on 11/15  [facility] [facility] [place] REC[facility] Patient Name: Amy Turner Gender: Female DOB: 04-03-1926 Age: 88 Y 1 M 19 D Return Phone Number: [phone] (Primary) Address: City/ State/ Zip: [place]  [address] Client [facility] - Client Client Site [facility] - Day Provider Crissie Sickles - MD Contact Type Call Who Is Calling Patient / Member / Family / Caregiver Call Type Triage / Clinical Relationship To Patient Self Return Phone Number [phone] (Primary) Chief Complaint CONFUSION - new onset Reason for Call Symptomatic / Request for [place] says that she is not thinking well; she does not know what happened this morning but something did. Translation No Nurse Assessment Nurse: Thad Ranger, RN, Denise Date/Time (Eastern Time): 08/07/2022 11:13:51 AM Confirm and document reason for call. If symptomatic, describe symptoms. ---Pt states she feels confused. She is able to follow conversation w/o difficulty and is a/o x 3. Does the patient have any new or worsening symptoms? ---Yes Will a triage be completed? ---Yes Related visit to physician within the last 2 weeks? ---No Does the PT have any chronic conditions? (i.e. diabetes, asthma, this includes High risk factors for pregnancy, etc.) ---No Is this a behavioral health or substance abuse call? ---No Guidelines Guideline Title Affirmed Question Affirmed Notes Nurse Date/Time (Eastern Time) Confusion - Delirium [1] Difficult to awaken or acting confused (e.g., disoriented, slurred speech) AND [2] present now AND [3] new-onset Carmon, RN, Denise 08/07/2022 11:16:16 AM Disp. Time [name] Time) Disposition Final User 08/07/2022 11:11:57 AM Send to Urgent Micheal Likens 08/07/2022 11:19:10 AM Call EMS 911 Now Yes Carmon,  RN, Langley Gauss PLEASE NOTE: All timestamps contained within this report are represented as [place] Standard Time. CONFIDENTIALTY NOTICE: This fax transmission is intended only for the addressee. It contains information that is legally privileged, confidential or otherwise protected from use or disclosure. If you are not the intended recipient, you are strictly prohibited from reviewing, disclosing, copying using or disseminating any of this information or taking any action in reliance on or regarding this information. If you have received this fax in error, please notify us immediately by telephone so that we can arrange for its return to [place]. Phone: [phone], Toll-Free: [phone], Fax: [phone] Page: 2 of 2 Call Id: [id number] [place]. Time [name] Time) Disposition Final User 08/07/2022 11:26:03 AM 911 Outcome Documentation Carmon, RN, Langley Gauss Reason: 911 cb complete w/vmm left w/o phi. Final Disposition 08/07/2022 11:19:10 AM Call EMS 911 Now Yes Carmon, RN, Yevette Edwards Disagree/Comply Comply Caller Understands Yes PreDisposition Call Doctor Care Advice Given Per Guideline CALL EMS 911 NOW: CARE ADVICE given per Confusion-Delirium (Adult) guideline.

## 2022-08-11 LAB — CBC WITH DIFFERENTIAL/PLATELET
Basophils Absolute: 0.1 10*3/uL (ref 0.0–0.1)
Basophils Relative: 1.2 % (ref 0.0–3.0)
Eosinophils Absolute: 0.2 10*3/uL (ref 0.0–0.7)
Eosinophils Relative: 2.4 % (ref 0.0–5.0)
HCT: 38.2 % (ref 36.0–46.0)
Hemoglobin: 12.4 g/dL (ref 12.0–15.0)
Lymphocytes Relative: 20.9 % (ref 12.0–46.0)
Lymphs Abs: 1.9 10*3/uL (ref 0.7–4.0)
MCHC: 32.3 g/dL (ref 30.0–36.0)
MCV: 95.8 fl (ref 78.0–100.0)
Monocytes Absolute: 0.7 10*3/uL (ref 0.1–1.0)
Monocytes Relative: 7.5 % (ref 3.0–12.0)
Neutro Abs: 6.1 10*3/uL (ref 1.4–7.7)
Neutrophils Relative %: 68 % (ref 43.0–77.0)
Platelets: 242 10*3/uL (ref 150.0–400.0)
RBC: 3.99 Mil/uL (ref 3.87–5.11)
RDW: 13.3 % (ref 11.5–15.5)
WBC: 9 10*3/uL (ref 4.0–10.5)

## 2022-08-11 LAB — BASIC METABOLIC PANEL
BUN: 20 mg/dL (ref 6–23)
CO2: 27 mEq/L (ref 19–32)
Calcium: 9.6 mg/dL (ref 8.4–10.5)
Chloride: 104 mEq/L (ref 96–112)
Creatinine, Ser: 0.88 mg/dL (ref 0.40–1.20)
GFR: 56.36 mL/min — ABNORMAL LOW (ref >60.00)
Glucose, Bld: 135 mg/dL — ABNORMAL HIGH (ref 70–99)
Potassium: 3.7 mEq/L (ref 3.5–5.1)
Sodium: 140 mEq/L (ref 135–145)

## 2022-08-12 ENCOUNTER — Encounter: Payer: Self-pay | Admitting: Family Medicine

## 2022-08-12 ENCOUNTER — Ambulatory Visit (INDEPENDENT_AMBULATORY_CARE_PROVIDER_SITE_OTHER): Payer: Medicare Other | Admitting: Family Medicine

## 2022-08-12 ENCOUNTER — Ambulatory Visit: Payer: Medicare Other | Admitting: Family Medicine

## 2022-08-12 VITALS — BP 160/80 | HR 93 | Temp 97.1°F | Ht 60.0 in | Wt 134.0 lb

## 2022-08-12 DIAGNOSIS — I10 Essential (primary) hypertension: Secondary | ICD-10-CM | POA: Diagnosis not present

## 2022-08-12 DIAGNOSIS — R4189 Other symptoms and signs involving cognitive functions and awareness: Secondary | ICD-10-CM

## 2022-08-12 DIAGNOSIS — N3001 Acute cystitis with hematuria: Secondary | ICD-10-CM

## 2022-08-12 DIAGNOSIS — L03213 Periorbital cellulitis: Secondary | ICD-10-CM | POA: Diagnosis not present

## 2022-08-12 NOTE — Progress Notes (Signed)
OFFICE VISIT  08/12/2022  CC:  Chief Complaint  Patient presents with   Follow-up    Patient is a 88 y.o. female who presents accompanied by her son for 2-day follow-up periorbital cellulitis, UTI, and uncontrolled hypertension. A/P as of last visit: "#1 periorbital cellulitis.  Sounds like this started with a small infected papule. I cut the loose part of the scab off.  Needs to soak with this area. We will try to remove any loose scab again when she returns for recheck in 2 days. Rocephin 1 g IM in office today.  Tomorrow Start cefdinir 300 bid x 6d   #2 fatigue and nonspecific worsening of mild cognitive impairment. In the past this is how she is acted when she has a UTI. CC UA today: Blood, nitrite positive, leukocytes all present. Sent urine for culture and sensitivity. Rocephin and cefdinir as per #1 above.   #3 uncontrolled hypertension. Son states that she is noncompliant with medications frequently. We will get her feeling better from an infection standpoint and then discuss this issue with her in more detail. We made need to get home health nursing back out to help her briefly."  INTERIM HX: CBC and c-Met normal last visit. Urine culture grew E. coli, preliminary.  She started the [facility] yesterday evening. Denies urinary symptoms, denies fever. No pain or itching in left orbital region. Mentation/cognitive function still not back up to her baseline.  Her son did get her to take her blood pressure medication this morning.  She made herself meals today and ate well.  ROS as above, plus-->no dizziness, no HAs, No myalgias or arthralgias.  No focal weakness, paresthesias, or tremors.  No acute vision abnormalities.   No recent changes in lower legs. No n/v/d or abd pain.   Past Medical History:  Diagnosis Date   Aortic insufficiency 09/2016   mild/mod aortic insufficiency, and mild mitral insufficiency.--Repeat echo 1 yr.   Baker's cyst of knee 02/25/2012   LE  Venous Doppler May 30, '13 - non-vascular swelling/mass left popliteal fossa c/w Baker's cyst    CARCINOMA, BREAST, LEFT 1993   Lumpectomy, adjuvant XRT, then tamoxifen.  Gets annual mammograms, no hx of abnormals--as of 2016, patient has chosen to stop getting screening mammograms.   Chronic renal insufficiency, stage III (moderate) (HCC)    DEPRESSION, SITUATIONAL 03/26/2009   Qualifier: Diagnosis of  By: Linda Hedges MD, Heinz Knuckles    GLAUCOMA 06/07/2007   Qualifier: Diagnosis of  By: [place], [place]     HYPERLIPIDEMIA 06/07/2007   Qualifier: Diagnosis of  By: Danny Lawless CMA, [place]     HYPERTENSION 06/07/2007   Qualifier: Diagnosis of  By: Danny Lawless [place], [place]     HYSTERECTOMY, HX OF 06/07/2007   Qualifier: Diagnosis of  By: Danny Lawless CMA, [place]     Personal history of surgery to other organs 06/07/2007   Centricity Description: BLADDER REPAIR, HX OF Qualifier: Diagnosis of  By: Danny Lawless CMA, [place]   Centricity Description: LUMPECTOMY, BREAST, HX OF Qualifier: Diagnosis of  By: Danny Lawless CMA, [place]     POSTMENOPAUSAL OSTEOPOROSIS 11/18/2010   Qualifier: Diagnosis of  By: Linda Hedges MD, Heinz Knuckles    Prediabetes 12/2021   April 2023 a1c 6.1%    Past Surgical History:  Procedure Laterality Date   ABDOMINAL HYSTERECTOMY     Benign reasons per pt, but she can't recall exactly what dx.  Ovaries and tubes are still in.     BLADDER REPAIR     BREAST SURGERY  1993   lumpectomy w/adjuvant XRT   EYE SURGERY  2011   cataract surgery [facility]    TRANSTHORACIC ECHOCARDIOGRAM  10/09/2016   EF 60-65%, normal wall motion, grd I DD, mild/mod aortic regurg, mild mitral regurg.    Outpatient Medications Prior to Visit  Medication Sig Dispense Refill   amLODipine (NORVASC) 10 MG tablet TAKE 1 TABLET BY MOUTH EVERY DAY 90 tablet 0   brinzolamide (AZOPT) 1 % ophthalmic suspension 1 drop 2 (two) times daily.     cefdinir (OMNICEF) 300 MG capsule Take 1 capsule (300 mg total) by mouth 2 (two) times daily. 12  capsule 0   latanoprost (XALATAN) 0.005 % ophthalmic solution Place 1 drop into both eyes at bedtime.     losartan (COZAAR) 50 MG tablet Take 2 tablets (100 mg total) by mouth daily. OFFICE VISIT NEEDED FOR FURTHER REFILLS 180 tablet 1   metoprolol succinate (TOPROL-XL) 25 MG 24 hr tablet Take 1 tablet (25 mg total) by mouth daily. 90 tablet 1   Multiple Vitamins-Minerals (ICAPS AREDS FORMULA PO) Take 1 capsule by mouth 2 (two) times daily.      ROCKLATAN 0.02-0.005 % SOLN Apply 1 drop to eye at bedtime.     sertraline (ZOLOFT) 50 MG tablet TAKE 1 TABLET BY MOUTH EVERY DAY 90 tablet 0   timolol (TIMOPTIC) 0.5 % ophthalmic solution      No facility-administered medications prior to visit.    Allergies  Allergen Reactions   Atorvastatin Other (See Comments)    Leg myalgias   Prednisone Other (See Comments)    Various cognitive/psychiatric side effects    ROS As per HPI  PE:    08/12/2022    4:12 PM 08/12/2022    3:57 PM 08/10/2022    3:53 PM  Vitals with BMI  Height  _0  _1   Weight  134 lbs 133 lbs 3 oz  BMI  71.69 67.89  Systolic 381 017 510  Diastolic 80 79 75  Pulse  93 77   Physical Exam  Gen: Alert, tired-appearing but does not appear acutely ill.   Attends well, interacts appropriately. Left periorbital region with just a hint of erythema/deep pink hue.  Minimal lid swelling.  Large scab left temple. I cut away the loose part of the scab and there was a fair amount of yellowish mucus beneath it. Cardiovascular: Regular rhythm and rate Lungs are clear, breathing nonlabored.  LABS:  Last CBC Lab Results  Component Value Date   WBC 9.0 08/10/2022   HGB 12.4 08/10/2022   HCT 38.2 08/10/2022   MCV 95.8 08/10/2022   MCH 31.1 08/17/2021   RDW 13.3 08/10/2022   PLT 242.0 25/85/2778   Last metabolic panel Lab Results  Component Value Date   GLUCOSE 135 (H) 08/10/2022   NA 140 08/10/2022   K 3.7 08/10/2022   CL 104 08/10/2022   CO2 27 08/10/2022   BUN  20 08/10/2022   CREATININE 0.88 08/10/2022   GFRNONAA >60 08/17/2021   CALCIUM 9.6 08/10/2022   PROT 6.8 08/17/2021   ALBUMIN 3.9 08/17/2021   BILITOT 0.6 08/17/2021   ALKPHOS 98 08/17/2021   AST 14 (L) 08/17/2021   ALT 7 08/17/2021   ANIONGAP 11 08/17/2021   IMPRESSION AND PLAN:  #1 UTI, culture shows E. coli, sensitivities are pending. Continue cefdinir, adjust as appropriate.  2.  Cognitive impairment, acute on chronic. Suspect secondary to #1, expect to get back to baseline over the next 3 to 4  days.  #3 periorbital cellulitis. We will continue to try to cut off any loose scab.  I think this is improving. Continue warm washcloth soaks.  #4 hypertension, poor control.  Question compliance with medication. We went over her medications in great detail today and set them out in appropriate pillboxes.  An After Visit Summary was printed and given to the patient.  FOLLOW UP: Return in about 2 days (around 08/14/2022) for f/u uti, eye, cognitive, bp.  Signed:  Crissie Sickles, MD           08/12/2022

## 2022-08-13 LAB — URINE CULTURE
MICRO NUMBER:: 14180589
SPECIMEN QUALITY:: ADEQUATE

## 2022-08-14 ENCOUNTER — Ambulatory Visit (INDEPENDENT_AMBULATORY_CARE_PROVIDER_SITE_OTHER): Payer: Medicare Other | Admitting: Family Medicine

## 2022-08-14 ENCOUNTER — Encounter: Payer: Self-pay | Admitting: Family Medicine

## 2022-08-14 VITALS — BP 192/75 | HR 74 | Temp 97.5°F | Ht 60.0 in | Wt 135.4 lb

## 2022-08-14 DIAGNOSIS — L03213 Periorbital cellulitis: Secondary | ICD-10-CM | POA: Diagnosis not present

## 2022-08-14 DIAGNOSIS — N390 Urinary tract infection, site not specified: Secondary | ICD-10-CM | POA: Diagnosis not present

## 2022-08-14 DIAGNOSIS — R4189 Other symptoms and signs involving cognitive functions and awareness: Secondary | ICD-10-CM

## 2022-08-14 DIAGNOSIS — I10 Essential (primary) hypertension: Secondary | ICD-10-CM

## 2022-08-14 MED ORDER — SERTRALINE HCL 50 MG PO TABS: 50.0000 mg | ORAL_TABLET | Freq: Every day | ORAL | 1 refills | Status: DC

## 2022-08-14 NOTE — Progress Notes (Unsigned)
OFFICE VISIT  08/19/2022  CC:  Chief Complaint  Patient presents with   Follow-up   Patient is a 88 y.o. female who presents accompanied by her niece Selinda Eon for 2-day follow-up UTI, periorbital cellulitis, and acute on chronic cognitive impairment. A/P as of last visit: "1 UTI, culture shows E. coli, sensitivities are pending. Continue cefdinir, adjust as appropriate.   2.  Cognitive impairment, acute on chronic. Suspect secondary to #1, expect to get back to baseline over the next 3 to 4 days.   #3 periorbital cellulitis. We will continue to try to cut off any loose scab.  I think this is improving. Continue warm washcloth soaks.   #4 hypertension, poor control.  Question compliance with medication. We went over her medications in great detail today and set them out in appropriate pillboxes."  INTERIM HX: Her urine culture from 08/10/2022 final report was E. coli, pansensitive. Doing just a little bit better.  Turns out she has not been taking her Zoloft lately, unsure how long she has been off it likely quite a while.  This has happened before and resulted in the cognitive and anxiety symptoms that she has been having lately. Scab over left temple/eye looking okay.  No home blood pressure monitoring to report.  Past Medical History:  Diagnosis Date   Aortic insufficiency 09/2016   mild/mod aortic insufficiency, and mild mitral insufficiency.--Repeat echo 1 yr.   Baker's cyst of knee 02/25/2012   LE Venous Doppler May 30, '13 - non-vascular swelling/mass left popliteal fossa c/w Baker's cyst    CARCINOMA, BREAST, LEFT 1993   Lumpectomy, adjuvant XRT, then tamoxifen.  Gets annual mammograms, no hx of abnormals--as of 2016, patient has chosen to stop getting screening mammograms.   Chronic renal insufficiency, stage III (moderate) (HCC)    DEPRESSION, SITUATIONAL 03/26/2009   Qualifier: Diagnosis of  By: Linda Hedges MD, Heinz Knuckles    GLAUCOMA 06/07/2007   Qualifier: Diagnosis of   By: [place], [place]     HYPERLIPIDEMIA 06/07/2007   Qualifier: Diagnosis of  By: Danny Lawless CMA, [place]     HYPERTENSION 06/07/2007   Qualifier: Diagnosis of  By: Danny Lawless [place], [place]     HYSTERECTOMY, HX OF 06/07/2007   Qualifier: Diagnosis of  By: Danny Lawless CMA, [place]     Personal history of surgery to other organs 06/07/2007   Centricity Description: BLADDER REPAIR, HX OF Qualifier: Diagnosis of  By: Danny Lawless CMA, [place]   Centricity Description: LUMPECTOMY, BREAST, HX OF Qualifier: Diagnosis of  By: Danny Lawless CMA, [place]     POSTMENOPAUSAL OSTEOPOROSIS 11/18/2010   Qualifier: Diagnosis of  By: Linda Hedges MD, Heinz Knuckles    Prediabetes 12/2021   April 2023 a1c 6.1%    Past Surgical History:  Procedure Laterality Date   ABDOMINAL HYSTERECTOMY     Benign reasons per pt, but she can't recall exactly what dx.  Ovaries and tubes are still in.     BLADDER REPAIR     BREAST SURGERY  1993   lumpectomy w/adjuvant XRT   EYE SURGERY  2011   cataract surgery [facility]    TRANSTHORACIC ECHOCARDIOGRAM  10/09/2016   EF 60-65%, normal wall motion, grd I DD, mild/mod aortic regurg, mild mitral regurg.    Outpatient Medications Prior to Visit  Medication Sig Dispense Refill   amLODipine (NORVASC) 10 MG tablet TAKE 1 TABLET BY MOUTH EVERY DAY 90 tablet 0   brinzolamide (AZOPT) 1 % ophthalmic suspension 1 drop 2 (two) times daily.     cefdinir (  OMNICEF) 300 MG capsule Take 1 capsule (300 mg total) by mouth 2 (two) times daily. 12 capsule 0   latanoprost (XALATAN) 0.005 % ophthalmic solution Place 1 drop into both eyes at bedtime.     losartan (COZAAR) 50 MG tablet Take 2 tablets (100 mg total) by mouth daily. OFFICE VISIT NEEDED FOR FURTHER REFILLS 180 tablet 1   metoprolol succinate (TOPROL-XL) 25 MG 24 hr tablet Take 1 tablet (25 mg total) by mouth daily. 90 tablet 1   Multiple Vitamins-Minerals (ICAPS AREDS FORMULA PO) Take 1 capsule by mouth 2 (two) times daily.      ROCKLATAN 0.02-0.005 % SOLN Apply 1  drop to eye at bedtime.     timolol (TIMOPTIC) 0.5 % ophthalmic solution      sertraline (ZOLOFT) 50 MG tablet TAKE 1 TABLET BY MOUTH EVERY DAY 90 tablet 0   No facility-administered medications prior to visit.    Allergies  Allergen Reactions   Atorvastatin Other (See Comments)    Leg myalgias   Prednisone Other (See Comments)    Various cognitive/psychiatric side effects    ROS As per HPI  PE:    08/14/2022    3:30 PM 08/12/2022    4:12 PM 08/12/2022    3:57 PM  Vitals with BMI  Height '5\' 0"'$   '5\' 0"'$   Weight 135 lbs 6 oz  134 lbs  BMI 93.73  42.87  Systolic 681 157 262  Diastolic 75 80 79  Pulse 74  93   Physical Exam  Gen: Alert, well appearing.  Patient is oriented to person, place, time, and situation. Attends well, interacts appropriately, somewhat anxious as per her baseline. L temple with large scab, no fluctuance or surrounding erythema.  LABS:  Last CBC Lab Results  Component Value Date   WBC 9.0 08/10/2022   HGB 12.4 08/10/2022   HCT 38.2 08/10/2022   MCV 95.8 08/10/2022   MCH 31.1 08/17/2021   RDW 13.3 08/10/2022   PLT 242.0 03/55/9741   Last metabolic panel Lab Results  Component Value Date   GLUCOSE 135 (H) 08/10/2022   NA 140 08/10/2022   K 3.7 08/10/2022   CL 104 08/10/2022   CO2 27 08/10/2022   BUN 20 08/10/2022   CREATININE 0.88 08/10/2022   GFRNONAA >60 08/17/2021   CALCIUM 9.6 08/10/2022   PROT 6.8 08/17/2021   ALBUMIN 3.9 08/17/2021   BILITOT 0.6 08/17/2021   ALKPHOS 98 08/17/2021   AST 14 (L) 08/17/2021   ALT 7 08/17/2021   ANIONGAP 11 08/17/2021   IMPRESSION AND PLAN:  #1 UTI.  Suspect contributing to most recent cognitive impairment.  She is almost finished with antibiotics for this (cefdinir).  #2 left periorbital cellulitis, large scab. I cut just a tiny bit of this off today.  Continue soaks and expect gradual resolution.  No sign of acute infection in the area at this time.  3.  Uncontrolled hypertension.  She has  had some difficulty with medications, remembering to take them, take them correctly etc. She is getting this straightened out at home with the help of her son and niece. We reviewed her pillboxes today in detail and she will be on amlodipine 10 mg a day, losartan 100 mg a day, and Toprol-XL 25 mg/day.  Hopefully she can check a few blood pressures between now and next visit in 1 week.  #4 acute on chronic cognitive impairment. Seems a little bit better.  I think a lot of this can be attributed  to her inadvertent self discontinuation of her Zoloft, a situation which she has brought on similar symptoms for her in the past.  Restart Zoloft 50 mg nightly.  An After Visit Summary was printed and given to the patient.  FOLLOW UP: Return in about 5 days (around 08/19/2022) for f/u bp, anx, eye.  Signed:  Crissie Sickles, MD           08/19/2022

## 2022-08-19 ENCOUNTER — Encounter: Payer: Self-pay | Admitting: Family Medicine

## 2022-08-19 ENCOUNTER — Ambulatory Visit (INDEPENDENT_AMBULATORY_CARE_PROVIDER_SITE_OTHER): Payer: Medicare Other | Admitting: Family Medicine

## 2022-08-19 VITALS — BP 146/75 | HR 84 | Temp 98.1°F | Wt 133.2 lb

## 2022-08-19 DIAGNOSIS — G3184 Mild cognitive impairment, so stated: Secondary | ICD-10-CM

## 2022-08-19 DIAGNOSIS — I10 Essential (primary) hypertension: Secondary | ICD-10-CM | POA: Diagnosis not present

## 2022-08-19 DIAGNOSIS — L03213 Periorbital cellulitis: Secondary | ICD-10-CM | POA: Diagnosis not present

## 2022-08-19 NOTE — Progress Notes (Signed)
OFFICE VISIT  08/19/2022  CC:  Chief Complaint  Patient presents with   Memory Loss    Patient is a 88 y.o. female who presents accompanied by her son for 5 d f/u left eye cellulitis, UTI, acute on chronic cognitive impairment, and uncontrolled HTN. A/P as of last visit: "#1 UTI.  Suspect contributing to most recent cognitive impairment.  She is almost finished with antibiotics for this (cefdinir).   #2 left periorbital cellulitis, large scab. I cut just a tiny bit of this off today.  Continue soaks and expect gradual resolution.  No sign of acute infection in the area at this time.   3.  Uncontrolled hypertension.  She has had some difficulty with medications, remembering to take them, take them correctly etc. She is getting this straightened out at home with the help of her son and niece. We reviewed her pillboxes today in detail and she will be on amlodipine 10 mg a day, losartan 100 mg a day, and Toprol-XL 25 mg/day.  Hopefully she can check a few blood pressures between now and next visit in 1 week.   #4 acute on chronic cognitive impairment. Seems a little bit better.  I think a lot of this can be attributed to her inadvertent self discontinuation of her Zoloft, a situation which she has brought on similar symptoms for her in the past.  Restart Zoloft 50 mg nightly."  INTERIM HX: Gradually feeling better. She is eating well, taking all meds appropriately. It turns out her son says she has actually been taking her Zoloft every day as instructed after all. She went out to eat with her family and did some Christmas shopping recently.  No fevers, no face or eye pain. No blood pressure checks at home.  Past Medical History:  Diagnosis Date   Aortic insufficiency 09/2016   mild/mod aortic insufficiency, and mild mitral insufficiency.--Repeat echo 1 yr.   Baker's cyst of knee 02/25/2012   LE Venous Doppler May 30, '13 - non-vascular swelling/mass left popliteal fossa c/w Baker's  cyst    CARCINOMA, BREAST, LEFT 1993   Lumpectomy, adjuvant XRT, then tamoxifen.  Gets annual mammograms, no hx of abnormals--as of 2016, patient has chosen to stop getting screening mammograms.   Chronic renal insufficiency, stage III (moderate) (HCC)    DEPRESSION, SITUATIONAL 03/26/2009   Qualifier: Diagnosis of  By: Linda Hedges MD, Heinz Knuckles    GLAUCOMA 06/07/2007   Qualifier: Diagnosis of  By: [place], [place]     HYPERLIPIDEMIA 06/07/2007   Qualifier: Diagnosis of  By: Danny Lawless CMA, [place]     HYPERTENSION 06/07/2007   Qualifier: Diagnosis of  By: Danny Lawless [place], [place]     HYSTERECTOMY, HX OF 06/07/2007   Qualifier: Diagnosis of  By: Danny Lawless CMA, [place]     Personal history of surgery to other organs 06/07/2007   Centricity Description: BLADDER REPAIR, HX OF Qualifier: Diagnosis of  By: Danny Lawless CMA, [place]   Centricity Description: LUMPECTOMY, BREAST, HX OF Qualifier: Diagnosis of  By: Danny Lawless CMA, [place]     POSTMENOPAUSAL OSTEOPOROSIS 11/18/2010   Qualifier: Diagnosis of  By: Linda Hedges MD, Heinz Knuckles    Prediabetes 12/2021   April 2023 a1c 6.1%    Past Surgical History:  Procedure Laterality Date   ABDOMINAL HYSTERECTOMY     Benign reasons per pt, but she can't recall exactly what dx.  Ovaries and tubes are still in.     [place]  lumpectomy w/adjuvant XRT   EYE SURGERY  2011   cataract surgery [facility]    TRANSTHORACIC ECHOCARDIOGRAM  10/09/2016   EF 60-65%, normal wall motion, grd I DD, mild/mod aortic regurg, mild mitral regurg.    Outpatient Medications Prior to Visit  Medication Sig Dispense Refill   amLODipine (NORVASC) 10 MG tablet TAKE 1 TABLET BY MOUTH EVERY DAY 90 tablet 0   brinzolamide (AZOPT) 1 % ophthalmic suspension 1 drop 2 (two) times daily.     cefdinir (OMNICEF) 300 MG capsule Take 1 capsule (300 mg total) by mouth 2 (two) times daily. 12 capsule 0   latanoprost (XALATAN) 0.005 % ophthalmic solution Place 1 drop into both eyes at  bedtime.     losartan (COZAAR) 50 MG tablet Take 2 tablets (100 mg total) by mouth daily. OFFICE VISIT NEEDED FOR FURTHER REFILLS 180 tablet 1   metoprolol succinate (TOPROL-XL) 25 MG 24 hr tablet Take 1 tablet (25 mg total) by mouth daily. 90 tablet 1   Multiple Vitamins-Minerals (ICAPS AREDS FORMULA PO) Take 1 capsule by mouth 2 (two) times daily.      ROCKLATAN 0.02-0.005 % SOLN Apply 1 drop to eye at bedtime.     sertraline (ZOLOFT) 50 MG tablet Take 1 tablet (50 mg total) by mouth daily. 90 tablet 1   timolol (TIMOPTIC) 0.5 % ophthalmic solution      No facility-administered medications prior to visit.    Allergies  Allergen Reactions   Atorvastatin Other (See Comments)    Leg myalgias   Prednisone Other (See Comments)    Various cognitive/psychiatric side effects    ROS As per HPI  PE:    08/19/2022    1:39 PM 08/14/2022    3:30 PM 08/12/2022    4:12 PM  Vitals with BMI  Height  '5\' 0"'$    Weight 133 lbs 3 oz 135 lbs 6 oz   BMI 85.46 27.03   Systolic 500 938 182  Diastolic 75 75 80  Pulse 84 74      Physical Exam  General: Alert, oriented x4. Lucid thought and speech. Left temple with approximately 2 cm moist superficial ulceration with granulation tissue present.  No surrounding erythema.  The scab adjacent to this came off today with gentle tugging.  No bleeding.  No fluctuance. No swelling of the eye at all.  LABS:  Last metabolic panel Lab Results  Component Value Date   GLUCOSE 135 (H) 08/10/2022   NA 140 08/10/2022   K 3.7 08/10/2022   CL 104 08/10/2022   CO2 27 08/10/2022   BUN 20 08/10/2022   CREATININE 0.88 08/10/2022   GFRNONAA >60 08/17/2021   CALCIUM 9.6 08/10/2022   PROT 6.8 08/17/2021   ALBUMIN 3.9 08/17/2021   BILITOT 0.6 08/17/2021   ALKPHOS 98 08/17/2021   AST 14 (L) 08/17/2021   ALT 7 08/17/2021   ANIONGAP 11 08/17/2021   IMPRESSION AND PLAN:  #1 hypertension, blood pressure better here today.  She is back on all of her blood  pressure medicine. She will try to find her home blood pressure cuff.  If she cannot find it she will get a new one over-the-counter and try to monitor this regularly.  #2 mild cognitive impairment. She has cleared up some and seems to be at baseline. Her son is helping her with compliance with medications. Encouraged her to try to write something down in her journal once a day and we will review this when she comes back.  #  3 left eye periorbital cellulitis. Resolved.  Apply Neosporin 1-2 times a day to the superficial ulceration still remaining.  #4 UTI, resolved.  She finishes antibiotics today.  An After Visit Summary was printed and given to the patient.  FOLLOW UP: No follow-ups on file.  Signed:  Crissie Sickles, MD           08/19/2022

## 2022-09-23 ENCOUNTER — Ambulatory Visit: Payer: Medicare Other | Admitting: Family Medicine

## 2022-10-04 ENCOUNTER — Other Ambulatory Visit: Payer: Self-pay | Admitting: Family Medicine

## 2022-10-19 ENCOUNTER — Other Ambulatory Visit: Payer: Self-pay | Admitting: Family Medicine

## 2022-11-20 ENCOUNTER — Encounter: Payer: Self-pay | Admitting: Family Medicine

## 2022-11-20 ENCOUNTER — Ambulatory Visit (INDEPENDENT_AMBULATORY_CARE_PROVIDER_SITE_OTHER): Payer: Medicare Other | Admitting: Family Medicine

## 2022-11-20 VITALS — BP 123/71 | HR 78 | Temp 98.0°F | Ht 60.0 in | Wt 128.2 lb

## 2022-11-20 DIAGNOSIS — R350 Frequency of micturition: Secondary | ICD-10-CM

## 2022-11-20 DIAGNOSIS — F411 Generalized anxiety disorder: Secondary | ICD-10-CM | POA: Diagnosis not present

## 2022-11-20 DIAGNOSIS — I10 Essential (primary) hypertension: Secondary | ICD-10-CM

## 2022-11-20 DIAGNOSIS — Z87440 Personal history of urinary (tract) infections: Secondary | ICD-10-CM | POA: Diagnosis not present

## 2022-11-20 LAB — POCT URINALYSIS DIPSTICK
Bilirubin, UA: NEGATIVE
Blood, UA: NEGATIVE
Glucose, UA: NEGATIVE
Leukocytes, UA: NEGATIVE
Nitrite, UA: NEGATIVE
Protein, UA: POSITIVE — AB
Spec Grav, UA: >=1.03 — AB (ref 1.010–1.025)
Urobilinogen, UA: 2 E.U./dL — AB
pH, UA: 6.5 (ref 5.0–8.0)

## 2022-11-20 MED ORDER — AMLODIPINE BESYLATE 10 MG PO TABS: 10.0000 mg | ORAL_TABLET | Freq: Every day | ORAL | 1 refills | Status: DC

## 2022-11-20 MED ORDER — METOPROLOL SUCCINATE ER 25 MG PO TB24: 25.0000 mg | ORAL_TABLET | Freq: Every day | ORAL | 1 refills | Status: DC

## 2022-11-20 MED ORDER — LOSARTAN POTASSIUM 50 MG PO TABS: 100.0000 mg | ORAL_TABLET | Freq: Every day | ORAL | 1 refills | Status: DC

## 2022-11-20 NOTE — Progress Notes (Signed)
OFFICE VISIT  11/20/2022  CC:  Chief Complaint  Patient presents with   Medical Management of Chronic Issues    Patient is a 89 y.o. female who presents accompanied by her son for 68-monthfollow-up hypertension, chronic anxiety, and mild cognitive impairment with memory loss. A/P as of last visit: "#1 hypertension, blood pressure better here today.  She is back on all of her blood pressure medicine. She will try to find her home blood pressure cuff.  If she cannot find it she will get a new one over-the-counter and try to monitor this regularly.   #2 mild cognitive impairment. She has cleared up some and seems to be at baseline. Her son is helping her with compliance with medications. Encouraged her to try to write something down in her journal once a day and we will review this when she comes back.   #3 left eye periorbital cellulitis. Resolved.  Apply Neosporin 1-2 times a day to the superficial ulceration still remaining.   #4 UTI, resolved.  She finishes antibiotics today."  INTERIM HX: Doing fairly well. Still living at home alone and she is doing well with taking her medications regularly. Family checks in on her regularly. Few days ago she started acting a little more tired than her usual, laying in her bed more than usual.  She denies any dysuria but her son says 1 day recently she had some urinary frequency but this seems to have resolved over the last day or 2. No nausea, vomiting, abdominal pain, or diarrhea.  No fever.  No significant behavioral outbursts or disorientation.  She does not monitor her blood pressure at home.  The level still chronically pretty high but stable. No depression.  She admits she does not drink water and large enough quantities.  ROS as above, plus-->  no CP, no SOB, no wheezing, no cough, no dizziness, no HAs, no rashes, no melena/hematochezia.  No polyuria or polydipsia.  No myalgias or arthralgias.  No focal weakness, paresthesias, or  tremors.  No acute vision or hearing abnormalities.   No recent changes in lower legs.  No palpitations.     Past Medical History:  Diagnosis Date   Aortic insufficiency 09/2016   mild/mod aortic insufficiency, and mild mitral insufficiency.--Repeat echo 1 yr.   Baker's cyst of knee 02/25/2012   LE Venous Doppler May 30, '13 - non-vascular swelling/mass left popliteal fossa c/w Baker's cyst    CARCINOMA, BREAST, LEFT 1993   Lumpectomy, adjuvant XRT, then tamoxifen.  Gets annual mammograms, no hx of abnormals--as of 2016, patient has chosen to stop getting screening mammograms.   Chronic renal insufficiency, stage III (moderate) (HCC)    DEPRESSION, SITUATIONAL 03/26/2009   Qualifier: Diagnosis of  By: NLinda HedgesMD, MHeinz Knuckles   GLAUCOMA 06/07/2007   Qualifier: Diagnosis of  By: SAugusta KBurundi    HYPERLIPIDEMIA 06/07/2007   Qualifier: Diagnosis of  By: SDanny LawlessCMA, KBurundi    HYPERTENSION 06/07/2007   Qualifier: Diagnosis of  By: SDanny LawlessCMA, KBurundi    HYSTERECTOMY, HX OF 06/07/2007   Qualifier: Diagnosis of  By: SDanny LawlessCMA, KBur[place]i    Personal history of surgery to other organs 06/07/2007   Centricity Description: BLADDER REPAIR, HX OF Qualifier: Diagnosis of  By: SDanny LawlessCMA, KBur[place]  Centricity Description: LUMPECTOMY, BREAST, HX OF Qualifier: Diagnosis of  By: SDanny LawlessCNobles KBurundi    POSTMENOPAUSAL OSTEOPOROSIS 11/18/2010   Qualifier: Diagnosis of  By: NLinda HedgesMD, MHeinz Knuckles  Prediabetes 12/2021   April 2023 a1c 6.1%    Past Surgical History:  Procedure Laterality Date   ABDOMINAL HYSTERECTOMY     Benign reasons per pt, but she can't recall exactly what dx.  Ovaries and tubes are still in.     BLADDER REPAIR     BREAST SURGERY  1993   lumpectomy w/adjuvant XRT   EYE SURGERY  2011   cataract surgery [facility]    TRANSTHORACIC ECHOCARDIOGRAM  10/09/2016   EF 60-65%, normal wall motion, grd I DD, mild/mod aortic regurg, mild mitral regurg.    Outpatient Medications Prior  to Visit  Medication Sig Dispense Refill   brinzolamide (AZOPT) 1 % ophthalmic suspension 1 drop 2 (two) times daily.     latanoprost (XALATAN) 0.005 % ophthalmic solution Place 1 drop into both eyes at bedtime.     Multiple Vitamins-Minerals (ICAPS AREDS FORMULA PO) Take 1 capsule by mouth 2 (two) times daily.      ROCKLATAN 0.02-0.005 % SOLN Apply 1 drop to eye at bedtime.     sertraline (ZOLOFT) 50 MG tablet Take 1 tablet (50 mg total) by mouth daily. 90 tablet 1   timolol (TIMOPTIC) 0.5 % ophthalmic solution      amLODipine (NORVASC) 10 MG tablet TAKE 1 TABLET BY MOUTH EVERY DAY 30 tablet 0   cefdinir (OMNICEF) 300 MG capsule Take 1 capsule (300 mg total) by mouth 2 (two) times daily. 12 capsule 0   losartan (COZAAR) 50 MG tablet Take 2 tablets (100 mg total) by mouth daily. OFFICE VISIT NEEDED FOR FURTHER REFILLS 180 tablet 1   metoprolol succinate (TOPROL-XL) 25 MG 24 hr tablet Take 1 tablet (25 mg total) by mouth daily. 90 tablet 1   No facility-administered medications prior to visit.    Allergies  Allergen Reactions   Atorvastatin Other (See Comments)    Leg myalgias   Prednisone Other (See Comments)    Various cognitive/psychiatric side effects   Review of Systems As per HPI  PE:    11/20/2022    1:57 PM 08/19/2022    1:39 PM 08/14/2022    3:30 PM  Vitals with BMI  Height '5\' 0"'$   '5\' 0"'$   Weight 128 lbs 3 oz 133 lbs 3 oz 135 lbs 6 oz  BMI 25.04 [id number]  Systolic [id number]  Diastolic 71 75 75  Pulse 78 84 74     Physical Exam  Gen: Alert, well appearing.  Patient is oriented to person, place, time, and situation. AFFECT: pleasant, lucid thought and speech. CV: RRR, no m/r/g.   LUNGS: CTA bilat, nonlabored resps, good aeration in all lung fields.  LABS:  Last CBC Lab Results  Component Value Date   WBC 9.0 08/10/2022   HGB 12.4 08/10/2022   HCT 38.2 08/10/2022   MCV 95.8 08/10/2022   MCH 31.1 08/17/2021   RDW 13.3 08/10/2022   PLT 242.0 [id number]    Last metabolic panel Lab Results  Component Value Date   GLUCOSE 135 (H) 08/10/2022   NA 140 08/10/2022   K 3.7 08/10/2022   CL 104 08/10/2022   CO2 27 08/10/2022   BUN 20 08/10/2022   CREATININE 0.88 08/10/2022   GFRNONAA >60 08/17/2021   CALCIUM 9.6 08/10/2022   PROT 6.8 08/17/2021   ALBUMIN 3.9 08/17/2021   BILITOT 0.6 08/17/2021   ALKPHOS 98 08/17/2021   AST 14 (L) 08/17/2021   ALT 7 08/17/2021   ANIONGAP 11 08/17/2021   Last thyroid functions  Lab Results  Component Value Date   TSH 3.17 09/29/2016    IMPRESSION AND PLAN:  #1 hypertension, well-controlled on amlodipine 10 mg a day, losartan 100 mg a day, and Toprol-XL 25 mg a day. Basic metabolic panel today.  2.  GAD, stable on Zoloft 50 mg a day.  3.  Fatigue, recent urinary frequency, history of UTI. UA today: Specific gravity greater than 1.030, trace ketones, protein 100 mg/dL.  Otherwise normal. Reiterated the importance of increased clear fluid intake.  An After Visit Summary was printed and given to the patient.  FOLLOW UP: Return in about 3 months (around 02/18/2023) for routine chronic illness f/u.  Signed:  Crissie Sickles, MD           11/20/2022

## 2022-11-21 LAB — BASIC METABOLIC PANEL
BUN: 14 mg/dL (ref 7–25)
CO2: 22 mmol/L (ref 20–32)
Calcium: 8.9 mg/dL (ref 8.6–10.4)
Chloride: 106 mmol/L (ref 98–110)
Creat: 0.89 mg/dL (ref 0.60–0.95)
Glucose, Bld: 131 mg/dL — ABNORMAL HIGH (ref 65–99)
Potassium: 3.9 mmol/L (ref 3.5–5.3)
Sodium: 141 mmol/L (ref 135–146)

## 2023-02-18 ENCOUNTER — Ambulatory Visit: Payer: Medicare Other | Admitting: Family Medicine

## 2023-02-26 ENCOUNTER — Encounter: Payer: Self-pay | Admitting: Family Medicine

## 2023-02-26 ENCOUNTER — Ambulatory Visit (INDEPENDENT_AMBULATORY_CARE_PROVIDER_SITE_OTHER): Payer: Medicare Other | Admitting: Family Medicine

## 2023-02-26 VITALS — BP 177/71 | HR 67 | Wt 137.4 lb

## 2023-02-26 DIAGNOSIS — I10 Essential (primary) hypertension: Secondary | ICD-10-CM

## 2023-02-26 DIAGNOSIS — R7303 Prediabetes: Secondary | ICD-10-CM | POA: Diagnosis not present

## 2023-02-26 DIAGNOSIS — F411 Generalized anxiety disorder: Secondary | ICD-10-CM | POA: Diagnosis not present

## 2023-02-26 LAB — BASIC METABOLIC PANEL
BUN: 18 mg/dL (ref 6–23)
CO2: 29 mEq/L (ref 19–32)
Calcium: 9.2 mg/dL (ref 8.4–10.5)
Chloride: 104 mEq/L (ref 96–112)
Creatinine, Ser: 0.84 mg/dL (ref 0.40–1.20)
GFR: 59.37 mL/min — ABNORMAL LOW (ref >60.00)
Glucose, Bld: 112 mg/dL — ABNORMAL HIGH (ref 70–99)
Potassium: 4.2 mEq/L (ref 3.5–5.1)
Sodium: 140 mEq/L (ref 135–145)

## 2023-02-26 LAB — HEMOGLOBIN A1C: Hgb A1c MFr Bld: 5.8 % (ref 4.6–6.5)

## 2023-02-26 MED ORDER — SERTRALINE HCL 50 MG PO TABS: 50.0000 mg | ORAL_TABLET | Freq: Every day | ORAL | 1 refills | Status: DC

## 2023-02-26 NOTE — Progress Notes (Signed)
OFFICE VISIT  02/26/2023  CC:  Chief Complaint  Patient presents with   Medical Management of Chronic Issues    3 mo f/u.    Patient is a 89 y.o. female who presents unaccompanied today for 51-month follow-up hypertension, prediabetes, chronic anxiety, and mild cognitive impairment with memory loss. A/P as of last visit: "#1 hypertension, well-controlled on amlodipine 10 mg a day, losartan 100 mg a day, and Toprol-XL 25 mg a day. Basic metabolic panel today.   2.  GAD, stable on Zoloft 50 mg a day.   3.  Fatigue, recent urinary frequency, history of UTI. UA today: Specific gravity greater than 1.030, trace ketones, protein 100 mg/dL.  Otherwise normal. Reiterated the importance of increased clear fluid intake."  INTERIM HX: Irma states that she does well. She lives with her son and he helps her be compliant with her medication. She gets out to church every week.  No recent falls.  She does not have any headaches or dizziness. She does not monitor blood pressure at home. Anxiety level stable.  Mood is good.  She is eating well and sleeping well.  Past Medical History:  Diagnosis Date   Aortic insufficiency 09/2016   mild/mod aortic insufficiency, and mild mitral insufficiency.--Repeat echo 1 yr.   Baker's cyst of knee 02/25/2012   LE Venous Doppler May 30, '13 - non-vascular swelling/mass left popliteal fossa c/w Baker's cyst    CARCINOMA, BREAST, LEFT 1993   Lumpectomy, adjuvant XRT, then tamoxifen.  Gets annual mammograms, no hx of abnormals--as of 2016, patient has chosen to stop getting screening mammograms.   Chronic renal insufficiency, stage III (moderate) (HCC)    DEPRESSION, SITUATIONAL 03/26/2009   Qualifier: Diagnosis of  By: Debby Bud MD, Rosalyn Gess    GLAUCOMA 06/07/2007   Qualifier: Diagnosis of  By: Genelle Gather CMA, [place]     HYPERLIPIDEMIA 06/07/2007   Qualifier: Diagnosis of  By: Genelle Gather CMA, [place]     HYPERTENSION 06/07/2007   Qualifier: Diagnosis of  By: Genelle Gather  CMA, [place]     HYSTERECTOMY, HX OF 06/07/2007   Qualifier: Diagnosis of  By: Genelle Gather CMA, [place]     Personal history of surgery to other organs 06/07/2007   Centricity Description: BLADDER REPAIR, HX OF Qualifier: Diagnosis of  By: Genelle Gather CMA, [place]   Centricity Description: LUMPECTOMY, BREAST, HX OF Qualifier: Diagnosis of  By: Genelle Gather CMA, [place]     POSTMENOPAUSAL OSTEOPOROSIS 11/18/2010   Qualifier: Diagnosis of  By: Debby Bud MD, Rosalyn Gess    Prediabetes 12/2021   April 2023 a1c 6.1%    Past Surgical History:  Procedure Laterality Date   ABDOMINAL HYSTERECTOMY     Benign reasons per pt, but she can't recall exactly what dx.  Ovaries and tubes are still in.     BLADDER REPAIR     BREAST SURGERY  1993   lumpectomy w/adjuvant XRT   EYE SURGERY  2011   cataract surgery [facility]    TRANSTHORACIC ECHOCARDIOGRAM  10/09/2016   EF 60-65%, normal wall motion, grd I DD, mild/mod aortic regurg, mild mitral regurg.    Outpatient Medications Prior to Visit  Medication Sig Dispense Refill   amLODipine (NORVASC) 10 MG tablet Take 1 tablet (10 mg total) by mouth daily. 90 tablet 1   brinzolamide (AZOPT) 1 % ophthalmic suspension 1 drop 2 (two) times daily.     latanoprost (XALATAN) 0.005 % ophthalmic solution Place 1 drop into both eyes at bedtime.     losartan (COZAAR) 50 MG  tablet Take 2 tablets (100 mg total) by mouth daily. 180 tablet 1   metoprolol succinate (TOPROL-XL) 25 MG 24 hr tablet Take 1 tablet (25 mg total) by mouth daily. 90 tablet 1   Multiple Vitamins-Minerals (ICAPS AREDS FORMULA PO) Take 1 capsule by mouth 2 (two) times daily.      ROCKLATAN 0.02-0.005 % SOLN Apply 1 drop to eye at bedtime.     timolol (TIMOPTIC) 0.5 % ophthalmic solution      sertraline (ZOLOFT) 50 MG tablet Take 1 tablet (50 mg total) by mouth daily. 90 tablet 1   No facility-administered medications prior to visit.    Allergies  Allergen Reactions   Atorvastatin Other (See Comments)    Leg myalgias    Prednisone Other (See Comments)    Various cognitive/psychiatric side effects    Review of Systems As per HPI  PE:    02/26/2023    1:49 PM 11/20/2022    1:57 PM 08/19/2022    1:39 PM  Vitals with BMI  Height  5\' 0"    Weight 137 lbs 6 oz 128 lbs 3 oz 133 lbs 3 oz  BMI  25.04 26.01  Systolic 177 123 604  Diastolic 71 71 75  Pulse 67 78 84  Initial bp today was 177/71. Rpt manual was 140/74  Physical Exam  Gen: Alert, well appearing.  Patient is oriented to person, place, time, and situation. AFFECT: pleasant, lucid thought and speech. CV: RRR, soft systolic murmur, no r/g.   LUNGS: CTA bilat, nonlabored resps, good aeration in all lung fields. EXT: no cyanosis or pitting edema  LABS:  Last CBC Lab Results  Component Value Date   WBC 9.0 08/10/2022   HGB 12.4 08/10/2022   HCT 38.2 08/10/2022   MCV 95.8 08/10/2022   MCH 31.1 08/17/2021   RDW 13.3 08/10/2022   PLT 242.0 08/10/2022   Last metabolic panel Lab Results  Component Value Date   GLUCOSE 131 (H) 11/20/2022   NA 141 11/20/2022   K 3.9 11/20/2022   CL 106 11/20/2022   CO2 22 11/20/2022   BUN 14 11/20/2022   CREATININE 0.89 11/20/2022   GFRNONAA >60 08/17/2021   CALCIUM 8.9 11/20/2022   PROT 6.8 08/17/2021   ALBUMIN 3.9 08/17/2021   BILITOT 0.6 08/17/2021   ALKPHOS 98 08/17/2021   AST 14 (L) 08/17/2021   ALT 7 08/17/2021   ANIONGAP 11 08/17/2021   Last lipids Lab Results  Component Value Date   CHOL 176 01/17/2014   HDL 57.60 01/17/2014   LDLCALC 91 01/17/2014   LDLDIRECT 124.7 11/14/2010   TRIG 139.0 01/17/2014   CHOLHDL 3 01/17/2014   Last hemoglobin A1c Lab Results  Component Value Date   HGBA1C 6.1 12/31/2021   Last thyroid functions Lab Results  Component Value Date   TSH 3.17 09/29/2016   IMPRESSION AND PLAN:  #1 hypertension. Her blood pressure is usually significantly elevated in the office compared to home. We will proceed with the approach of permissive mild  hypertension at this time. Continue amlodipine 10 mg a day, losartan 100 mg a day, and Toprol XL 25 mg a day. Electrolytes and creatinine today.  2.  GAD with mild cognitive impairment/memory loss. Stable. She seems to be doing well lately. Continue sertraline 50 mg daily.  #3 prediabetes. Hemoglobin A1c was 6.1% 1 year ago. Repeat hemoglobin A1c today.  An After Visit Summary was printed and given to the patient.  FOLLOW UP: Return in about 3 months (  around 05/29/2023) for routine chronic illness f/u.  Signed:  Santiago Bumpers, MD           02/26/2023

## 2023-05-10 ENCOUNTER — Telehealth: Payer: Self-pay

## 2023-05-10 NOTE — Patient Outreach (Signed)
  Care Coordination   05/10/2023 Name: Amy Turner MRN: [id number] DOB: 05-11-1926   Care Coordination Outreach Attempts:  An unsuccessful telephone outreach was attempted today to offer the patient information about available care coordination services.  Follow Up Plan:  Additional outreach attempts will be made to offer the patient care coordination information and services.   Encounter Outcome:  No Answer   Care Coordination Interventions:  No, not indicated    Bevelyn Ngo, BSW, CDP Social Worker, Certified Dementia Practitioner [facility] Care Management  Care Coordination [phone]

## 2023-05-13 ENCOUNTER — Ambulatory Visit: Payer: Self-pay

## 2023-05-13 NOTE — Patient Outreach (Signed)
  Care Coordination   Initial Visit Note   05/13/2023 Name: Amy Turner MRN: [id number] DOB: [birth year]  Amy Turner is a 89 y.o. year old female who sees McGowen, Maryjean Morn, MD for primary care. I spoke with  Amy Turner by phone today.  What matters to the patients health and wellness today?  No concerns, patient is doing well at this time    Goals Addressed             This Visit's Progress    COMPLETED: Care Coordination Activities       Care Coordination Interventions: SDoH screening performed - no acute challenges identified at this time Determined the patient does not have concerns with medication costs and/or adherence Education provided on the role of the care coordination team - no follow up desired at this time Encouraged the patient to contact her primary care provider as needed         SDOH assessments and interventions completed:  Yes  SDOH Interventions Today    Flowsheet Row Most Recent Value  SDOH Interventions   Food Insecurity Interventions Intervention Not Indicated  Housing Interventions Intervention Not Indicated  Transportation Interventions Intervention Not Indicated  Utilities Interventions Intervention Not Indicated        Care Coordination Interventions:  Yes, provided   Interventions Today    Flowsheet Row Most Recent Value  Chronic Disease   Chronic disease during today's visit Hypertension (HTN)  General Interventions   General Interventions Discussed/Reviewed General Interventions Discussed, Doctor Visits  Doctor Visits Discussed/Reviewed Doctor Visits Reviewed  Education Interventions   Education Provided Provided Education  Provided Verbal Education On --  [role of the care coordination team]        Follow up plan: No further intervention required.   Encounter Outcome:  Pt. Visit Completed    Bevelyn Ngo, BSW, CDP Social Worker, Certified Dementia Practitioner [facility] Care Management  Care  Coordination [phone]

## 2023-05-13 NOTE — Patient Instructions (Signed)
Visit Information  Thank you for taking time to visit with me today. Please don't hesitate to contact me if I can be of assistance to you.   Following are the goals we discussed today:  - Contact your primary care provider as needed  If you are experiencing a Mental Health or Behavioral Health Crisis or need someone to talk to, please call 1-800-273-TALK (toll free, 24 hour hotline) go to [facility] [address] [place] [phone]) call 911  The patient verbalized understanding of instructions, educational materials, and care plan provided today and DECLINED offer to receive copy of patient instructions, educational materials, and care plan.   No further follow up required: Please contact me as needed  Bevelyn Ngo, BSW, CDP Social Worker, Certified Dementia Practitioner [facility] Care Management  Care Coordination [phone]

## 2023-06-01 ENCOUNTER — Other Ambulatory Visit: Payer: Self-pay

## 2023-06-01 ENCOUNTER — Other Ambulatory Visit: Payer: Self-pay | Admitting: Family Medicine

## 2023-06-01 MED ORDER — SERTRALINE HCL 50 MG PO TABS: 50.0000 mg | ORAL_TABLET | Freq: Every day | ORAL | 0 refills | Status: DC

## 2023-06-01 MED ORDER — AMLODIPINE BESYLATE 10 MG PO TABS: 10.0000 mg | ORAL_TABLET | Freq: Every day | ORAL | 0 refills | Status: DC

## 2023-06-01 MED ORDER — METOPROLOL SUCCINATE ER 25 MG PO TB24: 25.0000 mg | ORAL_TABLET | Freq: Every day | ORAL | 0 refills | Status: DC

## 2023-06-01 MED ORDER — LOSARTAN POTASSIUM 50 MG PO TABS: 100.0000 mg | ORAL_TABLET | Freq: Every day | ORAL | 0 refills | Status: DC

## 2023-06-02 ENCOUNTER — Encounter: Payer: Self-pay | Admitting: Family Medicine

## 2023-06-02 ENCOUNTER — Ambulatory Visit (INDEPENDENT_AMBULATORY_CARE_PROVIDER_SITE_OTHER): Payer: Medicare Other | Admitting: Family Medicine

## 2023-06-02 VITALS — BP 146/64 | HR 61 | Ht 60.0 in | Wt 145.8 lb

## 2023-06-02 DIAGNOSIS — I10 Essential (primary) hypertension: Secondary | ICD-10-CM | POA: Diagnosis not present

## 2023-06-02 DIAGNOSIS — G3184 Mild cognitive impairment, so stated: Secondary | ICD-10-CM | POA: Diagnosis not present

## 2023-06-02 DIAGNOSIS — F411 Generalized anxiety disorder: Secondary | ICD-10-CM | POA: Diagnosis not present

## 2023-06-02 NOTE — Progress Notes (Signed)
OFFICE VISIT  06/02/2023  CC:  Chief Complaint  Patient presents with   Medical Management of Chronic Issues    Patient is a 89 y.o. female who presents for 17-month follow-up hypertension, chronic anxiety, and mild cognitive impairment with memory loss. A/P as of last visit: "#1 hypertension. Her blood pressure is usually significantly elevated in the office compared to home. We will proceed with the approach of permissive mild hypertension at this time. Continue amlodipine 10 mg a day, losartan 100 mg a day, and Toprol XL 25 mg a day. Electrolytes and creatinine today.   2.  GAD with mild cognitive impairment/memory loss. Stable. She seems to be doing well lately. Continue sertraline 50 mg daily.   #3 prediabetes. Hemoglobin A1c was 6.1% 1 year ago. Repeat hemoglobin A1c today."  INTERIM HX: Amy Turner feels well, no acute concerns. She remains independent in her home, does not need a cane or walker to ambulate. She does not drive.  ROS as above, plus--> no fevers, no CP, no SOB, no wheezing, no cough, no dizziness, no HAs, no rashes, no melena/hematochezia.  No polyuria or polydipsia.  No myalgias or arthralgias.  No focal weakness, paresthesias, or tremors.  No acute vision or hearing abnormalities.  No dysuria or unusual/new urinary urgency or frequency.  No recent changes in lower legs. No n/v/d or abd pain.  No palpitations.    Past Medical History:  Diagnosis Date   Aortic insufficiency 09/2016   mild/mod aortic insufficiency, and mild mitral insufficiency.--Repeat echo 1 yr.   Baker's cyst of knee 02/25/2012   LE Venous Doppler May 30, '13 - non-vascular swelling/mass left popliteal fossa c/w Baker's cyst    CARCINOMA, BREAST, LEFT 1993   Lumpectomy, adjuvant XRT, then tamoxifen.  Gets annual mammograms, no hx of abnormals--as of 2016, patient has chosen to stop getting screening mammograms.   Chronic renal insufficiency, stage III (moderate) (HCC)    DEPRESSION,  SITUATIONAL 03/26/2009   Qualifier: Diagnosis of  By: Debby Bud MD, Rosalyn Gess    GLAUCOMA 06/07/2007   Qualifier: Diagnosis of  By: Genelle Gather CMA, [place]     HYPERLIPIDEMIA 06/07/2007   Qualifier: Diagnosis of  By: Genelle Gather CMA, [place]     HYPERTENSION 06/07/2007   Qualifier: Diagnosis of  By: Genelle Gather CMA, [place]     HYSTERECTOMY, HX OF 06/07/2007   Qualifier: Diagnosis of  By: Genelle Gather CMA, [place]     Personal history of surgery to other organs 06/07/2007   Centricity Description: BLADDER REPAIR, HX OF Qualifier: Diagnosis of  By: Genelle Gather CMA, [place]   Centricity Description: LUMPECTOMY, BREAST, HX OF Qualifier: Diagnosis of  By: Genelle Gather CMA, [place]     POSTMENOPAUSAL OSTEOPOROSIS 11/18/2010   Qualifier: Diagnosis of  By: Debby Bud MD, Rosalyn Gess    Prediabetes 12/2021   April 2023 a1c 6.1%    Past Surgical History:  Procedure Laterality Date   ABDOMINAL HYSTERECTOMY     Benign reasons per pt, but she can't recall exactly what dx.  Ovaries and tubes are still in.     BLADDER REPAIR     BREAST SURGERY  1993   lumpectomy w/adjuvant XRT   EYE SURGERY  2011   cataract surgery [facility]    TRANSTHORACIC ECHOCARDIOGRAM  10/09/2016   EF 60-65%, normal wall motion, grd I DD, mild/mod aortic regurg, mild mitral regurg.    Outpatient Medications Prior to Visit  Medication Sig Dispense Refill   amLODipine (NORVASC) 10 MG tablet Take 1 tablet (10 mg total) by mouth  daily. 30 tablet 0   brinzolamide (AZOPT) 1 % ophthalmic suspension 1 drop 2 (two) times daily.     latanoprost (XALATAN) 0.005 % ophthalmic solution Place 1 drop into both eyes at bedtime.     losartan (COZAAR) 50 MG tablet Take 2 tablets (100 mg total) by mouth daily. 30 tablet 0   metoprolol succinate (TOPROL-XL) 25 MG 24 hr tablet Take 1 tablet (25 mg total) by mouth daily. 30 tablet 0   Multiple Vitamins-Minerals (ICAPS AREDS FORMULA PO) Take 1 capsule by mouth 2 (two) times daily.      ROCKLATAN 0.02-0.005 % SOLN Apply 1 drop to eye at  bedtime.     sertraline (ZOLOFT) 50 MG tablet Take 1 tablet (50 mg total) by mouth daily. 30 tablet 0   timolol (TIMOPTIC) 0.5 % ophthalmic solution      No facility-administered medications prior to visit.    Allergies  Allergen Reactions   Atorvastatin Other (See Comments)    Leg myalgias   Prednisone Other (See Comments)    Various cognitive/psychiatric side effects    Review of Systems As per HPI  PE:    06/02/2023    1:50 PM 06/02/2023    1:37 PM 02/26/2023    1:49 PM  Vitals with BMI  Height  5\' 0"    Weight  145 lbs 13 oz 137 lbs 6 oz  BMI  28.47   Systolic 146 174 960  Diastolic 64 67 71  Pulse  61 67     Physical Exam  Gen: Alert, well appearing.  Patient is oriented to person, place, time, and situation. AFFECT: pleasant, lucid thought and speech. CV: RRR, occ ectopic beat, 2/6 syst murmur, no diastolic murmur  EXT: no clubbing or cyanosis.  Trace L LL pitting edema and 1+ RLL pitting.    LABS:  Last CBC Lab Results  Component Value Date   WBC 9.0 08/10/2022   HGB 12.4 08/10/2022   HCT 38.2 08/10/2022   MCV 95.8 08/10/2022   MCH 31.1 08/17/2021   RDW 13.3 08/10/2022   PLT 242.0 08/10/2022   Last metabolic panel Lab Results  Component Value Date   GLUCOSE 112 (H) 02/26/2023   NA 140 02/26/2023   K 4.2 02/26/2023   CL 104 02/26/2023   CO2 29 02/26/2023   BUN 18 02/26/2023   CREATININE 0.84 02/26/2023   GFR 59.37 (L) 02/26/2023   CALCIUM 9.2 02/26/2023   PROT 6.8 08/17/2021   ALBUMIN 3.9 08/17/2021   BILITOT 0.6 08/17/2021   ALKPHOS 98 08/17/2021   AST 14 (L) 08/17/2021   ALT 7 08/17/2021   ANIONGAP 11 08/17/2021   Last hemoglobin A1c Lab Results  Component Value Date   HGBA1C 5.8 02/26/2023   IMPRESSION AND PLAN:  #1 hypertension, well-controlled on amlodipine 10 mg a day, losartan 100 mg a day, and Toprol-XL 25 mg a day. BMET today  #2 GAD with mild cognitive impairment/memory loss. Stable.  Continue sertraline 50 mg a day.  #3  prediabetes. Hemoglobin A1c was 6.1% 15 months ago and then down to 5.8% 3 mo ago. Repeat hemoglobin A1c 3-6 mo.  An After Visit Summary was printed and given to the patient.  FOLLOW UP: Return in about 3 months (around 09/01/2023) for routine chronic illness f/u.  Signed:  Santiago Bumpers, MD           06/02/2023

## 2023-06-02 NOTE — Patient Instructions (Signed)
Be sure to get your flu shot in about 1 month!

## 2023-06-03 LAB — BASIC METABOLIC PANEL
BUN: 19 mg/dL (ref 6–23)
CO2: 29 meq/L (ref 19–32)
Calcium: 9.6 mg/dL (ref 8.4–10.5)
Chloride: 106 meq/L (ref 96–112)
Creatinine, Ser: 0.85 mg/dL (ref 0.40–1.20)
GFR: 58.43 mL/min — ABNORMAL LOW (ref >60.00)
Glucose, Bld: 120 mg/dL — ABNORMAL HIGH (ref 70–99)
Potassium: 4.7 meq/L (ref 3.5–5.1)
Sodium: 142 meq/L (ref 135–145)

## 2023-06-04 ENCOUNTER — Other Ambulatory Visit: Payer: Self-pay | Admitting: Family Medicine

## 2023-06-07 ENCOUNTER — Telehealth: Payer: Self-pay | Admitting: Family Medicine

## 2023-06-07 ENCOUNTER — Other Ambulatory Visit: Payer: Self-pay

## 2023-06-07 DIAGNOSIS — I10 Essential (primary) hypertension: Secondary | ICD-10-CM

## 2023-06-07 MED ORDER — LOSARTAN POTASSIUM 50 MG PO TABS: 100.0000 mg | ORAL_TABLET | Freq: Every day | ORAL | 0 refills | Status: DC

## 2023-06-07 MED ORDER — SERTRALINE HCL 50 MG PO TABS: 50.0000 mg | ORAL_TABLET | Freq: Every day | ORAL | 0 refills | Status: DC

## 2023-06-07 NOTE — Telephone Encounter (Signed)
Refill sent.

## 2023-06-07 NOTE — Telephone Encounter (Signed)
Prescription Request  06/07/2023  LOV: 06/02/2023 Amy Turner would like to know if she will have refills until her appointment in Dec or will she have to continue to call. Please give the patient a call to let her know the details of future refills.   What is the name of the medication or equipment? losartan (COZAAR) 50 MG tablet , sertraline (ZOLOFT) 50 MG tablet   Have you contacted your pharmacy to request a refill? Yes   Which pharmacy would you like this sent to?  [id number] - [place], [place] - [address] [place] [address] Phone: [phone] Fax: [phone]    Patient notified that their request is being sent to the clinical staff for review and that they should receive a response within 2 business days.   Please advise at Mobile [phone] (mobile)  Amy Turner would like to know if she will have refills until her appointment in Dec or will she have to continue to call. Please give the patient a call to let her know the details of future refills.

## 2023-06-16 ENCOUNTER — Ambulatory Visit (INDEPENDENT_AMBULATORY_CARE_PROVIDER_SITE_OTHER): Payer: Medicare Other

## 2023-06-16 VITALS — Wt 145.0 lb

## 2023-06-16 DIAGNOSIS — Z0000 Encounter for general adult medical examination without abnormal findings: Secondary | ICD-10-CM | POA: Diagnosis not present

## 2023-06-16 NOTE — Progress Notes (Addendum)
Subjective:   Amy Turner is a 89 y.o. female who presents for Medicare Annual (Subsequent) preventive examination.  Visit Complete: Virtual  I connected with  Amy Turner on 06/16/23 by a audio enabled telemedicine application and verified that I am speaking with the correct person using two identifiers.  Patient Location: Home  Provider Location: Home Office  I discussed the limitations of evaluation and management by telemedicine. The patient expressed understanding and agreed to proceed.  Vital Signs: Unable to obtain new vitals due to this being a telehealth visit.  Cardiac Risk Factors include: advanced age (>82men, >45 women);hypertension;dyslipidemia     Objective:    Today's Vitals   06/16/23 1337  Weight: 145 lb (65.8 kg)   Body mass index is 28.32 kg/m.     06/16/2023    1:42 PM 05/27/2022    9:59 AM 01/11/2022    1:44 PM 08/17/2021    8:40 AM 06/17/2020    1:41 PM 06/04/2017   10:03 AM 11/05/2016    4:01 PM  Advanced Directives  Does Patient Have a Medical Advance Directive? Yes Yes Yes No No Yes   Type of Estate agent of [place];Living will Living will Living will   Living will;Healthcare Power of Attorney   Does patient want to make changes to medical advance directive? No - Patient declined  No - Patient declined      Copy of Healthcare Power of Attorney in Chart? Yes - validated most recent copy scanned in chart (See row information)     No - copy requested   Would patient like information on creating a medical advance directive?    No - Patient declined        Information is confidential and restricted. Go to Review Flowsheets to unlock data.    Current Medications (verified) Outpatient Encounter Medications as of 06/16/2023  Medication Sig   amLODipine (NORVASC) 10 MG tablet Take 1 tablet (10 mg total) by mouth daily.   brinzolamide (AZOPT) 1 % ophthalmic suspension 1 drop 2 (two) times daily.   latanoprost (XALATAN) 0.005 %  ophthalmic solution Place 1 drop into both eyes at bedtime.   losartan (COZAAR) 50 MG tablet Take 2 tablets (100 mg total) by mouth daily.   metoprolol succinate (TOPROL-XL) 25 MG 24 hr tablet Take 1 tablet (25 mg total) by mouth daily.   Multiple Vitamins-Minerals (ICAPS AREDS FORMULA PO) Take 1 capsule by mouth 2 (two) times daily.    ROCKLATAN 0.02-0.005 % SOLN Apply 1 drop to eye at bedtime.   sertraline (ZOLOFT) 50 MG tablet Take 1 tablet (50 mg total) by mouth daily.   timolol (TIMOPTIC) 0.5 % ophthalmic solution    No facility-administered encounter medications on file as of 06/16/2023.    Allergies (verified) Atorvastatin and Prednisone   History: Past Medical History:  Diagnosis Date   Aortic insufficiency 09/2016   mild/mod aortic insufficiency, and mild mitral insufficiency.--Repeat echo 1 yr.   Baker's cyst of knee 02/25/2012   LE Venous Doppler May 30, '13 - non-vascular swelling/mass left popliteal fossa c/w Baker's cyst    CARCINOMA, BREAST, LEFT 1993   Lumpectomy, adjuvant XRT, then tamoxifen.  Gets annual mammograms, no hx of abnormals--as of 2016, patient has chosen to stop getting screening mammograms.   Chronic renal insufficiency, stage III (moderate) (HCC)    DEPRESSION, SITUATIONAL 03/26/2009   Qualifier: Diagnosis of  By: Debby Bud MD, Rosalyn Gess    GLAUCOMA 06/07/2007   Qualifier: Diagnosis of  By: Genelle Gather CMA, [place]     HYPERLIPIDEMIA 06/07/2007   Qualifier: Diagnosis of  By: Genelle Gather CMA, [place]     HYPERTENSION 06/07/2007   Qualifier: Diagnosis of  By: Genelle Gather CMA, [place]     HYSTERECTOMY, HX OF 06/07/2007   Qualifier: Diagnosis of  By: Genelle Gather CMA, [place]     Personal history of surgery to other organs 06/07/2007   Centricity Description: BLADDER REPAIR, HX OF Qualifier: Diagnosis of  By: Genelle Gather CMA, [place]   Centricity Description: LUMPECTOMY, BREAST, HX OF Qualifier: Diagnosis of  By: Genelle Gather CMA, [place]     POSTMENOPAUSAL OSTEOPOROSIS 11/18/2010    Qualifier: Diagnosis of  By: Debby Bud MD, Rosalyn Gess    Prediabetes 12/2021   April 2023 a1c 6.1%   Past Surgical History:  Procedure Laterality Date   ABDOMINAL HYSTERECTOMY     Benign reasons per pt, but she can't recall exactly what dx.  Ovaries and tubes are still in.     BLADDER REPAIR     BREAST SURGERY  1993   lumpectomy w/adjuvant XRT   EYE SURGERY  July 16, 2010   cataract surgery [facility]    TRANSTHORACIC ECHOCARDIOGRAM  10/09/2016   EF 60-65%, normal wall motion, grd I DD, mild/mod aortic regurg, mild mitral regurg.   Family History  Problem Relation Age of Onset   Hyperlipidemia Mother    Hypertension Mother    Cervical cancer Mother    Coronary artery disease Mother    Depression Father    Colon cancer Neg Hx    Diabetes Neg Hx    Social History   Socioeconomic History   Marital status: Widowed    Spouse name: Not on file   Number of children: 2   Years of education: 75   Highest education level: Not on file  Occupational History   Occupation: homemaker  Tobacco Use   Smoking status: Never   Smokeless tobacco: Never  Vaping Use   Vaping status: Never Used  Substance and Sexual Activity   Alcohol use: No   Drug use: No   Sexual activity: Never  Other Topics Concern   Not on file  Social History Narrative   Married 07/16/49. 2 sons-'58, '59 oldest died CAD/MI; 2 grandchildren.  From [place], [place] originally.   Homemaker, retired from [employer].   Widowed in the spring of 2017.    Little gardening, handcrafts. ACP/End-of-life Issues: Does not want CPR, mechanical ventilation or heroic measures. She has a living will.   Never smoker.  No alcohol.               Social Determinants of Health   Financial Resource Strain: Low Risk  (06/16/2023)   Overall Financial Resource Strain (CARDIA)    Difficulty of Paying Living Expenses: Not hard at all  Food Insecurity: No Food Insecurity (06/16/2023)   Hunger Vital Sign    Worried About Running Out of Food in the Last  Year: Never true    Ran Out of Food in the Last Year: Never true  Transportation Needs: No Transportation Needs (06/16/2023)   PRAPARE - Administrator, Civil Service (Medical): No    Lack of Transportation (Non-Medical): No  Physical Activity: Insufficiently Active (06/16/2023)   Exercise Vital Sign    Days of Exercise per Week: 5 days    Minutes of Exercise per Session: 10 min  Stress: No Stress Concern Present (06/16/2023)   [employer] of Occupational Health - Occupational Stress Questionnaire    Feeling  of Stress : Not at all  Social Connections: Moderately Isolated (06/16/2023)   Social Connection and Isolation Panel [NHANES]    Frequency of Communication with Friends and Family: Three times a week    Frequency of Social Gatherings with Friends and Family: Twice a week    Attends Religious Services: More than 4 times per year    Active Member of [employer] or Organizations: No    Attends Banker Meetings: Never    Marital Status: Widowed    Tobacco Counseling Counseling given: Not Answered   Clinical Intake:  Pre-visit preparation completed: Yes  Pain : No/denies pain     BMI - recorded: 28.32 Nutritional Status: BMI 25 -29 Overweight Nutritional Risks: None Diabetes: No  How often do you need to have someone help you when you read instructions, pamphlets, or other written materials from your doctor or pharmacy?: 1 - Never  Interpreter Needed?: No  Information entered by :: Lanier Ensign, LPN   Activities of Daily Living    06/16/2023    1:38 PM  In your present state of health, do you have any difficulty performing the following activities:  Hearing? 0  Vision? 0  Difficulty concentrating or making decisions? 0  Walking or climbing stairs? 0  Dressing or bathing? 0  Doing errands, shopping? 0  Preparing Food and eating ? N  Using the Toilet? N  In the past six months, have you accidently leaked urine? N  Do you have problems  with loss of bowel control? N  Managing your Medications? N  Managing your Finances? N  Housekeeping or managing your Housekeeping? N    Patient Care Team: Jeoffrey Massed, MD as PCP - General (Family Medicine) Janet Berlin, MD as Consulting Physician (Ophthalmology) Lesle Reek, DDS (Dental General Practice)  Indicate any recent Medical Services you may have received from other than [facility] providers in the past year (date may be approximate).     Assessment:   This is a routine wellness examination for Amy Turner.  Hearing/Vision screen Hearing Screening - Comments:: Pt denies any hearing issues  Vision Screening - Comments:: Pt follows up with Dr  Arthur Holms for eye exams    Goals Addressed             This Visit's Progress    Patient Stated       Keep on living        Depression Screen    06/16/2023    1:41 PM 02/26/2023    1:53 PM 11/20/2022    1:58 PM 08/12/2022    3:58 PM 05/27/2022    9:58 AM 12/30/2021   11:38 AM 05/22/2021   12:49 PM  PHQ 2/9 Scores  PHQ - 2 Score 0 0 6 0 0 4 0  PHQ- 9 Score  0 19        Fall Risk    06/16/2023    1:43 PM 02/26/2023    1:53 PM 11/20/2022    1:57 PM 05/27/2022   10:00 AM 12/30/2021   11:41 AM  Fall Risk   Falls in the past year? 0 0 1 0 0  Number falls in past yr: 0  0 0 0  Injury with Fall? 0  0 0 0  Risk for fall due to : Impaired vision      Follow up Falls prevention discussed  Falls evaluation completed Falls evaluation completed Falls evaluation completed    MEDICARE RISK AT HOME: Medicare Risk at Home Any stairs  in or around the home?: Yes If so, are there any without handrails?: No Home free of loose throw rugs in walkways, pet beds, electrical cords, etc?: Yes Adequate lighting in your home to reduce risk of falls?: Yes Life alert?: No Use of a cane, walker or w/c?: No Grab bars in the bathroom?: No Shower chair or bench in shower?: Yes Elevated toilet seat or a handicapped toilet?: No  TIMED UP AND  GO:  Was the test performed?  No    Cognitive Function:    06/04/2017   10:05 AM  MMSE - Mini Mental State Exam  Orientation to time 5  Orientation to Place 5  Registration 3  Attention/ Calculation 5  Recall 2  Language- name 2 objects 2  Language- repeat 1  Language- follow 3 step command 3  Language- read & follow direction 1  Write a sentence 1  Copy design 1  Total score 29        06/16/2023    1:44 PM 05/27/2022   10:01 AM 05/22/2021   12:42 PM  6CIT Screen  What Year? 0 points 0 points 0 points  What month? 0 points 0 points 0 points  What time? 0 points 0 points 0 points  Count back from 20 0 points 0 points 0 points  Months in reverse 0 points 0 points 0 points  Repeat phrase 0 points 0 points 0 points  Total Score 0 points 0 points 0 points    Immunizations Immunization History  Administered Date(s) Administered   Fluad Quad(high Dose 65+) 08/16/2019, 07/01/2020   Influenza Whole 06/25/2008, 07/29/2009   Influenza, High Dose Seasonal PF 08/14/2013, 08/19/2016, 07/09/2017, 08/05/2018, 05/31/2021   Influenza,inj,Quad PF,6+ Mos 06/25/2014   Influenza-Unspecified 07/09/2015   Pneumococcal Conjugate-13 01/31/2015   Pneumococcal Polysaccharide-23 08/28/2004   Td 11/14/2010   Tdap 03/18/2021    TDAP status: Up to date  Flu Vaccine status: Due, Education has been provided regarding the importance of this vaccine. Advised may receive this vaccine at local pharmacy or Health Dept. Aware to provide a copy of the vaccination record if obtained from local pharmacy or Health Dept. Verbalized acceptance and understanding.  Pneumococcal vaccine status: Up to date  Covid-19 vaccine status: Information provided on how to obtain vaccines.   Qualifies for Shingles Vaccine? Yes   Zostavax completed No   Shingrix Completed?: No.    Education has been provided regarding the importance of this vaccine. Patient has been advised to call insurance company to determine out of  pocket expense if they have not yet received this vaccine. Advised may also receive vaccine at local pharmacy or Health Dept. Verbalized acceptance and understanding.  Screening Tests Health Maintenance  Topic Date Due   INFLUENZA VACCINE  04/29/2023   COVID-19 Vaccine (1) 06/18/2023 (Originally 06/19/1931)   Zoster Vaccines- Shingrix (1 of 2) 09/01/2023 (Originally 06/20/1945)   Medicare Annual Wellness (AWV)  06/15/2024   DTaP/Tdap/Td (3 - Td or Tdap) 03/19/2031   Pneumonia Vaccine 79+ Years old  Completed   DEXA SCAN  Completed   HPV VACCINES  Aged Out    Health Maintenance  Health Maintenance Due  Topic Date Due   INFLUENZA VACCINE  04/29/2023    Colorectal cancer screening: No longer required.   Mammogram status: No longer required due to age.  Bone density 11/19/10   Additional Screening:  Vision Screening: Recommended annual ophthalmology exams for early detection of glaucoma and other disorders of the eye. Is the patient up to  date with their annual eye exam?  Yes  Who is the provider or what is the name of the office in which the patient attends annual eye exams? Dr Arthur Holms  If pt is not established with a provider, would they like to be referred to a provider to establish care? No .   Dental Screening: Recommended annual dental exams for proper oral hygiene   Community Resource Referral / Chronic Care Management: CRR required this visit?  No   CCM required this visit?  No     Plan:     I have personally reviewed and noted the following in the patient's chart:   Medical and social history Use of alcohol, tobacco or illicit drugs  Current medications and supplements including opioid prescriptions. Patient is not currently taking opioid prescriptions. Functional ability and status Nutritional status Physical activity Advanced directives List of other physicians Hospitalizations, surgeries, and ER visits in previous 12 months Vitals Screenings to  include cognitive, depression, and falls Referrals and appointments  In addition, I have reviewed and discussed with patient certain preventive protocols, quality metrics, and best practice recommendations. A written personalized care plan for preventive services as well as general preventive health recommendations were provided to patient.     Marzella Schlein, LPN   9/56/2130   After Visit Summary: (Pick Up) Due to this being a telephonic visit, with patients personalized plan was offered to patient and patient has requested to Pick up at office.  Nurse Notes: none

## 2023-06-16 NOTE — Patient Instructions (Signed)
Amy Turner , Thank you for taking time to come for your Medicare Wellness Visit. I appreciate your ongoing commitment to your health goals. Please review the following plan we discussed and let me know if I can assist you in the future.   Referrals/Orders/Follow-Ups/Clinician Recommendations: pt stated  keep on living   This is a list of the screening recommended for you and due dates:  Health Maintenance  Topic Date Due   Flu Shot  04/29/2023   COVID-19 Vaccine (1) 06/18/2023*   Zoster (Shingles) Vaccine (1 of 2) 09/01/2023*   Medicare Annual Wellness Visit  06/15/2024   DTaP/Tdap/Td vaccine (3 - Td or Tdap) 03/19/2031   Pneumonia Vaccine  Completed   DEXA scan (bone density measurement)  Completed   HPV Vaccine  Aged Out  *Topic was postponed. The date shown is not the original due date.    Advanced directives: (In Chart) A copy of your advanced directives are scanned into your chart should your provider ever need it.  Next Medicare Annual Wellness Visit scheduled for next year: Yes

## 2023-06-30 ENCOUNTER — Other Ambulatory Visit: Payer: Self-pay | Admitting: Family Medicine

## 2023-07-25 ENCOUNTER — Other Ambulatory Visit: Payer: Self-pay | Admitting: Family Medicine

## 2023-07-26 ENCOUNTER — Other Ambulatory Visit: Payer: Self-pay | Admitting: Family Medicine

## 2023-08-02 ENCOUNTER — Other Ambulatory Visit: Payer: Self-pay | Admitting: Family Medicine

## 2023-08-28 ENCOUNTER — Other Ambulatory Visit: Payer: Self-pay | Admitting: Family Medicine

## 2023-09-01 ENCOUNTER — Ambulatory Visit: Payer: Medicare Other | Admitting: Family Medicine

## 2023-09-01 ENCOUNTER — Encounter: Payer: Self-pay | Admitting: Family Medicine

## 2023-09-01 VITALS — BP 144/72 | HR 70 | Wt 150.4 lb

## 2023-09-01 DIAGNOSIS — I10 Essential (primary) hypertension: Secondary | ICD-10-CM | POA: Diagnosis not present

## 2023-09-01 DIAGNOSIS — Z23 Encounter for immunization: Secondary | ICD-10-CM | POA: Diagnosis not present

## 2023-09-01 DIAGNOSIS — G3184 Mild cognitive impairment, so stated: Secondary | ICD-10-CM

## 2023-09-01 DIAGNOSIS — F411 Generalized anxiety disorder: Secondary | ICD-10-CM | POA: Diagnosis not present

## 2023-09-01 MED ORDER — SERTRALINE HCL 50 MG PO TABS: 50.0000 mg | ORAL_TABLET | Freq: Every day | ORAL | 1 refills | Status: DC

## 2023-09-01 MED ORDER — LOSARTAN POTASSIUM 50 MG PO TABS
100.0000 mg | ORAL_TABLET | Freq: Every day | ORAL | 1 refills | Status: DC
Start: 2023-09-01 — End: 2023-12-07

## 2023-09-01 NOTE — Addendum Note (Signed)
Addended by: Arty Baumgartner A on: 09/01/2023 02:25 PM   Modules accepted: Orders

## 2023-09-01 NOTE — Progress Notes (Signed)
OFFICE VISIT  09/01/2023  CC:  Chief Complaint  Patient presents with   Medical Management of Chronic Issues   Patient is a 89 y.o. female who presents for 3 mo f/u HTN, GAD with MCI/memory loss, and prediabetes. A/P as of last visit: "#1 hypertension, well-controlled on amlodipine 10 mg a day, losartan 100 mg a day, and Toprol-XL 25 mg a day. BMET today   #2 GAD with mild cognitive impairment/memory loss. Stable.  Continue sertraline 50 mg a day.   #3 prediabetes. Hemoglobin A1c was 6.1% 15 months ago and then down to 5.8% 3 mo ago. Repeat hemoglobin A1c 3-6 mo."  INTERIM HX: Amy Turner is feeling well.  Denies headaches, dizziness, palpitations, chest pain, or lower extremity swelling.  No home blood pressure monitoring.  She eats a healthy diet.  She ambulates with a walker.  She gets out only go to church.  ROS as above, plus--> no fevers,no SOB, no wheezing, no cough,  no rashes, no melena/hematochezia.  No polyuria or polydipsia.  No myalgias or arthralgias.  No focal weakness, paresthesias, or tremors.  No acute vision or hearing abnormalities.  No dysuria or unusual/new urinary urgency or frequency.  No recent changes in lower legs. No n/v/d or abd pain.  No palpitations.    Past Medical History:  Diagnosis Date   Aortic insufficiency 09/2016   mild/mod aortic insufficiency, and mild mitral insufficiency.--Repeat echo 1 yr.   Baker's cyst of knee 02/25/2012   LE Venous Doppler May 30, '13 - non-vascular swelling/mass left popliteal fossa c/w Baker's cyst    CARCINOMA, BREAST, LEFT 1993   Lumpectomy, adjuvant XRT, then tamoxifen.  Gets annual mammograms, no hx of abnormals--as of 2016, patient has chosen to stop getting screening mammograms.   Chronic renal insufficiency, stage III (moderate) (HCC)    DEPRESSION, SITUATIONAL 03/26/2009   Qualifier: Diagnosis of  By: Debby Bud MD, Rosalyn Gess    GLAUCOMA 06/07/2007   Qualifier: Diagnosis of  By: Genelle Gather CMA, [place]      HYPERLIPIDEMIA 06/07/2007   Qualifier: Diagnosis of  By: Genelle Gather CMA, [place]     HYPERTENSION 06/07/2007   Qualifier: Diagnosis of  By: Genelle Gather CMA, [place]     HYSTERECTOMY, HX OF 06/07/2007   Qualifier: Diagnosis of  By: Genelle Gather CMA, [place]     Personal history of surgery to other organs 06/07/2007   Centricity Description: BLADDER REPAIR, HX OF Qualifier: Diagnosis of  By: Genelle Gather CMA, [place]   Centricity Description: LUMPECTOMY, BREAST, HX OF Qualifier: Diagnosis of  By: Genelle Gather CMA, [place]     POSTMENOPAUSAL OSTEOPOROSIS 11/18/2010   Qualifier: Diagnosis of  By: Debby Bud MD, Rosalyn Gess    Prediabetes 12/2021   April 2023 a1c 6.1%    Past Surgical History:  Procedure Laterality Date   ABDOMINAL HYSTERECTOMY     Benign reasons per pt, but she can't recall exactly what dx.  Ovaries and tubes are still in.     BLADDER REPAIR     BREAST SURGERY  1993   lumpectomy w/adjuvant XRT   EYE SURGERY  2011   cataract surgery [facility]    TRANSTHORACIC ECHOCARDIOGRAM  10/09/2016   EF 60-65%, normal wall motion, grd I DD, mild/mod aortic regurg, mild mitral regurg.   Outpatient Medications Prior to Visit  Medication Sig Dispense Refill   amLODipine (NORVASC) 10 MG tablet TAKE 1 TABLET BY MOUTH EVERY DAY 90 tablet 1   brinzolamide (AZOPT) 1 % ophthalmic suspension 1 drop 2 (two) times daily.  latanoprost (XALATAN) 0.005 % ophthalmic solution Place 1 drop into both eyes at bedtime.     metoprolol succinate (TOPROL-XL) 25 MG 24 hr tablet TAKE 1 TABLET (25 MG TOTAL) BY MOUTH DAILY. 90 tablet 0   Multiple Vitamins-Minerals (ICAPS AREDS FORMULA PO) Take 1 capsule by mouth 2 (two) times daily.      ROCKLATAN 0.02-0.005 % SOLN Apply 1 drop to eye at bedtime.     timolol (TIMOPTIC) 0.5 % ophthalmic solution      losartan (COZAAR) 50 MG tablet Take 2 tablets (100 mg total) by mouth daily. 90 tablet 0   sertraline (ZOLOFT) 50 MG tablet Take 1 tablet (50 mg total) by mouth daily. 90 tablet 0   No  facility-administered medications prior to visit.    Allergies  Allergen Reactions   Atorvastatin Other (See Comments)    Leg myalgias   Prednisone Other (See Comments)    Various cognitive/psychiatric side effects    Review of Systems As per HPI  PE:    09/01/2023    2:03 PM 09/01/2023    1:54 PM 06/16/2023    1:37 PM  Vitals with BMI  Weight  150 lbs 6 oz 145 lbs  BMI   28.32  Systolic 144 156   Diastolic 72 78   Pulse  70    Physical Exam  Gen: Alert, well appearing.  Patient is oriented to person, place, time, and situation. AFFECT: pleasant, lucid thought and speech. CV: RRR, soft systolic murmur, no r/g.   LUNGS: CTA bilat, nonlabored resps, good aeration in all lung fields. EXT: no clubbing or cyanosis.  Trace bilat LE pitting edema,R>L.   LABS:  Last CBC Lab Results  Component Value Date   WBC 9.0 08/10/2022   HGB 12.4 08/10/2022   HCT 38.2 08/10/2022   MCV 95.8 08/10/2022   MCH 31.1 08/17/2021   RDW 13.3 08/10/2022   PLT 242.0 08/10/2022   Last metabolic panel Lab Results  Component Value Date   GLUCOSE 120 (H) 06/02/2023   NA 142 06/02/2023   K 4.7 06/02/2023   CL 106 06/02/2023   CO2 29 06/02/2023   BUN 19 06/02/2023   CREATININE 0.85 06/02/2023   GFR 58.43 (L) 06/02/2023   CALCIUM 9.6 06/02/2023   PROT 6.8 08/17/2021   ALBUMIN 3.9 08/17/2021   BILITOT 0.6 08/17/2021   ALKPHOS 98 08/17/2021   AST 14 (L) 08/17/2021   ALT 7 08/17/2021   ANIONGAP 11 08/17/2021   Last lipids Lab Results  Component Value Date   CHOL 176 01/17/2014   HDL 57.60 01/17/2014   LDLCALC 91 01/17/2014   LDLDIRECT 124.7 11/14/2010   TRIG 139.0 01/17/2014   CHOLHDL 3 01/17/2014   Last hemoglobin A1c Lab Results  Component Value Date   HGBA1C 5.8 02/26/2023   IMPRESSION AND PLAN:  1 hypertension, well-controlled on amlodipine 10 mg a day, losartan 100 mg a day, and Toprol-XL 25 mg a day. BMET normal 3 mo ago.   #2 GAD with mild cognitive impairment/memory  loss. Stable.  Continue sertraline 50 mg a day-> refilled 90-day supply with 1 additional refill today.   #3 prediabetes. Hemoglobin A1c was 6.1% 15 months ago and then down to 5.8% 3 mo ago. Repeat hemoglobin A1c 6 mo.  An After Visit Summary was printed and given to the patient.  FOLLOW UP: Return in about 3 months (around 11/30/2023) for routine chronic illness f/u.  Signed:  Santiago Bumpers, MD  09/01/2023  

## 2023-09-03 ENCOUNTER — Other Ambulatory Visit: Payer: Self-pay | Admitting: Family Medicine

## 2023-09-03 DIAGNOSIS — I10 Essential (primary) hypertension: Secondary | ICD-10-CM

## 2023-09-28 ENCOUNTER — Other Ambulatory Visit: Payer: Self-pay | Admitting: Family Medicine

## 2023-11-28 ENCOUNTER — Other Ambulatory Visit: Payer: Self-pay | Admitting: Family Medicine

## 2023-11-28 DIAGNOSIS — I10 Essential (primary) hypertension: Secondary | ICD-10-CM

## 2023-12-01 ENCOUNTER — Encounter: Payer: Self-pay | Admitting: Family Medicine

## 2023-12-01 ENCOUNTER — Ambulatory Visit (INDEPENDENT_AMBULATORY_CARE_PROVIDER_SITE_OTHER): Payer: Medicare Other | Admitting: Family Medicine

## 2023-12-01 VITALS — BP 140/74 | HR 76 | Temp 98.2°F | Wt 148.2 lb

## 2023-12-01 DIAGNOSIS — F411 Generalized anxiety disorder: Secondary | ICD-10-CM

## 2023-12-01 DIAGNOSIS — I10 Essential (primary) hypertension: Secondary | ICD-10-CM

## 2023-12-01 NOTE — Progress Notes (Signed)
 OFFICE VISIT  12/01/2023  CC:  Chief Complaint  Patient presents with   Hypertension    Patient is a 89 y.o. female who presents for 65-month follow-up GAD, hypertension, and mild cognitive impairment. A/P as of last visit: "1 hypertension, well-controlled on amlodipine 10 mg a day, losartan 100 mg a day, and Toprol-XL 25 mg a day. BMET normal 3 mo ago.   #2 GAD with mild cognitive impairment/memory loss. Stable.  Continue sertraline 50 mg a day-> refilled 90-day supply with 1 additional refill today.   #3 prediabetes. Hemoglobin A1c was 6.1% 15 months ago and then down to 5.8% 3 mo ago. Repeat hemoglobin A1c 6 mo."  INTERIM HX: Feeling well. No acute concerns. She gets out to church every Sunday.  She does word searches every day. She lives with her son.  ROS as above, plus--> no fevers, no CP, no SOB, no wheezing, no cough, no dizziness, no HAs, no rashes, no melena/hematochezia.  No polyuria or polydipsia.  No myalgias or arthralgias.  No focal weakness, paresthesias, or tremors.  No acute vision or hearing abnormalities.  No dysuria or unusual/new urinary urgency or frequency.  No recent changes in lower legs. No n/v/d or abd pain.  No palpitations.    Past Medical History:  Diagnosis Date   Aortic insufficiency 09/2016   mild/mod aortic insufficiency, and mild mitral insufficiency.--Repeat echo 1 yr.   Baker's cyst of knee 02/25/2012   LE Venous Doppler May 30, '13 - non-vascular swelling/mass left popliteal fossa c/w Baker's cyst    CARCINOMA, BREAST, LEFT 1993   Lumpectomy, adjuvant XRT, then tamoxifen.  Gets annual mammograms, no hx of abnormals--as of 2016, patient has chosen to stop getting screening mammograms.   Chronic renal insufficiency, stage III (moderate) (HCC)    DEPRESSION, SITUATIONAL 03/26/2009   Qualifier: Diagnosis of  By: Debby Bud MD, Rosalyn Gess    GLAUCOMA 06/07/2007   Qualifier: Diagnosis of  By: Genelle Gather CMA, [place]     HYPERLIPIDEMIA 06/07/2007    Qualifier: Diagnosis of  By: Genelle Gather CMA, [place]     HYPERTENSION 06/07/2007   Qualifier: Diagnosis of  By: Genelle Gather CMA, [place]     HYSTERECTOMY, HX OF 06/07/2007   Qualifier: Diagnosis of  By: Genelle Gather CMA, [place]     Personal history of surgery to other organs 06/07/2007   Centricity Description: BLADDER REPAIR, HX OF Qualifier: Diagnosis of  By: Genelle Gather CMA, [place]   Centricity Description: LUMPECTOMY, BREAST, HX OF Qualifier: Diagnosis of  By: Genelle Gather CMA, [place]     POSTMENOPAUSAL OSTEOPOROSIS 11/18/2010   Qualifier: Diagnosis of  By: Debby Bud MD, Rosalyn Gess    Prediabetes 12/2021   April 2023 a1c 6.1%    Past Surgical History:  Procedure Laterality Date   ABDOMINAL HYSTERECTOMY     Benign reasons per pt, but she can't recall exactly what dx.  Ovaries and tubes are still in.     BLADDER REPAIR     BREAST SURGERY  1993   lumpectomy w/adjuvant XRT   EYE SURGERY  2011   cataract surgery [facility]    TRANSTHORACIC ECHOCARDIOGRAM  10/09/2016   EF 60-65%, normal wall motion, grd I DD, mild/mod aortic regurg, mild mitral regurg.    Outpatient Medications Prior to Visit  Medication Sig Dispense Refill   amLODipine (NORVASC) 10 MG tablet TAKE 1 TABLET BY MOUTH EVERY DAY 90 tablet 1   brinzolamide (AZOPT) 1 % ophthalmic suspension 1 drop 2 (two) times daily.     latanoprost (XALATAN) 0.005 %  ophthalmic solution Place 1 drop into both eyes at bedtime.     losartan (COZAAR) 50 MG tablet Take 2 tablets (100 mg total) by mouth daily. 90 tablet 1   metoprolol succinate (TOPROL-XL) 25 MG 24 hr tablet TAKE 1 TABLET (25 MG TOTAL) BY MOUTH DAILY. 90 tablet 0   Multiple Vitamins-Minerals (ICAPS AREDS FORMULA PO) Take 1 capsule by mouth 2 (two) times daily.      ROCKLATAN 0.02-0.005 % SOLN Apply 1 drop to eye at bedtime.     sertraline (ZOLOFT) 50 MG tablet Take 1 tablet (50 mg total) by mouth daily. 90 tablet 1   timolol (TIMOPTIC) 0.5 % ophthalmic solution      No facility-administered medications  prior to visit.    Allergies  Allergen Reactions   Atorvastatin Other (See Comments)    Leg myalgias   Prednisone Other (See Comments)    Various cognitive/psychiatric side effects    Review of Systems As per HPI  PE:    12/01/2023    2:26 PM 12/01/2023    2:25 PM 09/01/2023    2:03 PM  Vitals with BMI  Weight  148 lbs 3 oz   Systolic 140 153 782  Diastolic 74 73 72  Pulse  76      Physical Exam  Gen: Alert, well appearing.  Patient is oriented to person, place, time, and situation. AFFECT: pleasant, lucid thought and speech. CV: RRR, 2/6 syst murmur, no diastolic murmur, no r/g.   LUNGS: CTA bilat, nonlabored resps, good aeration in all lung fields. EXT: no clubbing or cyanosis.  No pitting edema.    LABS:  Last CBC Lab Results  Component Value Date   WBC 9.0 08/10/2022   HGB 12.4 08/10/2022   HCT 38.2 08/10/2022   MCV 95.8 08/10/2022   MCH 31.1 08/17/2021   RDW 13.3 08/10/2022   PLT 242.0 08/10/2022   Last metabolic panel Lab Results  Component Value Date   GLUCOSE 120 (H) 06/02/2023   NA 142 06/02/2023   K 4.7 06/02/2023   CL 106 06/02/2023   CO2 29 06/02/2023   BUN 19 06/02/2023   CREATININE 0.85 06/02/2023   GFR 58.43 (L) 06/02/2023   CALCIUM 9.6 06/02/2023   PROT 6.8 08/17/2021   ALBUMIN 3.9 08/17/2021   BILITOT 0.6 08/17/2021   ALKPHOS 98 08/17/2021   AST 14 (L) 08/17/2021   ALT 7 08/17/2021   ANIONGAP 11 08/17/2021   IMPRESSION AND PLAN:  1 hypertension, well-controlled on amlodipine 10 mg a day, losartan 100 mg a day, and Toprol-XL 25 mg a day. BMET today.   #2 GAD with mild cognitive impairment/memory loss. Stable.  Continue sertraline 50 mg a day.   #3 prediabetes. Hemoglobin A1c was 6.1% 15 months ago and then down to 5.8% 3 mo ago. Repeat hemoglobin A1c 3 mo.  An After Visit Summary was printed and given to the patient.  FOLLOW UP: Return in about 3 months (around 03/02/2024) for routine chronic illness f/u.  Signed:  Santiago Bumpers, MD           12/01/2023

## 2023-12-02 LAB — BASIC METABOLIC PANEL
BUN: 21 mg/dL (ref 6–23)
CO2: 26 meq/L (ref 19–32)
Calcium: 9.6 mg/dL (ref 8.4–10.5)
Chloride: 108 meq/L (ref 96–112)
Creatinine, Ser: 0.85 mg/dL (ref 0.40–1.20)
GFR: 58.22 mL/min — ABNORMAL LOW (ref >60.00)
Glucose, Bld: 130 mg/dL — ABNORMAL HIGH (ref 70–99)
Potassium: 4.4 meq/L (ref 3.5–5.1)
Sodium: 144 meq/L (ref 135–145)

## 2023-12-07 ENCOUNTER — Telehealth: Payer: Self-pay

## 2023-12-07 DIAGNOSIS — I10 Essential (primary) hypertension: Secondary | ICD-10-CM

## 2023-12-07 MED ORDER — LOSARTAN POTASSIUM 50 MG PO TABS: 100.0000 mg | ORAL_TABLET | Freq: Every day | ORAL | 1 refills | Status: DC

## 2023-12-07 MED ORDER — METOPROLOL SUCCINATE ER 25 MG PO TB24: 25.0000 mg | ORAL_TABLET | Freq: Every day | ORAL | 1 refills | Status: DC

## 2023-12-07 NOTE — Telephone Encounter (Signed)
 Copied from CRM [id number]. Topic: Clinical - Prescription Issue >> Dec 07, 2023  3:18 PM Amy Turner wrote: Reason for CRM: Received call from son Amy Turner [phone] stated that pt is out of losartan (COZAAR) 50 MG tablet. Please call [phone]

## 2024-02-14 ENCOUNTER — Other Ambulatory Visit: Payer: Self-pay | Admitting: Family Medicine

## 2024-02-14 NOTE — Telephone Encounter (Signed)
 Copied from CRM [id number]. Topic: Clinical - Medication Refill >> Feb 14, 2024  3:24 PM Adonis Hoot wrote: Medication: sertraline  (ZOLOFT ) 50 MG tablet   metoprolol  succinate (TOPROL -XL) 25 MG 24 hr tablet  Has the patient contacted their pharmacy? No (Agent: If no, request that the patient contact the pharmacy for the refill. If patient does not wish to contact the pharmacy document the reason why and proceed with request.) (Agent: If yes, when and what did the pharmacy advise?)  This is the patient's preferred pharmacy:  [id number] - [place], [place] - [address] [place] [address] Phone: [phone] Fax: [phone]  Is this the correct pharmacy for this prescription? Yes If no, delete pharmacy and type the correct one.   Has the prescription been filled recently? No  Is the patient out of the medication? No(few left)  Has the patient been seen for an appointment in the last year OR does the patient have an upcoming appointment? Yes  Can we respond through MyChart? No  Agent: Please be advised that Rx refills may take up to 3 business days. We ask that you follow-up with your pharmacy.

## 2024-02-15 ENCOUNTER — Other Ambulatory Visit: Payer: Self-pay | Admitting: Family Medicine

## 2024-02-15 MED ORDER — METOPROLOL SUCCINATE ER 25 MG PO TB24: 25.0000 mg | ORAL_TABLET | Freq: Every day | ORAL | 0 refills | Status: DC

## 2024-02-15 MED ORDER — SERTRALINE HCL 50 MG PO TABS: 50.0000 mg | ORAL_TABLET | Freq: Every day | ORAL | 0 refills | Status: DC

## 2024-02-23 ENCOUNTER — Other Ambulatory Visit: Payer: Self-pay | Admitting: Family Medicine

## 2024-03-02 ENCOUNTER — Ambulatory Visit: Admitting: Family Medicine

## 2024-03-06 ENCOUNTER — Ambulatory Visit (INDEPENDENT_AMBULATORY_CARE_PROVIDER_SITE_OTHER): Admitting: Family Medicine

## 2024-03-06 ENCOUNTER — Encounter: Payer: Self-pay | Admitting: Family Medicine

## 2024-03-06 VITALS — BP 189/70 | HR 74 | Temp 98.6°F | Wt 147.4 lb

## 2024-03-06 DIAGNOSIS — I10 Essential (primary) hypertension: Secondary | ICD-10-CM

## 2024-03-06 DIAGNOSIS — R7303 Prediabetes: Secondary | ICD-10-CM | POA: Diagnosis not present

## 2024-03-06 DIAGNOSIS — G3184 Mild cognitive impairment, so stated: Secondary | ICD-10-CM

## 2024-03-06 MED ORDER — AMLODIPINE BESYLATE 10 MG PO TABS: 10.0000 mg | ORAL_TABLET | Freq: Every day | ORAL | 3 refills | Status: AC

## 2024-03-06 MED ORDER — METOPROLOL SUCCINATE ER 25 MG PO TB24: 25.0000 mg | ORAL_TABLET | Freq: Every day | ORAL | 3 refills | Status: DC

## 2024-03-06 MED ORDER — SERTRALINE HCL 50 MG PO TABS: 50.0000 mg | ORAL_TABLET | Freq: Every day | ORAL | 3 refills | Status: AC
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
## 2024-03-07 ENCOUNTER — Ambulatory Visit: Payer: Self-pay | Admitting: Family Medicine

## 2024-03-07 LAB — BASIC METABOLIC PANEL WITH GFR
BUN: 22 mg/dL (ref 7–25)
CO2: 24 mmol/L (ref 20–32)
Calcium: 9.9 mg/dL (ref 8.6–10.4)
Chloride: 108 mmol/L (ref 98–110)
Creat: 0.8 mg/dL (ref 0.60–0.95)
Glucose, Bld: 101 mg/dL — ABNORMAL HIGH (ref 65–99)
Potassium: 4.2 mmol/L (ref 3.5–5.3)
Sodium: 143 mmol/L (ref 135–146)
eGFR: 68 mL/min/{1.73_m2} (ref >=60)

## 2024-03-07 LAB — HEMOGLOBIN A1C
Hgb A1c MFr Bld: 6 % — ABNORMAL HIGH (ref <5.7)
Mean Plasma Glucose: 126 mg/dL
eAG (mmol/L): 7 mmol/L

## 2024-03-08 ENCOUNTER — Other Ambulatory Visit: Payer: Self-pay | Admitting: Family Medicine

## 2024-03-14 ENCOUNTER — Telehealth: Payer: Self-pay

## 2024-03-14 NOTE — Telephone Encounter (Signed)
 Spoke with pt and she is unsure of what medication we are referring to. She request we call back around 1:15 to speak with her son, Amy Turner.

## 2024-03-14 NOTE — Telephone Encounter (Signed)
 Copied from CRM [id number]. Topic: Clinical - Prescription Issue >> Mar 13, 2024  3:41 PM Jim Motts C wrote: Reason for CRM: Son of patient, Zonnie Landen, called in and stated that he received [facility] that states prescriber denied request for RX BRI. He doesn't know if it's an issue of an appointment or not. He stated patient saw provider recently and will see him again in July. Patient may be contacted [phone]. He also stated he would follow up with pharmacy to see.

## 2024-03-14 NOTE — Telephone Encounter (Signed)
 Returned call to patient to speak with son, Ernestina Headland but he was not available. Patient recommended calling back later

## 2024-03-24 ENCOUNTER — Ambulatory Visit: Payer: Self-pay

## 2024-03-24 DIAGNOSIS — I10 Essential (primary) hypertension: Secondary | ICD-10-CM | POA: Diagnosis not present

## 2024-03-24 NOTE — Telephone Encounter (Signed)
 FYI Only or Action Required?: FYI only for provider.  Patient was last seen in primary care on 03/06/2024 by McGowen, Aleene DEL, MD. Called Nurse Triage reporting Hypertension. Symptoms began today. Interventions attempted: Nothing. Symptoms are: stable.  Triage Disposition: See HCP Within 4 Hours (Or PCP Triage)-recommended to UC for BP recheck  Patient/caregiver understands and will follow disposition?: Yes  Copied from CRM [id number]. Topic: Clinical - Red Word Triage >> Mar 24, 2024  4:22 PM Gennette ORN wrote: Red Word that prompted transfer to Nurse Triage: Patient son Franky calling in about the patient high blood pressure it went from 208/81 went from 201/ 89 to 193/92 at 3:15pm. Patient son wants to know what should he do far as it being high. Reason for Disposition  [1] Systolic BP  >= 200 OR Diastolic >= 120 AND [2] having NO cardiac or neurologic symptoms  Answer Assessment - Initial Assessment Questions 1. BLOOD PRESSURE: What is the blood pressure? Did you take at least two measurements 5 minutes apart?     208/81, 201/89 193/92 2. ONSET: When did you take your blood pressure?     Around 3:15 PM 3. HOW: How did you take your blood pressure? (e.g., automatic home BP monitor, visiting nurse)     [facility]. 4. HISTORY: Do you have a history of high blood pressure?     yes 5. MEDICINES: Are you taking any medicines for blood pressure? Have you missed any doses recently?     Amlodipine , metoprolol , Losartan ,  6. OTHER SYMPTOMS: Do you have any symptoms? (e.g., blurred vision, chest pain, difficulty breathing, headache, weakness)     No  Patient's son called with concerns for blood pressure readings. Reports blood pressure readings were done [facility]. No symptoms. Discussed with son the need for a home BP machine to check her blood pressure. Recommended patient get checked out at urgent care with the readings she had to double check  her blood pressure.  Protocols used: Blood Pressure - High-A-AH

## 2024-03-31 ENCOUNTER — Other Ambulatory Visit: Payer: Self-pay | Admitting: Family Medicine

## 2024-04-03 ENCOUNTER — Other Ambulatory Visit: Payer: Self-pay | Admitting: Family Medicine

## 2024-04-17 ENCOUNTER — Ambulatory Visit (INDEPENDENT_AMBULATORY_CARE_PROVIDER_SITE_OTHER): Admitting: Family Medicine

## 2024-04-17 ENCOUNTER — Encounter: Payer: Self-pay | Admitting: Family Medicine

## 2024-04-17 VITALS — BP 166/68 | HR 60 | Temp 97.7°F | Wt 147.4 lb

## 2024-04-17 DIAGNOSIS — I10 Essential (primary) hypertension: Secondary | ICD-10-CM | POA: Diagnosis not present

## 2024-04-17 MED ORDER — HYDROCHLOROTHIAZIDE 12.5 MG PO TABS: 12.5000 mg | ORAL_TABLET | Freq: Every day | ORAL | 0 refills | Status: DC

## 2024-04-17 MED ORDER — METOPROLOL SUCCINATE ER 100 MG PO TB24: 100.0000 mg | ORAL_TABLET | Freq: Every day | ORAL | Status: DC

## 2024-04-17 MED ORDER — LOSARTAN POTASSIUM 50 MG PO TABS: 100.0000 mg | ORAL_TABLET | Freq: Every day | ORAL | 1 refills | Status: DC

## 2024-04-17 NOTE — Patient Instructions (Addendum)
 Blood pressure medications: Amlodipine  Losartan  Metoprolol   Adding Hydrochlorothiazide 

## 2024-04-17 NOTE — Progress Notes (Signed)
 OFFICE VISIT  04/17/2024  CC:  Chief Complaint  Patient presents with   Hypertension    Patient is a 89 y.o. female who presents accompanied by her son for 6-week follow-up hypertension. A/P as of last visit: #1 hypertension, BP significantly elevated here today.  She is anxious/angry about recent phone calls.  Encouraged her son to get her blood pressure checked at the pharmacy when she picks her medications up. Continue amlodipine  10 mg a day, losartan  100 mg a day, and Toprol -XL 25 mg a day. BMET today.   #2 GAD with mild cognitive impairment/memory loss. Stable.  Continue sertraline  50 mg a day.   #3 prediabetes. Hemoglobin A1c was 6.1% 15 months ago and then down to 5.8% 1 year ago. Check hemoglobin A1c today.  INTERIM HX: Amiel says she feels well. Blood pressures at home lately have been averaging 180 systolic, 70s diastolic. [facility] her Toprol -XL to 100 mg a day.  Past Medical History:  Diagnosis Date   Aortic insufficiency 09/2016   mild/mod aortic insufficiency, and mild mitral insufficiency.--Repeat echo 1 yr.   Baker's cyst of knee 02/25/2012   LE Venous Doppler May 30, '13 - non-vascular swelling/mass left popliteal fossa c/w Baker's cyst    CARCINOMA, BREAST, LEFT 1993   Lumpectomy, adjuvant XRT, then tamoxifen.  Gets annual mammograms, no hx of abnormals--as of 2016, patient has chosen to stop getting screening mammograms.   Chronic renal insufficiency, stage III (moderate) (HCC)    DEPRESSION, SITUATIONAL 03/26/2009   Qualifier: Diagnosis of  By: Harlow MD, Ozell BRAVO    GLAUCOMA 06/07/2007   Qualifier: Diagnosis of  By: Nickola CMA, [place]     HYPERLIPIDEMIA 06/07/2007   Qualifier: Diagnosis of  By: Nickola CMA, [place]     HYPERTENSION 06/07/2007   Qualifier: Diagnosis of  By: Nickola CMA, [place]     HYSTERECTOMY, HX OF 06/07/2007   Qualifier: Diagnosis of  By: Nickola CMA, [place]     Personal history of surgery  to other organs 06/07/2007   Centricity Description: BLADDER REPAIR, HX OF Qualifier: Diagnosis of  By: Nickola CMA, [place]   Centricity Description: LUMPECTOMY, BREAST, HX OF Qualifier: Diagnosis of  By: Nickola CMA, [place]     POSTMENOPAUSAL OSTEOPOROSIS 11/18/2010   Qualifier: Diagnosis of  By: Harlow MD, Ozell BRAVO    Prediabetes 12/2021   April 2023 a1c 6.1%    Past Surgical History:  Procedure Laterality Date   ABDOMINAL HYSTERECTOMY     Benign reasons per pt, but she can't recall exactly what dx.  Ovaries and tubes are still in.     BLADDER REPAIR     BREAST SURGERY  1993   lumpectomy w/adjuvant XRT   EYE SURGERY  2011   cataract surgery [facility]    TRANSTHORACIC ECHOCARDIOGRAM  10/09/2016   EF 60-65%, normal wall motion, grd I DD, mild/mod aortic regurg, mild mitral regurg.    Outpatient Medications Prior to Visit  Medication Sig Dispense Refill   amLODipine  (NORVASC ) 10 MG tablet Take 1 tablet (10 mg total) by mouth daily. 90 tablet 3   brinzolamide (AZOPT) 1 % ophthalmic suspension 1 drop 2 (two) times daily.     latanoprost (XALATAN) 0.005 % ophthalmic solution Place 1 drop into both eyes at bedtime.     losartan  (COZAAR ) 50 MG tablet TAKE 2 TABLETS BY MOUTH EVERY DAY 60 tablet 0   metoprolol  succinate (TOPROL -XL) 25 MG 24 hr tablet Take 1 tablet (  25 mg total) by mouth daily. 90 tablet 3   Multiple Vitamins-Minerals (ICAPS AREDS FORMULA PO) Take 1 capsule by mouth 2 (two) times daily.      ROCKLATAN 0.02-0.005 % SOLN Apply 1 drop to eye at bedtime.     sertraline  (ZOLOFT ) 50 MG tablet Take 1 tablet (50 mg total) by mouth daily. 90 tablet 3   timolol (TIMOPTIC) 0.5 % ophthalmic solution      No facility-administered medications prior to visit.    Allergies  Allergen Reactions   Atorvastatin Other (See Comments)    Leg myalgias   Prednisone  Other (See Comments)    Various cognitive/psychiatric side effects    Review of Systems As per HPI  PE:    04/17/2024     3:01 PM 04/17/2024    2:58 PM 03/06/2024    3:34 PM  Vitals with BMI  Weight  147 lbs 6 oz   Systolic 166 181 810  Diastolic 68 67 70  Pulse  60    Physical Exam  Gen: Alert, well appearing.  Patient is oriented to person, place, time, and situation. AFFECT: pleasant,  CV: RRR, 2/6 syst murmur, no diastolic murmur EXT: no edema  LABS:  Last metabolic panel Lab Results  Component Value Date   GLUCOSE 101 (H) 03/06/2024   NA 143 03/06/2024   K 4.2 03/06/2024   CL 108 03/06/2024   CO2 24 03/06/2024   BUN 22 03/06/2024   CREATININE 0.80 03/06/2024   EGFR 68 03/06/2024   CALCIUM  9.9 03/06/2024   PROT 6.8 08/17/2021   ALBUMIN 3.9 08/17/2021   BILITOT 0.6 08/17/2021   ALKPHOS 98 08/17/2021   AST 14 (L) 08/17/2021   ALT 7 08/17/2021   ANIONGAP 11 08/17/2021   Lab Results  Component Value Date   HGBA1C 6.0 (H) 03/06/2024   IMPRESSION AND PLAN:  Uncontrolled hypertension. Add HCTZ 12.5 mg a day. Continue everything else as is: Toprol -XL 100 mg a day, losartan  50 mg twice a day, and amlodipine  10 mg a day. Recheck bmet at next follow-up in 2 weeks. Continue daily home blood pressure monitoring. Our goal systolic for her will be around 140.  An After Visit Summary was printed and given to the patient.  FOLLOW UP: No follow-ups on file.  Signed:  Gerlene Hockey, MD           04/17/2024

## 2024-05-01 ENCOUNTER — Encounter: Payer: Self-pay | Admitting: Family Medicine

## 2024-05-01 ENCOUNTER — Ambulatory Visit (INDEPENDENT_AMBULATORY_CARE_PROVIDER_SITE_OTHER): Admitting: Family Medicine

## 2024-05-01 VITALS — BP 144/68 | HR 59 | Temp 98.1°F | Ht 60.0 in | Wt 145.0 lb

## 2024-05-01 DIAGNOSIS — R2242 Localized swelling, mass and lump, left lower limb: Secondary | ICD-10-CM | POA: Diagnosis not present

## 2024-05-01 DIAGNOSIS — I10 Essential (primary) hypertension: Secondary | ICD-10-CM

## 2024-05-01 MED ORDER — HYDROCHLOROTHIAZIDE 25 MG PO TABS: 25.0000 mg | ORAL_TABLET | Freq: Every day | ORAL | 1 refills | Status: DC

## 2024-05-01 MED ORDER — CEPHALEXIN 500 MG PO CAPS
500.0000 mg | ORAL_CAPSULE | Freq: Three times a day (TID) | ORAL | 0 refills | Status: AC
Start: 2024-05-01 — End: 2024-05-08

## 2024-05-01 MED ORDER — METOPROLOL SUCCINATE ER 100 MG PO TB24: 100.0000 mg | ORAL_TABLET | Freq: Every day | ORAL | 3 refills | Status: DC

## 2024-05-01 NOTE — Progress Notes (Signed)
 OFFICE VISIT  05/01/2024  CC:  Chief Complaint  Patient presents with   Medical Management of Chronic Issues    2 week f/u HTN; pt's son sates BP has come down some. Pt is out of 100mg  Metoprolol  so he has been giving her 4 25mg  tablets    Patient is a 89 y.o. female who presents accompanied by her son for 2-week follow-up uncontrolled hypertension. A/P as of last visit: Uncontrolled hypertension. Add HCTZ 12.5 mg a day. Continue everything else as is: Toprol -XL 100 mg a day, losartan  50 mg twice a day, and amlodipine  10 mg a day. Recheck bmet at next follow-up in 2 weeks. Continue daily home blood pressure monitoring. Our goal systolic for her will be around 140.  INTERIM HX: Feeling well. Home blood pressures gradually have come down in the 150s to 160s over the 70s.  She has a skin lesion on the inside of her left thigh.  She does not recall how she got it.  It does not itch.  He feels a bit uncomfortable to touch but not exactly painful.  She feels like it has improved over the last couple days.   Past Medical History:  Diagnosis Date   Aortic insufficiency 09/2016   mild/mod aortic insufficiency, and mild mitral insufficiency.--Repeat echo 1 yr.   Baker's cyst of knee 02/25/2012   LE Venous Doppler May 30, '13 - non-vascular swelling/mass left popliteal fossa c/w Baker's cyst    CARCINOMA, BREAST, LEFT 1993   Lumpectomy, adjuvant XRT, then tamoxifen.  Gets annual mammograms, no hx of abnormals--as of 2016, patient has chosen to stop getting screening mammograms.   Chronic renal insufficiency, stage III (moderate) (HCC)    DEPRESSION, SITUATIONAL 03/26/2009   Qualifier: Diagnosis of  By: Harlow MD, Ozell BRAVO    GLAUCOMA 06/07/2007   Qualifier: Diagnosis of  By: Nickola CMA, [place]     HYPERLIPIDEMIA 06/07/2007   Qualifier: Diagnosis of  By: Nickola CMA, [place]     HYPERTENSION 06/07/2007   Qualifier: Diagnosis of  By: Nickola CMA, [place]     HYSTERECTOMY, HX OF  06/07/2007   Qualifier: Diagnosis of  By: Nickola CMA, [place]     Personal history of surgery to other organs 06/07/2007   Centricity Description: BLADDER REPAIR, HX OF Qualifier: Diagnosis of  By: Nickola CMA, [place]   Centricity Description: LUMPECTOMY, BREAST, HX OF Qualifier: Diagnosis of  By: Nickola CMA, [place]     POSTMENOPAUSAL OSTEOPOROSIS 11/18/2010   Qualifier: Diagnosis of  By: Harlow MD, Ozell BRAVO    Prediabetes 12/2021   April 2023 a1c 6.1%    Past Surgical History:  Procedure Laterality Date   ABDOMINAL HYSTERECTOMY     Benign reasons per pt, but she can't recall exactly what dx.  Ovaries and tubes are still in.     BLADDER REPAIR     BREAST SURGERY  1993   lumpectomy w/adjuvant XRT   EYE SURGERY  2011   cataract surgery [facility]    TRANSTHORACIC ECHOCARDIOGRAM  10/09/2016   EF 60-65%, normal wall motion, grd I DD, mild/mod aortic regurg, mild mitral regurg.    Outpatient Medications Prior to Visit  Medication Sig Dispense Refill   amLODipine  (NORVASC ) 10 MG tablet Take 1 tablet (10 mg total) by mouth daily. 90 tablet 3   brinzolamide (AZOPT) 1 % ophthalmic suspension 1 drop 2 (two) times daily.     latanoprost (XALATAN) 0.005 % ophthalmic solution Place 1 drop into both eyes at bedtime.  losartan  (COZAAR ) 50 MG tablet Take 2 tablets (100 mg total) by mouth daily. 180 tablet 1   Multiple Vitamins-Minerals (ICAPS AREDS FORMULA PO) Take 1 capsule by mouth 2 (two) times daily.      ROCKLATAN 0.02-0.005 % SOLN Apply 1 drop to eye at bedtime.     sertraline  (ZOLOFT ) 50 MG tablet Take 1 tablet (50 mg total) by mouth daily. 90 tablet 3   timolol (TIMOPTIC) 0.5 % ophthalmic solution      hydrochlorothiazide  (HYDRODIURIL ) 12.5 MG tablet Take 1 tablet (12.5 mg total) by mouth daily. 30 tablet 0   metoprolol  succinate (TOPROL -XL) 100 MG 24 hr tablet Take 1 tablet (100 mg total) by mouth daily. Take with or immediately following a meal.     No facility-administered medications  prior to visit.    Allergies  Allergen Reactions   Atorvastatin Other (See Comments)    Leg myalgias   Prednisone  Other (See Comments)    Various cognitive/psychiatric side effects    Review of Systems As per HPI  PE:    05/01/2024    2:43 PM 04/17/2024    3:01 PM 04/17/2024    2:58 PM  Vitals with BMI  Height 5' 0    Weight 145 lbs  147 lbs 6 oz  BMI 28.32    Systolic 144 166 818  Diastolic 68 68 67  Pulse 59  60     Physical Exam  General: Alert and well-appearing Left leg -->inner thigh about midway down shows a mildly erythematous 2 cm fluctuant subcu nodule that appears a bit translucent in a couple of areas.  No active drainage.  No tenderness.  No streaking.  LABS:  Last metabolic panel Lab Results  Component Value Date   GLUCOSE 101 (H) 03/06/2024   NA 143 03/06/2024   K 4.2 03/06/2024   CL 108 03/06/2024   CO2 24 03/06/2024   BUN 22 03/06/2024   CREATININE 0.80 03/06/2024   EGFR 68 03/06/2024   CALCIUM  9.9 03/06/2024   PROT 6.8 08/17/2021   ALBUMIN 3.9 08/17/2021   BILITOT 0.6 08/17/2021   ALKPHOS 98 08/17/2021   AST 14 (L) 08/17/2021   ALT 7 08/17/2021   ANIONGAP 11 08/17/2021   Last hemoglobin A1c Lab Results  Component Value Date   HGBA1C 6.0 (H) 03/06/2024   IMPRESSION AND PLAN:  #1 uncontrolled hypertension. She is slightly improved since adding HCTZ 12.5 mg daily. Increase HCTZ to 25 mg daily.  Continue Toprol -XL 100 mg a day, amlodipine  10 mg a day, and losartan  100 mg a day. Basic metabolic panel today.  2.  Left thigh nodular and fluctuant lesion consistent with infected insect bite versus epidermal inclusion cyst. Encouraged heat application for 20 minutes twice a day. Keflex  500 mg 3 times daily x 7 days prescribed today.  An After Visit Summary was printed and given to the patient.  FOLLOW UP: Return in about 3 weeks (around 05/22/2024) for f/u HTN.  Signed:  Gerlene Hockey, MD           05/01/2024

## 2024-05-02 ENCOUNTER — Ambulatory Visit: Payer: Self-pay | Admitting: Family Medicine

## 2024-05-02 LAB — BASIC METABOLIC PANEL WITH GFR
BUN: 23 mg/dL (ref 6–23)
CO2: 27 meq/L (ref 19–32)
Calcium: 9.6 mg/dL (ref 8.4–10.5)
Chloride: 105 meq/L (ref 96–112)
Creatinine, Ser: 1.04 mg/dL (ref 0.40–1.20)
GFR: 45.57 mL/min — ABNORMAL LOW (ref >60.00)
Glucose, Bld: 129 mg/dL — ABNORMAL HIGH (ref 70–99)
Potassium: 4.4 meq/L (ref 3.5–5.1)
Sodium: 142 meq/L (ref 135–145)

## 2024-05-22 ENCOUNTER — Ambulatory Visit (INDEPENDENT_AMBULATORY_CARE_PROVIDER_SITE_OTHER): Admitting: Family Medicine

## 2024-05-22 ENCOUNTER — Encounter: Payer: Self-pay | Admitting: Family Medicine

## 2024-05-22 VITALS — BP 146/60 | HR 60 | Temp 98.3°F | Ht 60.0 in | Wt 146.6 lb

## 2024-05-22 DIAGNOSIS — I10 Essential (primary) hypertension: Secondary | ICD-10-CM

## 2024-05-22 NOTE — Patient Instructions (Signed)
 Check blood pressure and heart rate ONCE per day.  Call or make a return appointment if blood pressure is 180 on top or >85 on bottom for 2 consecutive checks (at least 1 hour apart).

## 2024-05-22 NOTE — Progress Notes (Signed)
 OFFICE VISIT  05/22/2024  CC:  Chief Complaint  Patient presents with   Medical Management of Chronic Issues    3 week f/u HTN    Patient is a 89 y.o. female who presents unaccompanied for 3-week follow-up uncontrolled hypertension. A/P as of last visit: #1 uncontrolled hypertension. She is slightly improved since adding HCTZ 12.5 mg daily. Increase HCTZ to 25 mg daily.  Continue Toprol -XL 100 mg a day, amlodipine  10 mg a day, and losartan  100 mg a day. Basic metabolic panel today.  INTERIM HX: Labs stable last visit.  Feels good. Home blood pressures ranging 140-170 systolic, typically in the 70s diastolic, heart rate anywhere from 55-75. No dizziness, weakness, or lower extremity swelling.  Past Medical History:  Diagnosis Date   Aortic insufficiency 09/2016   mild/mod aortic insufficiency, and mild mitral insufficiency.--Repeat echo 1 yr.   Baker's cyst of knee 02/25/2012   LE Venous Doppler May 30, '13 - non-vascular swelling/mass left popliteal fossa c/w Baker's cyst    CARCINOMA, BREAST, LEFT 1993   Lumpectomy, adjuvant XRT, then tamoxifen.  Gets annual mammograms, no hx of abnormals--as of 2016, patient has chosen to stop getting screening mammograms.   Chronic renal insufficiency, stage III (moderate) (HCC)    DEPRESSION, SITUATIONAL 03/26/2009   Qualifier: Diagnosis of  By: Harlow MD, Ozell BRAVO    GLAUCOMA 06/07/2007   Qualifier: Diagnosis of  By: Nickola CMA, [place]     HYPERLIPIDEMIA 06/07/2007   Qualifier: Diagnosis of  By: Nickola CMA, [place]     HYPERTENSION 06/07/2007   Qualifier: Diagnosis of  By: Nickola CMA, [place]     HYSTERECTOMY, HX OF 06/07/2007   Qualifier: Diagnosis of  By: Nickola CMA, [place]     Personal history of surgery to other organs 06/07/2007   Centricity Description: BLADDER REPAIR, HX OF Qualifier: Diagnosis of  By: Nickola CMA, [place]   Centricity Description: LUMPECTOMY, BREAST, HX OF Qualifier: Diagnosis of  By: Nickola CMA, [place]      POSTMENOPAUSAL OSTEOPOROSIS 11/18/2010   Qualifier: Diagnosis of  By: Harlow MD, Ozell BRAVO    Prediabetes 12/2021   April 2023 a1c 6.1%    Past Surgical History:  Procedure Laterality Date   ABDOMINAL HYSTERECTOMY     Benign reasons per pt, but she can't recall exactly what dx.  Ovaries and tubes are still in.     BLADDER REPAIR     BREAST SURGERY  1993   lumpectomy w/adjuvant XRT   EYE SURGERY  2011   cataract surgery [facility]    TRANSTHORACIC ECHOCARDIOGRAM  10/09/2016   EF 60-65%, normal wall motion, grd I DD, mild/mod aortic regurg, mild mitral regurg.    Outpatient Medications Prior to Visit  Medication Sig Dispense Refill   amLODipine  (NORVASC ) 10 MG tablet Take 1 tablet (10 mg total) by mouth daily. 90 tablet 3   brinzolamide (AZOPT) 1 % ophthalmic suspension 1 drop 2 (two) times daily.     hydrochlorothiazide  (HYDRODIURIL ) 25 MG tablet Take 1 tablet (25 mg total) by mouth daily. 30 tablet 1   latanoprost (XALATAN) 0.005 % ophthalmic solution Place 1 drop into both eyes at bedtime.     losartan  (COZAAR ) 50 MG tablet Take 2 tablets (100 mg total) by mouth daily. 180 tablet 1   metoprolol  succinate (TOPROL -XL) 100 MG 24 hr tablet Take 1 tablet (100 mg total) by mouth daily. Take with or immediately following a meal. 90 tablet 3   Multiple Vitamins-Minerals (ICAPS AREDS FORMULA PO) Take 1  capsule by mouth 2 (two) times daily.      ROCKLATAN 0.02-0.005 % SOLN Apply 1 drop to eye at bedtime.     sertraline  (ZOLOFT ) 50 MG tablet Take 1 tablet (50 mg total) by mouth daily. 90 tablet 3   timolol (TIMOPTIC) 0.5 % ophthalmic solution      No facility-administered medications prior to visit.    Allergies  Allergen Reactions   Atorvastatin Other (See Comments)    Leg myalgias   Prednisone  Other (See Comments)    Various cognitive/psychiatric side effects    Review of Systems As per HPI  PE:    05/22/2024    2:47 PM 05/22/2024    2:29 PM 05/01/2024    2:43 PM  Vitals with  BMI  Height  5' 0 5' 0  Weight  146 lbs 10 oz 145 lbs  BMI  28.63 28.32  Systolic 146 152 855  Diastolic 60 64 68  Pulse  60 59    Physical Exam  General: Alert and well-appearing. Cardiovascular: Regular rhythm and rate with 1-2/6 systolic murmur, no diastolic murmur. Lungs are clear bilaterally, breathing nonlabored. Extremities: No edema.  LABS:  Last metabolic panel Lab Results  Component Value Date   GLUCOSE 129 (H) 05/01/2024   NA 142 05/01/2024   K 4.4 05/01/2024   CL 105 05/01/2024   CO2 27 05/01/2024   BUN 23 05/01/2024   CREATININE 1.04 05/01/2024   GFR 45.57 (L) 05/01/2024   CALCIUM  9.6 05/01/2024   PROT 6.8 08/17/2021   ALBUMIN 3.9 08/17/2021   BILITOT 0.6 08/17/2021   ALKPHOS 98 08/17/2021   AST 14 (L) 08/17/2021   ALT 7 08/17/2021   ANIONGAP 11 08/17/2021   IMPRESSION AND PLAN:  #1 hypertension, not ideal control but I think any addition to her current regimen carries risks that outweigh the benefits. Continue Toprol -XL 100 mg daily, HCTZ 25 mg daily, amlodipine  10 mg daily, and losartan  100 mg a day. Instructions:  Check blood pressure and heart rate ONCE per day. Call or make a return appointment if blood pressure is 180 on top or >85 on bottom for 2 consecutive checks (at least 1 hour apart).  An After Visit Summary was printed and given to the patient.  FOLLOW UP: Return in about 3 months (around 08/22/2024) for routine chronic illness f/u.  Signed:  Gerlene Hockey, MD           05/22/2024

## 2024-05-24 ENCOUNTER — Other Ambulatory Visit: Payer: Self-pay | Admitting: Family Medicine

## 2024-06-21 ENCOUNTER — Ambulatory Visit (INDEPENDENT_AMBULATORY_CARE_PROVIDER_SITE_OTHER): Payer: Medicare Other | Admitting: *Deleted

## 2024-06-21 VITALS — Ht 62.0 in | Wt 146.0 lb

## 2024-06-21 DIAGNOSIS — Z0000 Encounter for general adult medical examination without abnormal findings: Secondary | ICD-10-CM | POA: Diagnosis not present

## 2024-06-21 NOTE — Patient Instructions (Signed)
 Amy Turner , Thank you for taking time to come for your Medicare Wellness Visit. I appreciate your ongoing commitment to your health goals. Please review the following plan we discussed and let me know if I can assist you in the future.   Screening recommendations/referrals: Colonoscopy: no longer required Mammogram: no longer required Bone Density: Education provided Recommended yearly ophthalmology/optometry visit for glaucoma screening and checkup Recommended yearly dental visit for hygiene and checkup  Vaccinations: Influenza vaccine: Education provided Pneumococcal vaccine: Education provided Tdap vaccine: up to date Shingles vaccine: Education provided       Preventive Care 65 Years and Older, Female Preventive care refers to lifestyle choices and visits with your health care provider that can promote health and wellness. What does preventive care include? A yearly physical exam. This is also called an annual well check. Dental exams once or twice a year. Routine eye exams. Ask your health care provider how often you should have your eyes checked. Personal lifestyle choices, including: Daily care of your teeth and gums. Regular physical activity. Eating a healthy diet. Avoiding tobacco and drug use. Limiting alcohol use. Practicing safe sex. Taking low-dose aspirin every day. Taking vitamin and mineral supplements as recommended by your health care provider. What happens during an annual well check? The services and screenings done by your health care provider during your annual well check will depend on your age, overall health, lifestyle risk factors, and family history of disease. Counseling  Your health care provider may ask you questions about your: Alcohol use. Tobacco use. Drug use. Emotional well-being. Home and relationship well-being. Sexual activity. Eating habits. History of falls. Memory and ability to understand (cognition). Work and work  Astronomer. Reproductive health. Screening  You may have the following tests or measurements: Height, weight, and BMI. Blood pressure. Lipid and cholesterol levels. These may be checked every 5 years, or more frequently if you are over 77 years old. Skin check. Lung cancer screening. You may have this screening every year starting at age 26 if you have a 30-pack-year history of smoking and currently smoke or have quit within the past 15 years. Fecal occult blood test (FOBT) of the stool. You may have this test every year starting at age 69. Flexible sigmoidoscopy or colonoscopy. You may have a sigmoidoscopy every 5 years or a colonoscopy every 10 years starting at age 1. Hepatitis C blood test. Hepatitis B blood test. Sexually transmitted disease (STD) testing. Diabetes screening. This is done by checking your blood sugar (glucose) after you have not eaten for a while (fasting). You may have this done every 1-3 years. Bone density scan. This is done to screen for osteoporosis. You may have this done starting at age 71. Mammogram. This may be done every 1-2 years. Talk to your health care provider about how often you should have regular mammograms. Talk with your health care provider about your test results, treatment options, and if necessary, the need for more tests. Vaccines  Your health care provider may recommend certain vaccines, such as: Influenza vaccine. This is recommended every year. Tetanus, diphtheria, and acellular pertussis (Tdap, Td) vaccine. You may need a Td booster every 10 years. Zoster vaccine. You may need this after age 76. Pneumococcal 13-valent conjugate (PCV13) vaccine. One dose is recommended after age 61. Pneumococcal polysaccharide (PPSV23) vaccine. One dose is recommended after age 20. Talk to your health care provider about which screenings and vaccines you need and how often you need them. This information is not  intended to replace advice given to you by  your health care provider. Make sure you discuss any questions you have with your health care provider. Document Released: 10/11/2015 Document Revised: 06/03/2016 Document Reviewed: 07/16/2015 Elsevier Interactive Patient Education  2017 [employer].  Fall Prevention in the Home Falls can cause injuries. They can happen to people of all ages. There are many things you can do to make your home safe and to help prevent falls. What can I do on the outside of my home? Regularly fix the edges of walkways and driveways and fix any cracks. Remove anything that might make you trip as you walk through a door, such as a raised step or threshold. Trim any bushes or trees on the path to your home. Use bright outdoor lighting. Clear any walking paths of anything that might make someone trip, such as rocks or tools. Regularly check to see if handrails are loose or broken. Make sure that both sides of any steps have handrails. Any raised decks and porches should have guardrails on the edges. Have any leaves, snow, or ice cleared regularly. Use sand or salt on walking paths during winter. Clean up any spills in your garage right away. This includes oil or grease spills. What can I do in the bathroom? Use night lights. Install grab bars by the toilet and in the tub and shower. Do not use towel bars as grab bars. Use non-skid mats or decals in the tub or shower. If you need to sit down in the shower, use a plastic, non-slip stool. Keep the floor dry. Clean up any water that spills on the floor as soon as it happens. Remove soap buildup in the tub or shower regularly. Attach bath mats securely with double-sided non-slip rug tape. Do not have throw rugs and other things on the floor that can make you trip. What can I do in the bedroom? Use night lights. Make sure that you have a light by your bed that is easy to reach. Do not use any sheets or blankets that are too big for your bed. They should not hang  down onto the floor. Have a firm chair that has side arms. You can use this for support while you get dressed. Do not have throw rugs and other things on the floor that can make you trip. What can I do in the kitchen? Clean up any spills right away. Avoid walking on wet floors. Keep items that you use a lot in easy-to-reach places. If you need to reach something above you, use a strong step stool that has a grab bar. Keep electrical cords out of the way. Do not use floor polish or wax that makes floors slippery. If you must use wax, use non-skid floor wax. Do not have throw rugs and other things on the floor that can make you trip. What can I do with my stairs? Do not leave any items on the stairs. Make sure that there are handrails on both sides of the stairs and use them. Fix handrails that are broken or loose. Make sure that handrails are as long as the stairways. Check any carpeting to make sure that it is firmly attached to the stairs. Fix any carpet that is loose or worn. Avoid having throw rugs at the top or bottom of the stairs. If you do have throw rugs, attach them to the floor with carpet tape. Make sure that you have a light switch at the top of the stairs  and the bottom of the stairs. If you do not have them, ask someone to add them for you. What else can I do to help prevent falls? Wear shoes that: Do not have high heels. Have rubber bottoms. Are comfortable and fit you well. Are closed at the toe. Do not wear sandals. If you use a stepladder: Make sure that it is fully opened. Do not climb a closed stepladder. Make sure that both sides of the stepladder are locked into place. Ask someone to hold it for you, if possible. Clearly mark and make sure that you can see: Any grab bars or handrails. First and last steps. Where the edge of each step is. Use tools that help you move around (mobility aids) if they are needed. These  include: Canes. Walkers. Scooters. Crutches. Turn on the lights when you go into a dark area. Replace any light bulbs as soon as they burn out. Set up your furniture so you have a clear path. Avoid moving your furniture around. If any of your floors are uneven, fix them. If there are any pets around you, be aware of where they are. Review your medicines with your doctor. Some medicines can make you feel dizzy. This can increase your chance of falling. Ask your doctor what other things that you can do to help prevent falls. This information is not intended to replace advice given to you by your health care provider. Make sure you discuss any questions you have with your health care provider. Document Released: 07/11/2009 Document Revised: 02/20/2016 Document Reviewed: 10/19/2014 Elsevier Interactive Patient Education  2017 [employer].

## 2024-06-21 NOTE — Progress Notes (Signed)
 Subjective:   Amy Turner is a 89 y.o. female who presents for Medicare Annual (Subsequent) preventive examination.  Visit Complete: Virtual I connected with  Amy Turner on 06/21/24 by a audio enabled telemedicine application and verified that I am speaking with the correct person using two identifiers.  Patient Location: Home  Provider Location: Home Office  I discussed the limitations of evaluation and management by telemedicine. The patient expressed understanding and agreed to proceed.  Vital Signs: Because this visit was a virtual/telehealth visit, some criteria may be missing or patient reported. Any vitals not documented were not able to be obtained and vitals that have been documented are patient reported.    Cardiac Risk Factors include: advanced age (>45men, >59 women);hypertension;obesity (BMI >30kg/m2)     Objective:    Today's Vitals   06/21/24 1436  Weight: 146 lb (66.2 kg)  Height: 5' 2 (1.575 m)   Body mass index is 26.7 kg/m.     06/21/2024    2:36 PM 06/16/2023    1:42 PM 05/27/2022    9:59 AM 01/11/2022    1:44 PM 08/17/2021    8:40 AM 06/17/2020    1:41 PM 06/04/2017   10:03 AM  Advanced Directives  Does Patient Have a Medical Advance Directive? No Yes Yes Yes No No Yes   Type of Special educational needs teacher of [place];Living will Living will Living will   Living will;Healthcare Power of Attorney  Does patient want to make changes to medical advance directive?  No - Patient declined  No - Patient declined     Copy of Healthcare Power of Attorney in Chart?  Yes - validated most recent copy scanned in chart (See row information)     No - copy requested   Would patient like information on creating a medical advance directive? No - Patient declined    No - Patient declined       Data saved with a previous flowsheet row definition    Current Medications (verified) Outpatient Encounter Medications as of 06/21/2024  Medication Sig    amLODipine  (NORVASC ) 10 MG tablet Take 1 tablet (10 mg total) by mouth daily.   brinzolamide (AZOPT) 1 % ophthalmic suspension 1 drop 2 (two) times daily.   hydrochlorothiazide  (HYDRODIURIL ) 25 MG tablet TAKE 1 TABLET (25 MG TOTAL) BY MOUTH DAILY.   latanoprost (XALATAN) 0.005 % ophthalmic solution Place 1 drop into both eyes at bedtime.   losartan  (COZAAR ) 50 MG tablet Take 2 tablets (100 mg total) by mouth daily.   metoprolol  succinate (TOPROL -XL) 100 MG 24 hr tablet Take 1 tablet (100 mg total) by mouth daily. Take with or immediately following a meal.   Multiple Vitamins-Minerals (ICAPS AREDS FORMULA PO) Take 1 capsule by mouth 2 (two) times daily.    ROCKLATAN 0.02-0.005 % SOLN Apply 1 drop to eye at bedtime.   sertraline  (ZOLOFT ) 50 MG tablet Take 1 tablet (50 mg total) by mouth daily.   timolol (TIMOPTIC) 0.5 % ophthalmic solution    No facility-administered encounter medications on file as of 06/21/2024.    Allergies (verified) Atorvastatin and Prednisone    History: Past Medical History:  Diagnosis Date   Aortic insufficiency 09/2016   mild/mod aortic insufficiency, and mild mitral insufficiency.--Repeat echo 1 yr.   Baker's cyst of knee 02/25/2012   LE Venous Doppler May 30, '13 - non-vascular swelling/mass left popliteal fossa c/w Baker's cyst    CARCINOMA, BREAST, LEFT 1993   Lumpectomy, adjuvant XRT, then  tamoxifen.  Gets annual mammograms, no hx of abnormals--as of 07-27-2015, patient has chosen to stop getting screening mammograms.   Chronic renal insufficiency, stage III (moderate)    DEPRESSION, SITUATIONAL 03/26/2009   Qualifier: Diagnosis of  By: Harlow MD, Ozell BRAVO    GLAUCOMA 06/07/2007   Qualifier: Diagnosis of  By: Nickola CMA, [place]     HYPERLIPIDEMIA 06/07/2007   Qualifier: Diagnosis of  By: Nickola CMA, [place]     HYPERTENSION 06/07/2007   Qualifier: Diagnosis of  By: Nickola CMA, [place]     HYSTERECTOMY, HX OF 06/07/2007   Qualifier: Diagnosis of  By: Nickola  CMA, [place]     Personal history of surgery to other organs 06/07/2007   Centricity Description: BLADDER REPAIR, HX OF Qualifier: Diagnosis of  By: Nickola CMA, [place]   Centricity Description: LUMPECTOMY, BREAST, HX OF Qualifier: Diagnosis of  By: Nickola CMA, [place]     POSTMENOPAUSAL OSTEOPOROSIS 11/18/2010   Qualifier: Diagnosis of  By: Harlow MD, Ozell BRAVO    Prediabetes 12/2021   April 2023 a1c 6.1%   Past Surgical History:  Procedure Laterality Date   ABDOMINAL HYSTERECTOMY     Benign reasons per pt, but she can't recall exactly what dx.  Ovaries and tubes are still in.     BLADDER REPAIR     BREAST SURGERY  1993   lumpectomy w/adjuvant XRT   EYE SURGERY  07/26/2010   cataract surgery [facility]    TRANSTHORACIC ECHOCARDIOGRAM  10/09/2016   EF 60-65%, normal wall motion, grd I DD, mild/mod aortic regurg, mild mitral regurg.   Family History  Problem Relation Age of Onset   Hyperlipidemia Mother    Hypertension Mother    Cervical cancer Mother    Coronary artery disease Mother    Depression Father    Colon cancer Neg Hx    Diabetes Neg Hx    Social History   Socioeconomic History   Marital status: Widowed    Spouse name: Not on file   Number of children: 2   Years of education: 42   Highest education level: Not on file  Occupational History   Occupation: homemaker  Tobacco Use   Smoking status: Never   Smokeless tobacco: Never  Vaping Use   Vaping status: Never Used  Substance and Sexual Activity   Alcohol use: No   Drug use: No   Sexual activity: Never  Other Topics Concern   Not on file  Social History Narrative   Married 2049/07/26. 2 sons-'58, '57 oldest died CAD/MI; 2 grandchildren.  From [place], [place] originally.   Homemaker, retired from [employer].   Widowed in the spring of 2017.    Little gardening, handcrafts. ACP/End-of-life Issues: Does not want CPR, mechanical ventilation or heroic measures. She has a living will.   Never smoker.  No alcohol.                Social Drivers of Corporate investment banker Strain: Low Risk  (06/21/2024)   Overall Financial Resource Strain (CARDIA)    Difficulty of Paying Living Expenses: Not hard at all  Food Insecurity: No Food Insecurity (06/21/2024)   Hunger Vital Sign    Worried About Running Out of Food in the Last Year: Never true    Ran Out of Food in the Last Year: Never true  Transportation Needs: No Transportation Needs (06/21/2024)   PRAPARE - Administrator, Civil Service (Medical): No    Lack of Transportation (  Non-Medical): No  Physical Activity: Insufficiently Active (06/21/2024)   Exercise Vital Sign    Days of Exercise per Week: 3 days    Minutes of Exercise per Session: 10 min  Stress: No Stress Concern Present (06/21/2024)   [employer] of Occupational Health - Occupational Stress Questionnaire    Feeling of Stress: Not at all  Social Connections: Moderately Isolated (06/21/2024)   Social Connection and Isolation Panel    Frequency of Communication with Friends and Family: More than three times a week    Frequency of Social Gatherings with Friends and Family: Twice a week    Attends Religious Services: More than 4 times per year    Active Member of [employer] or Organizations: No    Attends Banker Meetings: Never    Marital Status: Widowed    Tobacco Counseling Counseling given: Not Answered   Clinical Intake:  Pre-visit preparation completed: Yes  Pain : No/denies pain     Diabetes: No  How often do you need to have someone help you when you read instructions, pamphlets, or other written materials from your doctor or pharmacy?: 1 - Never  Interpreter Needed?: No  Information entered by :: Mliss Graff LPN   Activities of Daily Living    06/21/2024    2:47 PM  In your present state of health, do you have any difficulty performing the following activities:  Hearing? 0  Vision? 0  Difficulty concentrating or making decisions? 0  Walking or  climbing stairs? 0  Dressing or bathing? 0  Doing errands, shopping? 0  Preparing Food and eating ? N  Using the Toilet? N  In the past six months, have you accidently leaked urine? N  Do you have problems with loss of bowel control? N  Managing your Medications? N  Managing your Finances? N  Housekeeping or managing your Housekeeping? N    Patient Care Team: Candise Aleene DEL, MD as PCP - General (Family Medicine) Patrcia Sharper, MD as Consulting Physician (Ophthalmology) Henri Martinis, DDS (Dental General Practice)  Indicate any recent Medical Services you may have received from other than [facility] providers in the past year (date may be approximate).     Assessment:   This is a routine wellness examination for Amy Turner.  Hearing/Vision screen Hearing Screening - Comments:: No trouble  hearing Vision Screening - Comments:: Up to date Unsure of name   Goals Addressed               This Visit's Progress     patient (pt-stated)   On track     Maintain current health      Patient Stated   On track     Maintain current lifestyle      Patient Stated   On track     Keep on living       Patient Stated        Maintain current lifestyle       Depression Screen    06/21/2024    2:40 PM 04/17/2024    3:00 PM 06/16/2023    1:41 PM 02/26/2023    1:53 PM 11/20/2022    1:58 PM 08/12/2022    3:58 PM 05/27/2022    9:58 AM  PHQ 2/9 Scores  PHQ - 2 Score 0 0 0 0 6 0 0  PHQ- 9 Score 0   0 19      Fall Risk    06/21/2024    2:34 PM 04/17/2024  3:00 PM 06/16/2023    1:43 PM 02/26/2023    1:53 PM 11/20/2022    1:57 PM  Fall Risk   Falls in the past year? 0 0 0 0 1  Number falls in past yr: 0 0 0  0  Injury with Fall? 0 0 0  0  Risk for fall due to :  History of fall(s) Impaired vision    Follow up Falls evaluation completed;Education provided;Falls prevention discussed Falls evaluation completed Falls prevention discussed  Falls evaluation completed    MEDICARE RISK AT  HOME: Medicare Risk at Home Any stairs in or around the home?: Yes If so, are there any without handrails?: No Home free of loose throw rugs in walkways, pet beds, electrical cords, etc?: Yes Adequate lighting in your home to reduce risk of falls?: Yes Life alert?: No Use of a cane, walker or w/c?: No Grab bars in the bathroom?: Yes Shower chair or bench in shower?: Yes Elevated toilet seat or a handicapped toilet?: Yes  TIMED UP AND GO:  Was the test performed?  No    Cognitive Function:    06/04/2017   10:05 AM  MMSE - Mini Mental State Exam  Orientation to time 5   Orientation to Place 5   Registration 3   Attention/ Calculation 5   Recall 2   Language- name 2 objects 2   Language- repeat 1  Language- follow 3 step command 3   Language- read & follow direction 1   Write a sentence 1   Copy design 1   Total score 29      Data saved with a previous flowsheet row definition        06/21/2024    2:37 PM 06/16/2023    1:44 PM 05/27/2022   10:01 AM 05/22/2021   12:42 PM  6CIT Screen  What Year? 0 points 0 points 0 points 0 points  What month? 0 points 0 points 0 points 0 points  What time? 0 points 0 points 0 points 0 points  Count back from 20 0 points 0 points 0 points 0 points  Months in reverse 0 points 0 points 0 points 0 points  Repeat phrase 2 points 0 points 0 points 0 points  Total Score 2 points 0 points 0 points 0 points    Immunizations Immunization History  Administered Date(s) Administered   Fluad Quad(high Dose 65+) 08/16/2019, 07/01/2020   Fluad Trivalent(High Dose 65+) 09/01/2023   INFLUENZA, HIGH DOSE SEASONAL PF 08/14/2013, 08/19/2016, 07/09/2017, 08/05/2018, 05/31/2021   Influenza Whole 06/25/2008, 07/29/2009   Influenza,inj,Quad PF,6+ Mos 06/25/2014   Influenza-Unspecified 07/09/2015   Pneumococcal Conjugate-13 01/31/2015   Pneumococcal Polysaccharide-23 08/28/2004   Td 11/14/2010   Tdap 03/18/2021    TDAP status: Up to date  Flu  Vaccine status: Due, Education has been provided regarding the importance of this vaccine. Advised may receive this vaccine at local pharmacy or Health Dept. Aware to provide a copy of the vaccination record if obtained from local pharmacy or Health Dept. Verbalized acceptance and understanding.  Pneumococcal vaccine status: Due, Education has been provided regarding the importance of this vaccine. Advised may receive this vaccine at local pharmacy or Health Dept. Aware to provide a copy of the vaccination record if obtained from local pharmacy or Health Dept. Verbalized acceptance and understanding.  Covid-19 vaccine status: Information provided on how to obtain vaccines.   Qualifies for Shingles Vaccine? Yes   Zostavax completed No   Shingrix Completed?: No.  Education has been provided regarding the importance of this vaccine. Patient has been advised to call insurance company to determine out of pocket expense if they have not yet received this vaccine. Advised may also receive vaccine at local pharmacy or Health Dept. Verbalized acceptance and understanding.  Screening Tests Health Maintenance  Topic Date Due   COVID-19 Vaccine (1) Never done   Influenza Vaccine  04/28/2024   Zoster Vaccines- Shingrix (1 of 2) 07/18/2024 (Originally 06/20/1945)   Medicare Annual Wellness (AWV)  06/21/2025   DTaP/Tdap/Td (3 - Td or Tdap) 03/19/2031   Pneumococcal Vaccine: 50+ Years  Completed   DEXA SCAN  Completed   HPV VACCINES  Aged Out   Meningococcal B Vaccine  Aged Out    Health Maintenance  Health Maintenance Due  Topic Date Due   COVID-19 Vaccine (1) Never done   Influenza Vaccine  04/28/2024    Colorectal cancer screening: No longer required.   Mammogram status: No longer required due to  .    Lung Cancer Screening: (Low Dose CT Chest recommended if Age 48-80 years, 20 pack-year currently smoking OR have quit w/in 15years.) does not qualify.   Lung Cancer Screening Referral:    Additional Screening:  Hepatitis C Screening: does not qualify;  Vision Screening: Recommended annual ophthalmology exams for early detection of glaucoma and other disorders of the eye. Is the patient up to date with their annual eye exam?  Yes  Who is the provider or what is the name of the office in which the patient attends annual eye exams? Unsure of name If pt is not established with a provider, would they like to be referred to a provider to establish care? No .   Dental Screening: Recommended annual dental exams for proper oral hygiene    Community Resource Referral / Chronic Care Management: CRR required this visit?  No   CCM required this visit?  No     Plan:     I have personally reviewed and noted the following in the patient's chart:   Medical and social history Use of alcohol, tobacco or illicit drugs  Current medications and supplements including opioid prescriptions. Patient is not currently taking opioid prescriptions. Functional ability and status Nutritional status Physical activity Advanced directives List of other physicians Hospitalizations, surgeries, and ER visits in previous 12 months Vitals Screenings to include cognitive, depression, and falls Referrals and appointments  In addition, I have reviewed and discussed with patient certain preventive protocols, quality metrics, and best practice recommendations. A written personalized care plan for preventive services as well as general preventive health recommendations were provided to patient.     Mliss Graff, LPN   [id number]   After Visit Summary: (MyChart) Due to this being a telephonic visit, the after visit summary with patients personalized plan was offered to patient via MyChart   Nurse Notes:

## 2024-06-26 ENCOUNTER — Other Ambulatory Visit: Payer: Self-pay | Admitting: Family Medicine

## 2024-07-11 ENCOUNTER — Telehealth: Payer: Self-pay

## 2024-07-11 NOTE — Telephone Encounter (Signed)
 Copied from CRM [id number]. Topic: Clinical - Medication Question >> Jul 11, 2024  4:05 PM Shereese L wrote: Reason for CRM: patient son called in and stated that the pharmacy ran out of 100mg  metoprolol  succinate (TOPROL -XL) 100 MG 24 hr tablet.. Patients son wants to know if he can give her 4x 25mg  tablets a day to equal up to the 100 she is suppose to be taking Please call son with followup info #[phone]

## 2024-07-12 NOTE — Telephone Encounter (Signed)
 Patients son returned call to [facility] was relayed from Dr Candise .Patients son verbalized understanding.

## 2024-07-12 NOTE — Telephone Encounter (Signed)
 Yes that is fine

## 2024-07-12 NOTE — Telephone Encounter (Signed)
 Left pts son a VM to return my call.

## 2024-08-23 ENCOUNTER — Encounter: Payer: Self-pay | Admitting: Family Medicine

## 2024-08-23 ENCOUNTER — Ambulatory Visit: Admitting: Family Medicine

## 2024-08-23 VITALS — BP 149/64 | HR 65 | Temp 97.3°F | Ht 62.0 in | Wt 144.0 lb

## 2024-08-23 DIAGNOSIS — L989 Disorder of the skin and subcutaneous tissue, unspecified: Secondary | ICD-10-CM | POA: Diagnosis not present

## 2024-08-23 DIAGNOSIS — I10 Essential (primary) hypertension: Secondary | ICD-10-CM | POA: Diagnosis not present

## 2024-08-23 DIAGNOSIS — N2889 Other specified disorders of kidney and ureter: Secondary | ICD-10-CM

## 2024-08-23 DIAGNOSIS — R7303 Prediabetes: Secondary | ICD-10-CM | POA: Diagnosis not present

## 2024-08-23 DIAGNOSIS — Z23 Encounter for immunization: Secondary | ICD-10-CM

## 2024-08-23 MED ORDER — HYDROCHLOROTHIAZIDE 25 MG PO TABS: 25.0000 mg | ORAL_TABLET | Freq: Every day | ORAL | 3 refills | Status: AC

## 2024-08-23 MED ORDER — METOPROLOL SUCCINATE ER 100 MG PO TB24: 100.0000 mg | ORAL_TABLET | Freq: Every day | ORAL | 3 refills | Status: AC

## 2024-08-23 NOTE — Progress Notes (Signed)
 OFFICE VISIT  08/23/2024  CC: No chief complaint on file.   Patient is a 89 y.o. female who presents accompanied by her son Franky for follow-up hypertension and prediabetes.  INTERIM HX: She feels well. She has a large growth on her left leg since I last saw her.  Systolic blood pressures in the 130s to 160s.  Nothing less than 120. Diastolics always in the 60s.  ROS --> no fevers, no CP, no SOB, no wheezing, no cough, no dizziness, no HAs,  no melena/hematochezia.  No polyuria or polydipsia.  No myalgias or arthralgias.  No focal weakness, paresthesias, or tremors.  No acute vision or hearing abnormalities.  No dysuria or unusual/new urinary urgency or frequency.  No recent changes in lower legs. No n/v/d or abd pain.  No palpitations.     Past Medical History:  Diagnosis Date   Aortic insufficiency 09/2016   mild/mod aortic insufficiency, and mild mitral insufficiency.--Repeat echo 1 yr.   Baker's cyst of knee 02/25/2012   LE Venous Doppler May 30, '13 - non-vascular swelling/mass left popliteal fossa c/w Baker's cyst    CARCINOMA, BREAST, LEFT 1993   Lumpectomy, adjuvant XRT, then tamoxifen.  Gets annual mammograms, no hx of abnormals--as of 2016, patient has chosen to stop getting screening mammograms.   Chronic renal insufficiency, stage III (moderate)    DEPRESSION, SITUATIONAL 03/26/2009   Qualifier: Diagnosis of  By: Harlow MD, Ozell BRAVO    GLAUCOMA 06/07/2007   Qualifier: Diagnosis of  By: Nickola CMA, [place]     HYPERLIPIDEMIA 06/07/2007   Qualifier: Diagnosis of  By: Nickola CMA, [place]     HYPERTENSION 06/07/2007   Qualifier: Diagnosis of  By: Nickola CMA, [place]     HYSTERECTOMY, HX OF 06/07/2007   Qualifier: Diagnosis of  By: Nickola CMA, [place]     Personal history of surgery to other organs 06/07/2007   Centricity Description: BLADDER REPAIR, HX OF Qualifier: Diagnosis of  By: Nickola CMA, [place]   Centricity Description: LUMPECTOMY, BREAST, HX OF Qualifier:  Diagnosis of  By: Nickola CMA, [place]     POSTMENOPAUSAL OSTEOPOROSIS 11/18/2010   Qualifier: Diagnosis of  By: Harlow MD, Ozell BRAVO    Prediabetes 12/2021   April 2023 a1c 6.1%    Past Surgical History:  Procedure Laterality Date   ABDOMINAL HYSTERECTOMY     Benign reasons per pt, but she can't recall exactly what dx.  Ovaries and tubes are still in.     BLADDER REPAIR     BREAST SURGERY  1993   lumpectomy w/adjuvant XRT   EYE SURGERY  2011   cataract surgery [facility]    TRANSTHORACIC ECHOCARDIOGRAM  10/09/2016   EF 60-65%, normal wall motion, grd I DD, mild/mod aortic regurg, mild mitral regurg.    Outpatient Medications Prior to Visit  Medication Sig Dispense Refill   amLODipine  (NORVASC ) 10 MG tablet Take 1 tablet (10 mg total) by mouth daily. 90 tablet 3   brinzolamide (AZOPT) 1 % ophthalmic suspension 1 drop 2 (two) times daily.     hydrochlorothiazide  (HYDRODIURIL ) 25 MG tablet TAKE 1 TABLET (25 MG TOTAL) BY MOUTH DAILY. 90 tablet 0   latanoprost (XALATAN) 0.005 % ophthalmic solution Place 1 drop into both eyes at bedtime.     losartan  (COZAAR ) 50 MG tablet Take 2 tablets (100 mg total) by mouth daily. 180 tablet 1   metoprolol  succinate (TOPROL -XL) 100 MG 24 hr tablet Take 1 tablet (100 mg total) by mouth daily. Take with or  immediately following a meal. 90 tablet 3   Multiple Vitamins-Minerals (ICAPS AREDS FORMULA PO) Take 1 capsule by mouth 2 (two) times daily.      ROCKLATAN 0.02-0.005 % SOLN Apply 1 drop to eye at bedtime.     sertraline  (ZOLOFT ) 50 MG tablet Take 1 tablet (50 mg total) by mouth daily. 90 tablet 3   timolol (TIMOPTIC) 0.5 % ophthalmic solution      No facility-administered medications prior to visit.    Allergies  Allergen Reactions   Atorvastatin Other (See Comments)    Leg myalgias   Prednisone  Other (See Comments)    Various cognitive/psychiatric side effects    Review of Systems As per HPI  PE:    08/23/2024    2:53 PM 08/23/2024     2:49 PM 06/21/2024    2:36 PM  Vitals with BMI  Height  5' 2 5' 2  Weight  144 lbs 146 lbs  BMI  26.33 26.7  Systolic 149 162   Diastolic 64 71   Pulse  65      Physical Exam  Gen: Alert, well appearing.  Patient is oriented to person, place, time, and situation. AFFECT: pleasant, lucid thought and speech. Skin: Left leg posteromedial aspect of the thigh with a large pedunculated globular mass that is 2 cm diameter.  Heterogeneous epithelium consisting of some flaky texture as well as irregular papules.  Soft, nontender.  Very rubbery texture. Around the lesion there is several centimeters of a patch of pink scaly rash.  LABS:  Last CBC Lab Results  Component Value Date   WBC 9.0 08/10/2022   HGB 12.4 08/10/2022   HCT 38.2 08/10/2022   MCV 95.8 08/10/2022   MCH 31.1 08/17/2021   RDW 13.3 08/10/2022   PLT 242.0 08/10/2022   Last metabolic panel Lab Results  Component Value Date   GLUCOSE 129 (H) 05/01/2024   NA 142 05/01/2024   K 4.4 05/01/2024   CL 105 05/01/2024   CO2 27 05/01/2024   BUN 23 05/01/2024   CREATININE 1.04 05/01/2024   GFR 45.57 (L) 05/01/2024   CALCIUM  9.6 05/01/2024   PROT 6.8 08/17/2021   ALBUMIN 3.9 08/17/2021   BILITOT 0.6 08/17/2021   ALKPHOS 98 08/17/2021   AST 14 (L) 08/17/2021   ALT 7 08/17/2021   ANIONGAP 11 08/17/2021   Last lipids Lab Results  Component Value Date   CHOL 176 01/17/2014   HDL 57.60 01/17/2014   LDLCALC 91 01/17/2014   LDLDIRECT 124.7 11/14/2010   TRIG 139.0 01/17/2014   CHOLHDL 3 01/17/2014   Last hemoglobin A1c Lab Results  Component Value Date   HGBA1C 6.0 (H) 03/06/2024   Last thyroid  functions Lab Results  Component Value Date   TSH 3.17 09/29/2016   IMPRESSION AND PLAN:   1 hypertension, well-controlled on amlodipine  10 mg a day, HCTZ 25 mg a day, losartan  100 mg a day, and Toprol -XL 100 mg a day. Basic metabolic panel today.  2.  Chronic renal insufficiency stage IIIa. Avoid NSAIDs.  She  hydrates well. Basic metabolic panel today.  3.  Prediabetes. Last hemoglobin A1c about 5 months ago was 6.0%. Repeat hemoglobin A1c today.  4.  Large left leg skin lesion/pedunculated nodule. She will return and I will do an excision and sent to pathology. Will refer to dermatology as well.  An After Visit Summary was printed and given to the patient.  FOLLOW UP: Next week for skin lesion removal  Signed:  Gerlene  Draya Felker, MD           08/23/2024

## 2024-08-24 LAB — BASIC METABOLIC PANEL WITH GFR
BUN: 22 mg/dL (ref 7–25)
CO2: 26 mmol/L (ref 20–32)
Calcium: 9.3 mg/dL (ref 8.6–10.4)
Chloride: 106 mmol/L (ref 98–110)
Creat: 0.95 mg/dL (ref 0.60–0.95)
Glucose, Bld: 125 mg/dL — ABNORMAL HIGH (ref 65–99)
Potassium: 3.9 mmol/L (ref 3.5–5.3)
Sodium: 141 mmol/L (ref 135–146)
eGFR: 55 mL/min/1.73m2 — ABNORMAL LOW (ref >=60)

## 2024-08-24 LAB — HEMOGLOBIN A1C
Hgb A1c MFr Bld: 6 % — ABNORMAL HIGH (ref <5.7)
Mean Plasma Glucose: 126 mg/dL
eAG (mmol/L): 7 mmol/L

## 2024-08-26 ENCOUNTER — Ambulatory Visit: Payer: Self-pay | Admitting: Family Medicine

## 2024-08-28 ENCOUNTER — Encounter: Payer: Self-pay | Admitting: Family Medicine

## 2024-08-28 ENCOUNTER — Ambulatory Visit (INDEPENDENT_AMBULATORY_CARE_PROVIDER_SITE_OTHER): Admitting: Family Medicine

## 2024-08-28 VITALS — BP 158/67 | HR 67 | Temp 98.1°F | Ht 62.0 in | Wt 144.0 lb

## 2024-08-28 DIAGNOSIS — R2242 Localized swelling, mass and lump, left lower limb: Secondary | ICD-10-CM

## 2024-08-28 DIAGNOSIS — C44729 Squamous cell carcinoma of skin of left lower limb, including hip: Secondary | ICD-10-CM | POA: Diagnosis not present

## 2024-08-28 NOTE — Progress Notes (Signed)
 OFFICE VISIT  08/28/2024  CC:  Chief Complaint  Patient presents with   Procedure    Cyst removal    Patient is a 89 y.o. female who presents for excision of mass on left leg.   HPI: Painless nodular pedunculated skin growth back of left thigh for at least the last few months.  Enlarging. Here for excision today.  Past Medical History:  Diagnosis Date   Aortic insufficiency 09/2016   mild/mod aortic insufficiency, and mild mitral insufficiency.--Repeat echo 1 yr.   Baker's cyst of knee 02/25/2012   LE Venous Doppler May 30, '13 - non-vascular swelling/mass left popliteal fossa c/w Baker's cyst    CARCINOMA, BREAST, LEFT 1993   Lumpectomy, adjuvant XRT, then tamoxifen.  Gets annual mammograms, no hx of abnormals--as of 2016, patient has chosen to stop getting screening mammograms.   Chronic renal insufficiency, stage III (moderate)    DEPRESSION, SITUATIONAL 03/26/2009   Qualifier: Diagnosis of  By: Harlow MD, Ozell BRAVO    GLAUCOMA 06/07/2007   Qualifier: Diagnosis of  By: Nickola CMA, [place]     HYPERLIPIDEMIA 06/07/2007   Qualifier: Diagnosis of  By: Nickola CMA, [place]     HYPERTENSION 06/07/2007   Qualifier: Diagnosis of  By: Nickola CMA, [place]     HYSTERECTOMY, HX OF 06/07/2007   Qualifier: Diagnosis of  By: Nickola CMA, [place]     Personal history of surgery to other organs 06/07/2007   Centricity Description: BLADDER REPAIR, HX OF Qualifier: Diagnosis of  By: Nickola CMA, [place]   Centricity Description: LUMPECTOMY, BREAST, HX OF Qualifier: Diagnosis of  By: Nickola CMA, [place]     POSTMENOPAUSAL OSTEOPOROSIS 11/18/2010   Qualifier: Diagnosis of  By: Harlow MD, Ozell BRAVO    Prediabetes 12/2021   April 2023 a1c 6.1%    Past Surgical History:  Procedure Laterality Date   ABDOMINAL HYSTERECTOMY     Benign reasons per pt, but she can't recall exactly what dx.  Ovaries and tubes are still in.     BLADDER REPAIR     BREAST SURGERY  1993   lumpectomy w/adjuvant XRT    EYE SURGERY  2011   cataract surgery [facility]    TRANSTHORACIC ECHOCARDIOGRAM  10/09/2016   EF 60-65%, normal wall motion, grd I DD, mild/mod aortic regurg, mild mitral regurg.    Outpatient Medications Prior to Visit  Medication Sig Dispense Refill   amLODipine  (NORVASC ) 10 MG tablet Take 1 tablet (10 mg total) by mouth daily. 90 tablet 3   brinzolamide (AZOPT) 1 % ophthalmic suspension 1 drop 2 (two) times daily.     hydrochlorothiazide  (HYDRODIURIL ) 25 MG tablet Take 1 tablet (25 mg total) by mouth daily. 90 tablet 3   latanoprost (XALATAN) 0.005 % ophthalmic solution Place 1 drop into both eyes at bedtime.     losartan  (COZAAR ) 50 MG tablet Take 2 tablets (100 mg total) by mouth daily. 180 tablet 1   metoprolol  succinate (TOPROL -XL) 100 MG 24 hr tablet Take 1 tablet (100 mg total) by mouth daily. Take with or immediately following a meal. 90 tablet 3   Multiple Vitamins-Minerals (ICAPS AREDS FORMULA PO) Take 1 capsule by mouth 2 (two) times daily.      ROCKLATAN 0.02-0.005 % SOLN Apply 1 drop to eye at bedtime.     sertraline  (ZOLOFT ) 50 MG tablet Take 1 tablet (50 mg total) by mouth daily. 90 tablet 3   timolol (TIMOPTIC) 0.5 % ophthalmic solution      No facility-administered medications  prior to visit.    Allergies  Allergen Reactions   Atorvastatin Other (See Comments)    Leg myalgias   Prednisone  Other (See Comments)    Various cognitive/psychiatric side effects    Review of Systems  As per HPI  PE:    08/28/2024    4:09 PM 08/28/2024    4:08 PM 08/23/2024    2:53 PM  Vitals with BMI  Height  5' 2   Weight  144 lbs   BMI  26.33   Systolic 158 158 850  Diastolic 67 70 64  Pulse  67    Physical Exam  Skin: Left leg posteromedial aspect of the thigh with a large pedunculated globular mass that is 2 cm diameter.  Heterogeneous epithelium consisting of some flaky texture as well as irregular papules.  Soft, nontender.  Very rubbery texture. Around the lesion  there is several centimeters of a patch of pink scaly rash.   LABS:  none  IMPRESSION AND PLAN:  Skin mass, left leg. Excised and sent to pathology today.  Procedure: skin lesion removal.  Consent obtained.  Area prepped and draped after being infiltrated with 7 mL of 1% lidocaine with epi.  Sterile technique utilized to excise the lesion entirely by using scalpel and cutting the base (2 cm diameter base).  Silver nitrate stick x 2 used for hemostasis.  Skin closure was not required.  No immediate complications.  Pt tolerated procedure well. Specimen sent to pathology.  Wound care instructions discussed.  An After Visit Summary was printed and given to the patient.  FOLLOW UP: Return in about 1 week (around 09/04/2024) for Recheck wound.  Signed:  Gerlene Hockey, MD           08/28/2024

## 2024-08-31 LAB — DERMATOLOGY PATHOLOGY

## 2024-09-01 ENCOUNTER — Ambulatory Visit: Payer: Self-pay | Admitting: Family Medicine

## 2024-09-01 ENCOUNTER — Ambulatory Visit: Admitting: Family Medicine

## 2024-09-04 ENCOUNTER — Ambulatory Visit: Admitting: Family Medicine

## 2024-09-04 NOTE — Progress Notes (Deleted)
 OFFICE VISIT  09/04/2024  CC: No chief complaint on file.   Patient is a 89 y.o. female who presents for 1 week follow-up excision of left leg skin nodule, recheck wound.  INTERIM HX: ***  Past Medical History:  Diagnosis Date   Aortic insufficiency 09/2016   mild/mod aortic insufficiency, and mild mitral insufficiency.--Repeat echo 1 yr.   Baker's cyst of knee 02/25/2012   LE Venous Doppler May 30, '13 - non-vascular swelling/mass left popliteal fossa c/w Baker's cyst    CARCINOMA, BREAST, LEFT 1993   Lumpectomy, adjuvant XRT, then tamoxifen.  Gets annual mammograms, no hx of abnormals--as of 2016, patient has chosen to stop getting screening mammograms.   Chronic renal insufficiency, stage III (moderate)    DEPRESSION, SITUATIONAL 03/26/2009   Qualifier: Diagnosis of  By: Harlow MD, Ozell BRAVO    GLAUCOMA 06/07/2007   Qualifier: Diagnosis of  By: Nickola CMA, [place]     HYPERLIPIDEMIA 06/07/2007   Qualifier: Diagnosis of  By: Nickola CMA, [place]     HYPERTENSION 06/07/2007   Qualifier: Diagnosis of  By: Nickola CMA, [place]     HYSTERECTOMY, HX OF 06/07/2007   Qualifier: Diagnosis of  By: Nickola CMA, [place]     Personal history of surgery to other organs 06/07/2007   Centricity Description: BLADDER REPAIR, HX OF Qualifier: Diagnosis of  By: Nickola CMA, [place]   Centricity Description: LUMPECTOMY, BREAST, HX OF Qualifier: Diagnosis of  By: Nickola CMA, [place]     POSTMENOPAUSAL OSTEOPOROSIS 11/18/2010   Qualifier: Diagnosis of  By: Harlow MD, Ozell BRAVO    Prediabetes 12/2021   April 2023 a1c 6.1%    Past Surgical History:  Procedure Laterality Date   ABDOMINAL HYSTERECTOMY     Benign reasons per pt, but she can't recall exactly what dx.  Ovaries and tubes are still in.     BLADDER REPAIR     BREAST SURGERY  1993   lumpectomy w/adjuvant XRT   EYE SURGERY  2011   cataract surgery [facility]    TRANSTHORACIC ECHOCARDIOGRAM  10/09/2016   EF 60-65%, normal wall motion, grd I  DD, mild/mod aortic regurg, mild mitral regurg.    Outpatient Medications Prior to Visit  Medication Sig Dispense Refill   amLODipine  (NORVASC ) 10 MG tablet Take 1 tablet (10 mg total) by mouth daily. 90 tablet 3   brinzolamide (AZOPT) 1 % ophthalmic suspension 1 drop 2 (two) times daily.     hydrochlorothiazide  (HYDRODIURIL ) 25 MG tablet Take 1 tablet (25 mg total) by mouth daily. 90 tablet 3   latanoprost (XALATAN) 0.005 % ophthalmic solution Place 1 drop into both eyes at bedtime.     losartan  (COZAAR ) 50 MG tablet Take 2 tablets (100 mg total) by mouth daily. 180 tablet 1   metoprolol  succinate (TOPROL -XL) 100 MG 24 hr tablet Take 1 tablet (100 mg total) by mouth daily. Take with or immediately following a meal. 90 tablet 3   Multiple Vitamins-Minerals (ICAPS AREDS FORMULA PO) Take 1 capsule by mouth 2 (two) times daily.      ROCKLATAN 0.02-0.005 % SOLN Apply 1 drop to eye at bedtime.     sertraline  (ZOLOFT ) 50 MG tablet Take 1 tablet (50 mg total) by mouth daily. 90 tablet 3   timolol (TIMOPTIC) 0.5 % ophthalmic solution      No facility-administered medications prior to visit.    Allergies  Allergen Reactions   Atorvastatin Other (See Comments)    Leg myalgias   Prednisone  Other (See Comments)  Various cognitive/psychiatric side effects    Review of Systems As per HPI  PE:    08/28/2024    4:09 PM 08/28/2024    4:08 PM 08/23/2024    2:53 PM  Vitals with BMI  Height  5' 2   Weight  144 lbs   BMI  26.33   Systolic 158 158 850  Diastolic 67 70 64  Pulse  67      Physical Exam  ***  LABS:  Last metabolic panel Lab Results  Component Value Date   GLUCOSE 125 (H) 08/23/2024   NA 141 08/23/2024   K 3.9 08/23/2024   CL 106 08/23/2024   CO2 26 08/23/2024   BUN 22 08/23/2024   CREATININE 0.95 08/23/2024   EGFR 55 (L) 08/23/2024   CALCIUM  9.3 08/23/2024   PROT 6.8 08/17/2021   ALBUMIN 3.9 08/17/2021   BILITOT 0.6 08/17/2021   ALKPHOS 98 08/17/2021   AST  14 (L) 08/17/2021   ALT 7 08/17/2021   ANIONGAP 11 08/17/2021   IMPRESSION AND PLAN:  No problem-specific Assessment & Plan notes found for this encounter.   An After Visit Summary was printed and given to the patient.  FOLLOW UP: No follow-ups on file.  Signed:  Gerlene Hockey, MD           09/04/2024

## 2024-10-25 ENCOUNTER — Other Ambulatory Visit: Payer: Self-pay | Admitting: Family Medicine

## 2025-06-27 ENCOUNTER — Ambulatory Visit
